# Patient Record
Sex: Female | Born: 1945 | Race: White | Hispanic: No | Marital: Married | Smoking: Never smoker
Health system: Southern US, Community
[De-identification: ages and names within clinical notes are randomized; demographics above are authoritative.]

## PROBLEM LIST (undated history)

## (undated) DIAGNOSIS — E11621 Type 2 diabetes mellitus with foot ulcer: Secondary | ICD-10-CM

## (undated) DIAGNOSIS — C44300 Unspecified malignant neoplasm of skin of unspecified part of face: Secondary | ICD-10-CM

## (undated) DIAGNOSIS — Z9289 Personal history of other medical treatment: Secondary | ICD-10-CM

## (undated) DIAGNOSIS — F329 Major depressive disorder, single episode, unspecified: Secondary | ICD-10-CM

## (undated) DIAGNOSIS — E669 Obesity, unspecified: Secondary | ICD-10-CM

## (undated) DIAGNOSIS — Z9889 Other specified postprocedural states: Secondary | ICD-10-CM

## (undated) DIAGNOSIS — I2510 Atherosclerotic heart disease of native coronary artery without angina pectoris: Secondary | ICD-10-CM

## (undated) DIAGNOSIS — I10 Essential (primary) hypertension: Secondary | ICD-10-CM

## (undated) DIAGNOSIS — I4891 Unspecified atrial fibrillation: Secondary | ICD-10-CM

## (undated) DIAGNOSIS — R4182 Altered mental status, unspecified: Secondary | ICD-10-CM

## (undated) DIAGNOSIS — L97509 Non-pressure chronic ulcer of other part of unspecified foot with unspecified severity: Secondary | ICD-10-CM

## (undated) DIAGNOSIS — E119 Type 2 diabetes mellitus without complications: Secondary | ICD-10-CM

## (undated) DIAGNOSIS — F419 Anxiety disorder, unspecified: Secondary | ICD-10-CM

## (undated) DIAGNOSIS — I509 Heart failure, unspecified: Secondary | ICD-10-CM

## (undated) DIAGNOSIS — G4730 Sleep apnea, unspecified: Secondary | ICD-10-CM

## (undated) DIAGNOSIS — I350 Nonrheumatic aortic (valve) stenosis: Secondary | ICD-10-CM

## (undated) DIAGNOSIS — G629 Polyneuropathy, unspecified: Secondary | ICD-10-CM

## (undated) DIAGNOSIS — E785 Hyperlipidemia, unspecified: Secondary | ICD-10-CM

## (undated) DIAGNOSIS — F32A Depression, unspecified: Secondary | ICD-10-CM

## (undated) DIAGNOSIS — E7800 Pure hypercholesterolemia, unspecified: Secondary | ICD-10-CM

## (undated) HISTORY — DX: Sleep apnea, unspecified: G47.30

## (undated) HISTORY — PX: TENOTOMY: SHX397

## (undated) HISTORY — DX: Anxiety disorder, unspecified: F41.9

## (undated) HISTORY — DX: Unspecified malignant neoplasm of skin of unspecified part of face: C44.300

## (undated) HISTORY — DX: Polyneuropathy, unspecified: G62.9

## (undated) HISTORY — DX: Atherosclerotic heart disease of native coronary artery without angina pectoris: I25.10

## (undated) HISTORY — PX: SKIN CANCER EXCISION: SHX779

## (undated) HISTORY — DX: Nonrheumatic aortic (valve) stenosis: I35.0

## (undated) HISTORY — DX: Non-pressure chronic ulcer of other part of unspecified foot with unspecified severity: L97.509

## (undated) HISTORY — DX: Type 2 diabetes mellitus with foot ulcer: E11.621

## (undated) HISTORY — DX: Personal history of other medical treatment: Z92.89

## (undated) HISTORY — DX: Other specified postprocedural states: Z98.890

## (undated) HISTORY — DX: Depression, unspecified: F32.A

## (undated) HISTORY — DX: Hyperlipidemia, unspecified: E78.5

## (undated) HISTORY — DX: Altered mental status, unspecified: R41.82

## (undated) HISTORY — DX: Major depressive disorder, single episode, unspecified: F32.9

---

## 1999-07-02 ENCOUNTER — Other Ambulatory Visit: Admission: RE | Admit: 1999-07-02 | Discharge: 1999-07-02 | Payer: Self-pay | Admitting: *Deleted

## 1999-10-29 ENCOUNTER — Encounter (INDEPENDENT_AMBULATORY_CARE_PROVIDER_SITE_OTHER): Payer: Self-pay | Admitting: Specialist

## 1999-10-29 ENCOUNTER — Encounter: Payer: Self-pay | Admitting: Gastroenterology

## 1999-10-29 ENCOUNTER — Ambulatory Visit (HOSPITAL_COMMUNITY): Admission: RE | Admit: 1999-10-29 | Discharge: 1999-10-29 | Payer: Self-pay | Admitting: Gastroenterology

## 2003-01-04 ENCOUNTER — Other Ambulatory Visit: Admission: RE | Admit: 2003-01-04 | Discharge: 2003-01-04 | Payer: Self-pay | Admitting: Obstetrics and Gynecology

## 2003-09-13 ENCOUNTER — Encounter (INDEPENDENT_AMBULATORY_CARE_PROVIDER_SITE_OTHER): Payer: Self-pay | Admitting: Cardiology

## 2003-09-13 ENCOUNTER — Ambulatory Visit: Admission: RE | Admit: 2003-09-13 | Discharge: 2003-09-13 | Payer: Self-pay | Admitting: Cardiology

## 2004-02-22 ENCOUNTER — Ambulatory Visit (HOSPITAL_COMMUNITY): Admission: RE | Admit: 2004-02-22 | Discharge: 2004-02-22 | Payer: Self-pay | Admitting: Cardiology

## 2005-03-13 ENCOUNTER — Inpatient Hospital Stay (HOSPITAL_COMMUNITY): Admission: AD | Admit: 2005-03-13 | Discharge: 2005-03-17 | Payer: Self-pay | Admitting: Cardiology

## 2008-02-28 ENCOUNTER — Ambulatory Visit (HOSPITAL_BASED_OUTPATIENT_CLINIC_OR_DEPARTMENT_OTHER): Admission: RE | Admit: 2008-02-28 | Discharge: 2008-02-28 | Payer: Self-pay | Admitting: Orthopedic Surgery

## 2010-05-27 ENCOUNTER — Encounter (INDEPENDENT_AMBULATORY_CARE_PROVIDER_SITE_OTHER): Payer: Self-pay | Admitting: *Deleted

## 2010-07-11 ENCOUNTER — Encounter (INDEPENDENT_AMBULATORY_CARE_PROVIDER_SITE_OTHER): Payer: Self-pay | Admitting: *Deleted

## 2010-07-15 ENCOUNTER — Encounter (INDEPENDENT_AMBULATORY_CARE_PROVIDER_SITE_OTHER): Payer: Self-pay | Admitting: *Deleted

## 2010-07-15 ENCOUNTER — Ambulatory Visit: Payer: Self-pay | Admitting: Gastroenterology

## 2010-07-29 ENCOUNTER — Ambulatory Visit: Payer: Self-pay | Admitting: Gastroenterology

## 2010-07-31 ENCOUNTER — Encounter: Payer: Self-pay | Admitting: Gastroenterology

## 2010-10-14 NOTE — Letter (Signed)
Summary: Moviprep Instructions  [facility]  [address]   [place], [place] [address]   Phone: [phone]  Fax: [phone]       ADILENNE AHLES    1946/01/18    MRN: [id number]        Procedure Day Sudie Grumbling: Tuesday, 07-29-10     Arrival Time: 8:00 a.m.     Procedure Time: 9:00 a.m.     Location of Procedure:                    x   [facility] (4th Floor)   [place]   Starting 5 days prior to your procedure 07-24-10 do not eat nuts, seeds, popcorn, corn, beans, peas,  salads, or any raw vegetables.  Do not take any fiber supplements (e.g. Metamucil, Citrucel, and Benefiber).  THE DAY BEFORE YOUR PROCEDURE         DATE:  07-28-10   DAY: Monday  1.  Drink clear liquids the entire day-NO SOLID FOOD  2.  Do not drink anything colored red or purple.  Avoid juices with pulp.  No orange juice.  3.  Drink at least 64 oz. (8 glasses) of fluid/clear liquids during the day to prevent dehydration and help the prep work efficiently.  CLEAR LIQUIDS INCLUDE: Water Jello Ice Popsicles Tea (sugar ok, no milk/cream) Powdered fruit flavored drinks Coffee (sugar ok, no milk/cream) Gatorade Juice: apple, white grape, white cranberry  Lemonade Clear bullion, consomm, broth Carbonated beverages (any kind) Strained chicken noodle soup Hard Candy                             4.  In the morning, mix first dose of MoviPrep solution:    Empty 1 Pouch A and 1 Pouch B into the disposable container    Add lukewarm drinking water to the top line of the container. Mix to dissolve    Refrigerate (mixed solution should be used within 24 hrs)  5.  Begin drinking the prep at 5:00 p.m. The MoviPrep container is divided by 4 marks.   Every 15 minutes drink the solution down to the next mark (approximately 64 oz) until the full liter is complete.   6.  Follow completed prep with 16 oz of clear liquid of your choice (Nothing red or  purple).  Continue to drink clear liquids until bedtime.  7.  Before going to bed, mix second dose of MoviPrep solution:    Empty 1 Pouch A and 1 Pouch B into the disposable container    Add lukewarm drinking water to the top line of the container. Mix to dissolve    Refrigerate  THE DAY OF YOUR PROCEDURE      DATE: 07-29-10  DAY: Tuesday  Beginning at 4:00 a.m. (5 hours before procedure):         1. Every 15 minutes, drink the solution down to the next mark (approx 8 oz) until the full liter is complete.  2. Follow completed prep with 16 oz. of clear liquid of your choice.    3. You may drink clear liquids until 7:00 a.m. (2 HOURS BEFORE PROCEDURE).   MEDICATION INSTRUCTIONS  Unless otherwise instructed, you should take regular prescription medications with a small sip of water   as early as possible the morning of your procedure.  Diabetic patients - see separate instructions.   Additional medication instructions: Hold Diovan the morning  of procedure.         OTHER INSTRUCTIONS  You will need a responsible adult at least 64 years of age to accompany you and drive you home.   This person must remain in the waiting room during your procedure.  Wear loose fitting clothing that is easily removed.  Leave jewelry and other valuables at home.  However, you may wish to bring a book to read or  an iPod/MP3 player to listen to music as you wait for your procedure to start.  Remove all body piercing jewelry and leave at home.  Total time from sign-in until discharge is approximately 2-3 hours.  You should go home directly after your procedure and rest.  You can resume normal activities the  day after your procedure.  The day of your procedure you should not:   Drive   Make legal decisions   Operate machinery   Drink alcohol   Return to work  You will receive specific instructions about eating, activities and medications before you leave.    The above  instructions have been reviewed and explained to me by   Emerson Monte RN  July 15, 2010 9:56 AM     I fully understand and can verbalize these instructions _____________________________ Date _________

## 2010-10-14 NOTE — Procedures (Signed)
Summary: Colon/Marguetta Windish Sindy Guadeloupe MD  Colon/Griselle Rufer Sindy Guadeloupe MD   Imported By: Bubba Hales 08/08/2010 09:48:28  _____________________________________________________________________  External Attachment:    Type:   Image     Comment:   External Document

## 2010-10-14 NOTE — Miscellaneous (Signed)
Summary: [facility] Previsit/prep  Clinical Lists Changes  Medications: Added new medication of MOVIPREP 100 GM  SOLR (PEG-KCL-NACL-NASULF-NA ASC-C) As per prep instructions. - Signed Rx of MOVIPREP 100 GM  SOLR (PEG-KCL-NACL-NASULF-NA ASC-C) As per prep instructions.;  #1 x 0;  Signed;  Entered by: Emerson Monte RN;  Authorized by: Ladene Artist MD [facility];  Method used: Electronically to [facility] [place], [place], [place]  [address], Ph: [phone], Fax: [phone] Observations: Added new observation of NKA: T (07/15/2010 9:08)    Prescriptions: MOVIPREP 100 GM  SOLR (PEG-KCL-NACL-NASULF-NA ASC-C) As per prep instructions.  #1 x 0   Entered by:   Emerson Monte RN   Authorized by:   Ladene Artist MD [facility]   Signed by:   Emerson Monte RN on 07/15/2010   Method used:   Electronically to        [facility] (retail)       [address] [place], [place]  [address]       Ph: [phone]       Fax: [phone]   RxID:   [id number]

## 2010-10-14 NOTE — Procedures (Signed)
Summary: Colonoscopy  Patient: Jenny Glover Note: All result statuses are Final unless otherwise noted.  Tests: (1) Colonoscopy (COL)   COL Colonoscopy           [address]     [place], [place]  [address]           COLONOSCOPY PROCEDURE REPORT           PATIENT:  Jenny, Glover  MR#:  [id number]     BIRTHDATE:  06/17/1946, 69 yrs. old  GENDER:  female     ENDOSCOPIST:  Norberto Sorenson T. 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  Digital rectal exam was performed and     revealed no abnormalities.   The LB PCF-[id number] endoscope     was introduced through the anus and advanced to the cecum, which     was identified by both the appendix and ileocecal valve, without     limitations.  The quality of the prep was good, using MoviPrep.     The instrument was then slowly withdrawn as the colon was fully     examined.     <<PROCEDUREIMAGES>>     FINDINGS:  Two polyps were found in the ascending colon. They were     5 - 6 mm in size. Polyps were snared without cautery. Retrieval     was successful. A sessile polyp was found in the mid transverse     colon. It was 5 mm in size. Polyp was snared without cautery.     Retrieval was successful.  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 It was 3 mm and non-bleeding.  This was     otherwise a normal examination of the colon.   Retroflexed views     in the rectum revealed no abnormalities. The time to cecum =  2.75     minutes. The scope was then withdrawn  (time =  9.5  min) from the     patient and the procedure completed.           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 Fuller Plan, MD, [facility]           CC: Reynold Bowen, MD  n.     eSIGNED:   Lourie Retz T. Maddyx Wieck at 07/29/2010 09:25 AM           Jenny Glover, [id number]  Note: An exclamation mark (!) indicates a result that was not dispersed into the flowsheet. Document Creation Date: 07/29/2010 9:26 AM _______________________________________________________________________  (1) Order result status: Final Collection or observation date-time: 07/29/2010 09:19 Requested date-time:  Receipt date-time:  Reported date-time:  Referring Physician:   Ordering Physician: Lucio Edward [phone]) Specimen Source:  Source: Jenny Glover Order Number: [id number] Lab site:   Appended Document: Colonoscopy     Procedures Next Due Date:    Colonoscopy: 07/2013

## 2010-10-14 NOTE — Letter (Signed)
Summary: Diabetic Instructions  [facility]  [place],  [address]   Phone: [phone]  Fax: [phone]    ABBIEGALE ALARID 03-Dec-1945 MRN: [id number]     _ x _   INSULIN (SHORT ACTING) MEDICATION INSTRUCTIONS (Regular, Humulog, Novolog)   The day before your procedure:   Do not take your evening dose   The day of your procedure:   Do not take your morning dose

## 2010-10-14 NOTE — Letter (Signed)
Summary: Pre Visit Letter Revised  [facility]  [place], [place] [address]   Phone: [phone]  Fax: [phone]        05/27/2010 MRN: [id number] Jenny Glover [place], [place]  [address]             Procedure Date:  07-29-10   Welcome to the Gastroenterology Division at [facility].    You are scheduled to see a nurse for your pre-procedure visit on 07-15-10 at 9:00a.m. on the 3rd floor at [facility], [place] [employer].  We ask that you try to arrive at our office 15 minutes prior to your appointment time to allow for check-in.  Please take a minute to review the attached form.  If you answer "Yes" to one or more of the questions on the first page, we ask that you call the person listed at your earliest opportunity.  If you answer "No" to all of the questions, please complete the rest of the form and bring it to your appointment.    Your nurse visit will consist of discussing your medical and surgical history, your immediate family medical history, and your medications.   If you are unable to list all of your medications on the form, please bring the medication bottles to your appointment and we will list them.  We will need to be aware of both prescribed and over the counter drugs.  We will need to know exact dosage information as well.    Please be prepared to read and sign documents such as consent forms, a financial agreement, and acknowledgement forms.  If necessary, and with your consent, a friend or relative is welcome to sit-in on the nurse visit with you.  Please bring your insurance card so that we may make a copy of it.  If your insurance requires a referral to see a specialist, please bring your referral form from your primary care physician.  No co-pay is required for this nurse visit.     If you cannot keep your appointment, please call [phone] to cancel or reschedule prior to your appointment date.  This allows [place]  the opportunity to schedule an appointment for another patient in need of care.    Thank you for choosing [facility] for your medical needs.  We appreciate the opportunity to care for you.  Please visit [place] at our website  to learn more about our practice.  Sincerely, The Gastroenterology Division

## 2010-10-14 NOTE — Letter (Signed)
Summary: Patient Notice- Polyp Results  [facility]  [place], [place] [address]   Phone: [phone]  Fax: [phone]        July 31, 2010 MRN: [id number]    Jenny Glover [address], [place]  [address]    Dear Ms. Guallpa,  I am pleased to inform you that the colon polyp(s) removed during your recent colonoscopy was (were) found to be benign (no cancer detected) upon pathologic examination.  I recommend you have a repeat colonoscopy examination in 3 years to look for recurrent polyps, as having colon polyps increases your risk for having recurrent polyps or even colon cancer in the future.  Should you develop new or worsening symptoms of abdominal pain, bowel habit changes or bleeding from the rectum or bowels, please schedule an evaluation with either your primary care physician or with me.  Continue treatment plan as outlined the day of your exam.  Please call us if you are having persistent problems or have questions about your condition that have not been fully answered at this time.  Sincerely,  Ladene Artist MD [facility]  This letter has been electronically signed by your physician.  Appended Document: Patient Notice- Polyp Results Letter mailed

## 2010-11-25 LAB — GLUCOSE, CAPILLARY: Glucose-Capillary: 410 mg/dL — ABNORMAL HIGH (ref 70–99)

## 2011-01-27 NOTE — Op Note (Signed)
NAMEENVI, BIBEY NO.:  [id number]   MEDICAL RECORD NO.:  [id number]          PATIENT TYPE:  AMB   LOCATION:  [place]                          FACILITY:  [facility]   PHYSICIAN:  Weber Cooks, M.D.     DATE OF BIRTH:  [birth year]   DATE OF PROCEDURE:  DATE OF DISCHARGE:                               OPERATIVE REPORT   PREOPERATIVE DIAGNOSIS:  Left second mallet toe with distal ulcer.   POSTOPERATIVE DIAGNOSIS:  Left second mallet toe with distal ulcer.   OPERATION:  Left second toe FDL tenotomy.   ANESTHESIA:  Ankle block with sedation.   SURGEON:  Weber Cooks, MD   ASSISTANT:  Erskine Emery, PA   ESTIMATED BLOOD LOSS:  Minimal.   COMPLICATIONS:  None.   DISPOSITION:  Stable to the PR.   INDICATIONS:  This is a 65 year old female with an ulcer at the tip of  her left second toe.  She has diabetes and peripheral neuropathy.  She  was consented the above procedure. All risks including infection, nerve  or vessel injury, recurrence of the ulcer. recurrence of deformity,  possibility of future osteomyelitis, and toe amputation were all  explained.  Questions were encouraged and answered.   OPERATION:  The patient was brought to the operating room and placed in  supine position.  After adequate ankle block with sedation was  administered as well as Ancef 1 g IV piggyback, left lower extremity was  then prepped and draped in a sterile manner.  Pneumatic tourniquet was  used.  A longitudinal incision on the plantar aspect of the left second  toe DIP joint was then made.  Dissection was carried down through skin.  Hemostasis was obtained.  The FDL tendon was then tenotomized as well as  the plantar plate of the DIP joint which was incised as well.  We then  calloused the toe by extending it and the toe was an excellent position.  The area was copiously irrigated with normal saline.  Skin was closed  with 4-0 nylon stitch.  Sterile dressing was applied.  Hard sole  shoes  was applied.  The patient was stable to PR.      Weber Cooks, M.D.  Electronically Signed    PB/MEDQ  D:  02/28/2008  T:  02/29/2008  Job:  [id number]

## 2011-01-30 NOTE — Op Note (Signed)
NAME:  Jenny Glover, Jenny Glover                          ACCOUNT NO.:  [id number]   MEDICAL RECORD NO.:  [id number]                   PATIENT TYPE:  OIB   LOCATION:  2861                                 FACILITY:  [facility]   PHYSICIAN:  Kerry Hough., M.D.         DATE OF BIRTH:  07-Nov-1945   DATE OF PROCEDURE:  02/22/2004  DATE OF DISCHARGE:  02/22/2004                                 OPERATIVE REPORT   PROCEDURE PERFORMED:  Cardioversion.   INDICATIONS FOR PROCEDURE:  Atrial fibrillation for cardioversion.   DESCRIPTION OF PROCEDURE:  The patient was n.p.o. after midnight.  Anesthesia was administered by Sharolyn Douglas, M.D. with 125 mg of  Pentothal.  Cardioversion was done with biphasic anterior posterior patches  with 120 watts with reversion to sinus rhythm after one shock.  The patient  tolerated the procedure well.   IMPRESSION:  Successful cardioversion of atrial fibrillation.                                               Kerry Hough., M.D.    WST/MEDQ  D:  02/22/2004  T:  02/23/2004  Job:  [id number]   cc:   Ishmael Holter. Forde Dandy, M.D.  [address]  [place]  [place] [address]  Fax: [phone]

## 2011-01-30 NOTE — Op Note (Signed)
NAMEDORALYN, Jenny Glover NO.:  [id number]   MEDICAL RECORD NO.:  [id number]          PATIENT TYPE:  INP   LOCATION:  [id number]                         FACILITY:  [facility]   PHYSICIAN:  Kerry Hough., M.D.DATE OF BIRTH:  March 01, 1946   DATE OF PROCEDURE:  03/16/2005  DATE OF DISCHARGE:                                 OPERATIVE REPORT   PROCEDURE:  Cardioversion.   INDICATIONS:  Recurrent atrial fibrillation.  She has been loaded on  Tikosyn.   DESCRIPTION OF PROCEDURE:  The patient was NPO after midnight and received  anesthesia by Dr. Roberts Gaudy with 175 mg of Pentothal.  Cardioversion was  done with synchronized biphasic cardioversion with 100 watts resulting in  conversion to sinus rhythm.  She tolerated it well.   IMPRESSION:  Successful cardioversion of atrial fibrillation.       WST/MEDQ  D:  03/16/2005  T:  03/16/2005  Job:  [id number]   cc:   Ishmael Holter. Forde Dandy, M.D.  [address]  [place]  [place] [address]  Fax: [phone]

## 2011-01-30 NOTE — Discharge Summary (Signed)
Jenny Glover, Jenny Glover NO.:  [id number]   MEDICAL RECORD NO.:  [id number]          PATIENT TYPE:  INP   LOCATION:  3742                         FACILITY:  [facility]   PHYSICIAN:  Kerry Hough., M.D.DATE OF BIRTH:  31-Aug-1946   DATE OF ADMISSION:  03/13/2005  DATE OF DISCHARGE:  03/17/2005                                 DISCHARGE SUMMARY   FINAL DIAGNOSES:  1.  Atrial fibrillation.      1.  Initiated on Tikosyn.      2.  Successful cardioversion.  2.  Hypertension.  3.  Diabetes mellitus.  4.  Obesity.  5.  Long-term treatment with Coumadin.  6.  Osteoarthritis.   PROCEDURES:  1.  Cardioversion.  2.  Adenosine Cardiolite scan.   HISTORY:  This 65 year old female has a long history of hypertension,  diabetes and obesity.  She was diagnosed with atrial fibrillation in  December of 2004.  At that point in time, she was placed on Coumadin and had  a cardioversion done in June of 2005 that resulted with the improvement in  fatigue and dyspnea at that time.  She went back into atrial fibrillation  some time between September and December of 2005.  We discussed possible  admission for antiarrhythmic therapy and she had been unwilling to consider  cardioversion up until now, but was admitted to institute antiarrhythmic  therapy and consider repeat cardioversion because of symptomatic atrial  fibrillation.  Please see the previously dictated history and physical for  remainder of the details.   HOSPITAL COURSE:  CBC was normal.  Chemistry panel showed a potassium of  4.2.  Magnesium level was normal.   EKG showed atrial fibrillation, incomplete right bundle branch block.   The patient was admitted to the hospital and was initiated on Tikosyn.  Her  QTc was monitored.  She had an adenosine Cardiolite scan done on March 16, 2005 that showed no ischemia.  We could not measure the ejection fraction  because of atrial fibrillation.  Later that day, she underwent  cardioversion  with reversion to sinus rhythm and tolerated it well.  She was observed  overnight and had no further prolongation of QTc on Tikosyn.  Education and  administrative paperwork were completed and she was discharged in stable  condition.  Instructions were given about medicines to avoid with Tikosyn.  She may return to work tomorrow.  Her diabetic control was good while in the  hospital.   DISCHARGE MEDICATIONS:  1.  Aspirin 81 mg daily.  2.  Diovan 320 mg daily.  3.  Glucophage 1000 mg b.i.d.  4.  Glucotrol XL 10 mg b.i.d.  5.  Lanoxin 0.25 mg daily.  6.  Lantus insulin 35 units in the morning.  7.  Lisinopril 30 mg daily.  8.  Toprol-XL 50 mg daily.  9.  Tikosyn 500 mcg twice daily.  10. Coumadin 5 mg daily except 7.5 mg on Monday and Friday.   Prescriptions were written for medication.   FOLLOWUP:  She is to call me for an appointment to be seen  in 2 weeks.       WST/MEDQ  D:  03/17/2005  T:  03/17/2005  Job:  [id number]   cc:   Ishmael Holter. Forde Dandy, M.D.  [address]  [place]  [place] [address]  Fax: [phone]

## 2011-06-11 LAB — POCT I-STAT, CHEM 8
BUN: 24 — ABNORMAL HIGH
Glucose, Bld: 336 — ABNORMAL HIGH
Potassium: 4.8

## 2011-06-11 LAB — APTT: aPTT: 40 — ABNORMAL HIGH

## 2011-10-08 ENCOUNTER — Other Ambulatory Visit: Payer: Self-pay | Admitting: Cardiology

## 2012-08-21 ENCOUNTER — Emergency Department (HOSPITAL_COMMUNITY): Payer: Medicare Other

## 2012-08-21 ENCOUNTER — Encounter (HOSPITAL_COMMUNITY): Payer: Self-pay | Admitting: *Deleted

## 2012-08-21 ENCOUNTER — Inpatient Hospital Stay (HOSPITAL_COMMUNITY)
Admission: EM | Admit: 2012-08-21 | Discharge: 2012-08-24 | DRG: 247 | Disposition: A | Discharge status: Home / Self Care | Payer: Medicare Other | Source: Ambulatory Visit | Attending: Endocrinology | Admitting: Endocrinology

## 2012-08-21 ENCOUNTER — Other Ambulatory Visit (HOSPITAL_COMMUNITY): Payer: Medicare Other

## 2012-08-21 DIAGNOSIS — I209 Angina pectoris, unspecified: Secondary | ICD-10-CM

## 2012-08-21 DIAGNOSIS — N281 Cyst of kidney, acquired: Secondary | ICD-10-CM | POA: Diagnosis not present

## 2012-08-21 DIAGNOSIS — N179 Acute kidney failure, unspecified: Secondary | ICD-10-CM | POA: Diagnosis not present

## 2012-08-21 DIAGNOSIS — E875 Hyperkalemia: Secondary | ICD-10-CM

## 2012-08-21 DIAGNOSIS — Z8249 Family history of ischemic heart disease and other diseases of the circulatory system: Secondary | ICD-10-CM

## 2012-08-21 DIAGNOSIS — E119 Type 2 diabetes mellitus without complications: Secondary | ICD-10-CM | POA: Diagnosis not present

## 2012-08-21 DIAGNOSIS — Z7982 Long term (current) use of aspirin: Secondary | ICD-10-CM

## 2012-08-21 DIAGNOSIS — R0602 Shortness of breath: Secondary | ICD-10-CM | POA: Diagnosis not present

## 2012-08-21 DIAGNOSIS — I482 Chronic atrial fibrillation, unspecified: Secondary | ICD-10-CM | POA: Diagnosis present

## 2012-08-21 DIAGNOSIS — E1159 Type 2 diabetes mellitus with other circulatory complications: Secondary | ICD-10-CM | POA: Diagnosis present

## 2012-08-21 DIAGNOSIS — I10 Essential (primary) hypertension: Secondary | ICD-10-CM

## 2012-08-21 DIAGNOSIS — I480 Paroxysmal atrial fibrillation: Secondary | ICD-10-CM

## 2012-08-21 DIAGNOSIS — I4891 Unspecified atrial fibrillation: Secondary | ICD-10-CM

## 2012-08-21 DIAGNOSIS — E7800 Pure hypercholesterolemia, unspecified: Secondary | ICD-10-CM | POA: Diagnosis present

## 2012-08-21 DIAGNOSIS — Z79899 Other long term (current) drug therapy: Secondary | ICD-10-CM

## 2012-08-21 DIAGNOSIS — Z6841 Body Mass Index (BMI) 40.0 and over, adult: Secondary | ICD-10-CM

## 2012-08-21 DIAGNOSIS — I798 Other disorders of arteries, arterioles and capillaries in diseases classified elsewhere: Secondary | ICD-10-CM | POA: Diagnosis present

## 2012-08-21 DIAGNOSIS — I119 Hypertensive heart disease without heart failure: Secondary | ICD-10-CM | POA: Diagnosis present

## 2012-08-21 DIAGNOSIS — E1151 Type 2 diabetes mellitus with diabetic peripheral angiopathy without gangrene: Secondary | ICD-10-CM

## 2012-08-21 DIAGNOSIS — Z794 Long term (current) use of insulin: Secondary | ICD-10-CM

## 2012-08-21 DIAGNOSIS — I214 Non-ST elevation (NSTEMI) myocardial infarction: Secondary | ICD-10-CM

## 2012-08-21 DIAGNOSIS — E1129 Type 2 diabetes mellitus with other diabetic kidney complication: Secondary | ICD-10-CM | POA: Diagnosis present

## 2012-08-21 DIAGNOSIS — N1330 Unspecified hydronephrosis: Secondary | ICD-10-CM | POA: Diagnosis present

## 2012-08-21 DIAGNOSIS — R072 Precordial pain: Secondary | ICD-10-CM | POA: Diagnosis not present

## 2012-08-21 DIAGNOSIS — Z9119 Patient's noncompliance with other medical treatment and regimen: Secondary | ICD-10-CM

## 2012-08-21 DIAGNOSIS — E785 Hyperlipidemia, unspecified: Secondary | ICD-10-CM | POA: Diagnosis present

## 2012-08-21 DIAGNOSIS — Z7901 Long term (current) use of anticoagulants: Secondary | ICD-10-CM

## 2012-08-21 DIAGNOSIS — R079 Chest pain, unspecified: Secondary | ICD-10-CM

## 2012-08-21 DIAGNOSIS — Z23 Encounter for immunization: Secondary | ICD-10-CM

## 2012-08-21 DIAGNOSIS — Z91199 Patient's noncompliance with other medical treatment and regimen due to unspecified reason: Secondary | ICD-10-CM

## 2012-08-21 DIAGNOSIS — Z955 Presence of coronary angioplasty implant and graft: Secondary | ICD-10-CM

## 2012-08-21 DIAGNOSIS — Z7902 Long term (current) use of antithrombotics/antiplatelets: Secondary | ICD-10-CM

## 2012-08-21 DIAGNOSIS — I200 Unstable angina: Secondary | ICD-10-CM

## 2012-08-21 DIAGNOSIS — R739 Hyperglycemia, unspecified: Secondary | ICD-10-CM

## 2012-08-21 DIAGNOSIS — N058 Unspecified nephritic syndrome with other morphologic changes: Secondary | ICD-10-CM | POA: Diagnosis present

## 2012-08-21 DIAGNOSIS — I2510 Atherosclerotic heart disease of native coronary artery without angina pectoris: Secondary | ICD-10-CM

## 2012-08-21 HISTORY — DX: Obesity, unspecified: E66.9

## 2012-08-21 HISTORY — DX: Essential (primary) hypertension: I10

## 2012-08-21 HISTORY — DX: Unspecified atrial fibrillation: I48.91

## 2012-08-21 HISTORY — DX: Pure hypercholesterolemia, unspecified: E78.00

## 2012-08-21 HISTORY — DX: Type 2 diabetes mellitus without complications: E11.9

## 2012-08-21 LAB — CBC
HCT: 41.8 % (ref 36.0–46.0)
MCHC: 32.3 g/dL (ref 30.0–36.0)
MCV: 92.7 fL (ref 78.0–100.0)
Platelets: 254 10*3/uL (ref 150–400)
RDW: 13.5 % (ref 11.5–15.5)
WBC: 11.7 10*3/uL — ABNORMAL HIGH (ref 4.0–10.5)

## 2012-08-21 LAB — BASIC METABOLIC PANEL
CO2: 26 mEq/L (ref 19–32)
Calcium: 9.8 mg/dL (ref 8.4–10.5)
Creatinine, Ser: 1.19 mg/dL — ABNORMAL HIGH (ref 0.50–1.10)
Glucose, Bld: 439 mg/dL — ABNORMAL HIGH (ref 70–99)

## 2012-08-21 LAB — D-DIMER, QUANTITATIVE: D-Dimer, Quant: 0.61 ug/mL-FEU — ABNORMAL HIGH (ref 0.00–0.48)

## 2012-08-21 LAB — CK TOTAL AND CKMB (NOT AT ARMC)
CK, MB: 4.6 ng/mL — ABNORMAL HIGH (ref 0.3–4.0)
Relative Index: INVALID (ref 0.0–2.5)
Total CK: 63 U/L (ref 7–177)

## 2012-08-21 LAB — PRO B NATRIURETIC PEPTIDE: Pro B Natriuretic peptide (BNP): 573.3 pg/mL — ABNORMAL HIGH (ref 0–125)

## 2012-08-21 LAB — TROPONIN I: Troponin I: <0.3 ng/mL (ref <0.30)

## 2012-08-21 MED ORDER — INSULIN ASPART 100 UNIT/ML ~~LOC~~ SOLN
0.0000 [IU] | Freq: Three times a day (TID) | SUBCUTANEOUS | Status: DC
  Administered 2012-08-22: 8 [IU] via SUBCUTANEOUS

## 2012-08-21 MED ORDER — SODIUM CHLORIDE 0.9 % IV SOLN: 250.0000 mL | INTRAVENOUS | Status: DC | PRN

## 2012-08-21 MED ORDER — INSULIN GLARGINE 100 UNIT/ML ~~LOC~~ SOLN
10.0000 [IU] | Freq: Every day | SUBCUTANEOUS | Status: DC
  Administered 2012-08-21: 10 [IU] via SUBCUTANEOUS

## 2012-08-21 MED ORDER — IOHEXOL 350 MG/ML SOLN
100.0000 mL | Freq: Once | INTRAVENOUS | Status: AC | PRN
  Administered 2012-08-21: 100 mL via INTRAVENOUS

## 2012-08-21 MED ORDER — ASPIRIN 325 MG PO TABS
325.0000 mg | ORAL_TABLET | Freq: Every day | ORAL | Status: DC
  Filled 2012-08-21: qty 1

## 2012-08-21 MED ORDER — ASPIRIN EC 325 MG PO TBEC
325.0000 mg | DELAYED_RELEASE_TABLET | Freq: Every day | ORAL | Status: DC
  Administered 2012-08-21 – 2012-08-22 (×2): 325 mg via ORAL
  Filled 2012-08-21: qty 1

## 2012-08-21 MED ORDER — INSULIN REGULAR HUMAN 100 UNIT/ML IJ SOLN: 8.0000 [IU] | Freq: Once | INTRAMUSCULAR | Status: DC

## 2012-08-21 MED ORDER — SODIUM CHLORIDE 0.9 % IJ SOLN
3.0000 mL | Freq: Two times a day (BID) | INTRAMUSCULAR | Status: DC
  Administered 2012-08-22: 3 mL via INTRAVENOUS

## 2012-08-21 MED ORDER — INFLUENZA VIRUS VACC SPLIT PF IM SUSP
0.5000 mL | INTRAMUSCULAR | Status: AC
  Filled 2012-08-21: qty 0.5

## 2012-08-21 MED ORDER — SODIUM CHLORIDE 0.9 % IJ SOLN: 3.0000 mL | INTRAMUSCULAR | Status: DC | PRN

## 2012-08-21 MED ORDER — INSULIN ASPART 100 UNIT/ML ~~LOC~~ SOLN
8.0000 [IU] | Freq: Once | SUBCUTANEOUS | Status: AC
  Administered 2012-08-21: 8 [IU] via SUBCUTANEOUS
  Filled 2012-08-21: qty 1

## 2012-08-21 MED ORDER — PNEUMOCOCCAL VAC POLYVALENT 25 MCG/0.5ML IJ INJ
0.5000 mL | INJECTION | INTRAMUSCULAR | Status: AC
  Filled 2012-08-21: qty 0.5

## 2012-08-21 MED ORDER — ENOXAPARIN SODIUM 120 MG/0.8ML ~~LOC~~ SOLN
120.0000 mg | Freq: Two times a day (BID) | SUBCUTANEOUS | Status: DC
  Administered 2012-08-21 – 2012-08-22 (×3): 120 mg via SUBCUTANEOUS
  Filled 2012-08-21 (×6): qty 0.8

## 2012-08-21 MED ORDER — METOPROLOL TARTRATE 25 MG PO TABS
25.0000 mg | ORAL_TABLET | Freq: Two times a day (BID) | ORAL | Status: DC
  Administered 2012-08-21 – 2012-08-23 (×4): 25 mg via ORAL
  Filled 2012-08-21 (×6): qty 1

## 2012-08-21 MED ORDER — SODIUM CHLORIDE 0.9 % IJ SOLN
3.0000 mL | Freq: Two times a day (BID) | INTRAMUSCULAR | Status: DC
  Administered 2012-08-21 – 2012-08-23 (×3): 3 mL via INTRAVENOUS

## 2012-08-21 MED ORDER — SODIUM CHLORIDE 0.9 % IV SOLN
INTRAVENOUS | Status: AC
  Administered 2012-08-21: 22:00:00 via INTRAVENOUS

## 2012-08-21 NOTE — ED Notes (Addendum)
Pt reports having mid dull chest pain that started last night and again this am. Having mild sob, reports jaw pain a couple of days ago also. ekg done at triage. Airway intact, no resp distress noted. Hypertensive at triage, bp 204/112, reports loosing her insurance and not taking bp meds x 1 year

## 2012-08-21 NOTE — ED Notes (Signed)
Called pharmacy about status of insulin. Stated that they would send it now.

## 2012-08-21 NOTE — ED Notes (Signed)
Pt transported back from CT.  

## 2012-08-21 NOTE — ED Notes (Signed)
Pt transported to CT ?

## 2012-08-21 NOTE — H&P (Signed)
[facility]          History and Physical    PCP: Reynold Bowen, MD , not seen in 1 1/2 years  No primary provider on file.   Chief Complaint:  Chest pressure  HPI: Jenny Glover is a 66 year old white female with past history of diabetes on insulin, paroxysmal atrial fibrillation status post multiple DC cardioversions, hypertension, dyslipidemia was in her usual state of health until yesterday evening. She was at church for her daughter's choir recital yesterday, when she noted some chest pressure which lasted a couple of minutes, then again this morning after breakfast she was watching TV and started experiencing chest pressure which was midsternal, nonradiating, associated with mild shortness of breath, no nausea vomiting or diaphoresis, this lasted for about 45 minutes, she tried to take a shower and calm herself down, then came to  with her husband. Upon evaluation the emergency room, initial blood pressure was elevated 214/112, EKG showed rapid atrial fibrillation, she also ended up having a CT angiogram of her chest because of a mildly positive d-dimer, which was negative for pulmonary embolism. She has been unable to take her medications regularly since she ran out of insurance after she lost her job.    Allergies:  No Known Allergies    Past Medical History  Diagnosis Date  . Atrial fibrillation     s/p DCCV 2005, 2006. Placed on Tikosyn 2006 (negative adenosine cardiolite 03/2005, could not measure EF).  . Hypertension   . Diabetes mellitus without complication   . Obesity   . High cholesterol     Past Surgical History  Procedure Date  . Tenotomy     Prior to Admission medications   Medication Sig Start Date End Date Taking? Authorizing Provider  aspirin 81 MG chewable tablet Chew 162 mg by mouth daily.   Yes Historical Provider, MD  insulin regular (NOVOLIN R,HUMULIN R) 100 units/mL injection Inject 500 Units into the skin 3 (three) times  daily before meals.   Yes Historical Provider, MD    Social History:  does not have a smoking history on file. She does not have any smokeless tobacco history on file. She reports that she does not drink alcohol or use illicit drugs.  History reviewed. No pertinent family history.  Review of Systems:  Constitutional: Denies fever, chills, diaphoresis, appetite change and fatigue.  HEENT: Denies photophobia, eye pain, redness, hearing loss, ear pain, congestion, sore throat, rhinorrhea, sneezing, mouth sores, trouble swallowing, neck pain, neck stiffness and tinnitus.   Respiratory: Denies SOB, DOE, cough, chest tightness,  and wheezing.   Cardiovascular: Denies chest pain, palpitations and leg swelling.  Gastrointestinal: Denies nausea, vomiting, abdominal pain, diarrhea, constipation, blood in stool and abdominal distention.  Genitourinary: Denies dysuria, urgency, frequency, hematuria, flank pain and difficulty urinating.  Musculoskeletal: Denies myalgias, back pain, joint swelling, arthralgias and gait problem.  Skin: Denies pallor, rash and wound.  Neurological: Denies dizziness, seizures, syncope, weakness, light-headedness, numbness and headaches.  Hematological: Denies adenopathy. Easy bruising, personal or family bleeding history  Psychiatric/Behavioral: Denies suicidal ideation, mood changes, confusion, nervousness, sleep disturbance and agitation   Physical Exam: Blood pressure 148/75, pulse 93, temperature 98.4 F (36.9 C), temperature source Oral, resp. rate 16, SpO2 96.00%.  Constitutional: She is oriented to person, place, and time. She appears well-developed and well-nourished. HENT: Head: Normocephalic and atraumatic, Eyes: Conjunctivae normal, EOM and lids are normal. Pupils are equal, round, and reactive to light.  Neck: Normal range of motion. Neck  supple. No tracheal deviation present. No mass present.  Cardiovascular: Regular rhythm and normal heart sounds.  tachycardia present. Exam reveals no gallop.  No murmur heard.  Pulmonary/Chest: Effort normal.  No respiratory distress. She has decreased BS,  no wheezes, no rhonchi. She has no rales.  Abdominal: Soft. Normal appearance and bowel sounds are normal. She exhibits no distension. There is no tenderness. There is no rebound and no CVA tenderness.  Musculoskeletal: Normal range of motion. She exhibits no edema and no tenderness.  Neurological: No localizing signs Skin: Skin is warm and dry. No abrasion and no rash noted.  Psychiatric: She has a normal mood and affect. Her speech is normal and behavior is normal  Labs on Admission:  Results for orders placed during the hospital encounter of 08/21/12 (from the past 48 hour(s))  CBC     Status: Abnormal   Collection Time   08/21/12  1:20 PM      Component Value Range Comment   WBC 11.7 (*) 4.0 - 10.5 K/uL    RBC 4.51  3.87 - 5.11 MIL/uL    Hemoglobin 13.5  12.0 - 15.0 g/dL    HCT 41.8  36.0 - 46.0 %    MCV 92.7  78.0 - 100.0 fL    MCH 29.9  26.0 - 34.0 pg    MCHC 32.3  30.0 - 36.0 g/dL    RDW 13.5  11.5 - 15.5 %    Platelets 254  150 - 400 K/uL   BASIC METABOLIC PANEL     Status: Abnormal   Collection Time   08/21/12  1:20 PM      Component Value Range Comment   Sodium 138  135 - 145 mEq/L    Potassium 5.3 (*) 3.5 - 5.1 mEq/L    Chloride 97  96 - 112 mEq/L    CO2 26  19 - 32 mEq/L    Glucose, Bld 439 (*) 70 - 99 mg/dL    BUN 28 (*) 6 - 23 mg/dL    Creatinine, Ser 1.19 (*) 0.50 - 1.10 mg/dL    Calcium 9.8  8.4 - 10.5 mg/dL    GFR calc non Af Amer 47 (*) >90 mL/min    GFR calc Af Amer 54 (*) >90 mL/min   PRO B NATRIURETIC PEPTIDE     Status: Abnormal   Collection Time   08/21/12  1:20 PM      Component Value Range Comment   Pro B Natriuretic peptide (BNP) 573.3 (*) 0 - 125 pg/mL   D-DIMER, QUANTITATIVE     Status: Abnormal   Collection Time   08/21/12  1:20 PM      Component Value Range Comment   D-Dimer, Quant 0.61 (*) 0.00 - 0.48  ug/mL-FEU   POCT I-STAT TROPONIN I     Status: Normal   Collection Time   08/21/12  1:36 PM      Component Value Range Comment   Troponin i, poc 0.01  0.00 - 0.08 ng/mL    Comment 3              Radiological Exams on Admission: Ct Abdomen Pelvis Wo Contrast  08/21/2012  *RADIOLOGY REPORT*  Clinical Data: 66 year old female with pain. Fluid attenuation structures in the upper left kidney - question cyst versus hydronephrosis.  CT ABDOMEN AND PELVIS WITHOUT CONTRAST  Technique:  Multidetector CT imaging of the abdomen and pelvis was performed following the standard protocol without intravenous contrast.  Comparison: 08/21/2012 chest  CT  Findings: A 3 cm parapelvic cyst within the mid - upper left kidney causes mild dilatation of the left upper pole renal collecting system. No other renal abnormalities are identified.  The liver, spleen, pancreas, gallbladder and adrenal glands are unremarkable.  Please note that parenchymal abnormalities may be missed as intravenous contrast was not administered. No free fluid, enlarged lymph nodes, biliary dilation or abdominal aortic aneurysm identified.  The bowel bladder are within normal limits. The uterus and adnexal regions are unremarkable.  No acute or suspicious bony abnormalities are identified. Multilevel degenerative disc disease and facet arthropathy noted. Superior endplate Schmorl's nodes of L1 and L2 are noted.  IMPRESSION: 3 cm parapelvic cyst causing mild relative obstruction of the left upper pole collecting system - probably of little clinical significance.  Lumbar spine degenerative changes with Schmorl's nodes.   Original Report Authenticated By: Margarette Canada, M.D.    Dg Chest 2 View  08/21/2012  *RADIOLOGY REPORT*  Clinical Data: Substernal chest pain and shortness of breath.  CHEST - 2 VIEW  Comparison: 03/13/2005.  Findings: The cardiac silhouette, mediastinal and hilar contours are within normal limits and stable.  There are bronchitic lung  changes with peribronchial thickening and increased interstitial markings.  No focal infiltrates, edema or effusions.  IMPRESSION: Bronchitic type lung changes suggesting bronchitis or interstitial pneumonitis.  No infiltrates or effusions.   Original Report Authenticated By: Marijo Sanes, M.D.    Ct Angio Chest Pe W/cm &/or Wo Cm  08/21/2012  *RADIOLOGY REPORT*  Clinical Data: 66 year old female with chest pain and shortness of breath.  CT ANGIOGRAPHY CHEST  Technique:  Multidetector CT imaging of the chest using the standard protocol during bolus administration of intravenous contrast. Multiplanar reconstructed images including MIPs were obtained and reviewed to evaluate the vascular anatomy.  Contrast: 133mL OMNIPAQUE IOHEXOL 350 MG/ML SOLN  Comparison: 08/21/2012 chest radiograph  Findings: This is a technically satisfactory study.  No pulmonary emboli are identified.  There is no evidence of thoracic aortic aneurysm.  Mild cardiomegaly and coronary artery calcifications are present.  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  There are no pleural or pericardial effusions. No enlarged lymph nodes are identified.  There is no evidence of airspace disease, consolidation, nodule, mass or endobronchial/endotracheal lesion.  Cysts versus hydronephrosis within the left upper pole noted. Consider further evaluation with CT or ultrasound.  No acute or suspicious bony abnormalities are identified.  IMPRESSION: No evidence of pulmonary emboli or thoracic aortic aneurysm.  Left upper renal cysts versus left hydronephrosis.  Recommend further evaluation with CT or ultrasound.  Coronary artery disease.   Original Report Authenticated By: Margarette Canada, M.D.     Assessment/Plan  1. Chest pain : Concerning for unstable angina  Admit to telemetry bed will cycle cardiac enzymes  Start aspirin 325 mg, add metoprolol,  Lovenox  Check a 2-D echocardiogram Cardiology consulted, Ischemia evaluation per cards  2.  [id number] hemoglobin A1c, start Lantus 10 units at bedtime and NovoLog sliding scale   3. hypertension we'll start metoprolol and monitor, ACE inhibitor once creatinine improved  4. atrial flutter/fibrillation with rapid ventricular response: Rate improved now, start metoprolol, stopped Coumadin about 2 years ago due to insurance/cost concerns with lab draws, will defer to cardiology , Chads2 score is 2 atleast  DVT prophylaxis currently on therapeutic anticoagulation CODE STATUS full   family communication discussed with patient, her husband and son at bedside Disposition inpatient, home in 1-2 days   Time Spent on Admission:  35min  Jenny Glover [facility] Pager: [phone] 08/21/2012, 5:29 PM

## 2012-08-21 NOTE — Consult Note (Signed)
Cardiology Consultation Note  Patient ID: Jenny Glover MRN: [id number], DOB: 08/24/1946 Date of Encounter: 08/21/2012, 5:33 PM Primary Physician: Reynold Bowen MD Primary Cardiologist: Tollie Eth MD  Chief Complaint: chest pain  HPI: Jenny Glover is a 66 y/o F with history of afib s/p multiple cardiversions, negative cardiolite 2006, HTN, DM, HL, medication noncompliance due to financial issues who presented to [facility] today with complaints of chest tightness. She states she had minor symptoms last night. Today she complained of mid sternal chest tightness associated with dyspnea. This lasted for 15-20 minutes and then improved. Not really aware of palpitations but does note that HR faster than usual. As noted has a history of atrial fibrillation. Has stopped taking medications due to cost except for insulin and ASA.  EKG showed atrial flutter RVR 115bpm with inferior changes. ??Glu 439, K 5.3 (possibly pseudohyperK related to glucose), BUN/Cr 29/1.19, pBNP 573, WBC 11k. CXR: bronchitic type changes ?bronchitis versus interstitial pneumonitis. CT angio done for mildly elevated d-dimer showed no PE, but possible L upper renal cysts versus hydronephrosis, recommend f/u CT/US.  Past Medical History  Diagnosis Date  . Atrial fibrillation     s/p DCCV 2005, 2006. Placed on Tikosyn 2006 (negative adenosine cardiolite 03/2005, could not measure EF).  . Hypertension   . Diabetes mellitus without complication   . Obesity   . High cholesterol      Most Recent Cardiac Studies: 2006 Nuclear Stress Test Impression  No perfusion defects are seen allowing for breast attenuation artifact.  Gating was not performed due to arrhythmia.    Surgical History:  Past Surgical History  Procedure Date  . Tenotomy      Home Meds: Prior to Admission medications   Medication Sig Start Date End Date Taking? Authorizing Provider  aspirin 81 MG chewable tablet Chew 162 mg by mouth daily.   Yes  Historical Provider, MD  insulin regular (NOVOLIN R,HUMULIN R) 100 units/mL injection Inject 500 Units into the skin 3 (three) times daily before meals.   Yes Historical Provider, MD    Allergies: No Known Allergies  History   Social History  . Marital Status: Married    Spouse Name: N/A    Number of Children: N/A  . Years of Education: N/A   Occupational History  . retired Marine scientist    Social History Main Topics  . Smoking status: Never Smoker   . Smokeless tobacco: Not on file  . Alcohol Use: No  . Drug Use: No  . Sexually Active: Not on file   Other Topics Concern  . Not on file   Social History Narrative  . No narrative on file     Family history: father died in 35s with MI.  Review of Systems: General: negative for chills, fever, night sweats or weight changes.  Cardiovascular: negative for chest pain, edema, orthopnea, palpitations, paroxysmal nocturnal dyspnea, shortness of breath or dyspnea on exertion Dermatological: negative for rash Respiratory: negative for cough or wheezing Urologic: negative for hematuria Abdominal: negative for nausea, vomiting, diarrhea, bright red blood per rectum, melena, or hematemesis Neurologic: negative for visual changes, syncope, or dizziness All other systems reviewed and are otherwise negative except as noted above.  Labs:   Lab Results  Component Value Date   WBC 11.7* 08/21/2012   HGB 13.5 08/21/2012   HCT 41.8 08/21/2012   MCV 92.7 08/21/2012   PLT 254 08/21/2012     Lab 08/21/12 1320  NA 138  K 5.3*  CL 97  CO2 26  BUN 28*  CREATININE 1.19*  CALCIUM 9.8  PROT --  BILITOT --  ALKPHOS --  ALT --  AST --  GLUCOSE 439*   Troponin 0.61 Lab Results  Component Value Date   DDIMER 0.61* 08/21/2012    Radiology/Studies:  Dg Chest 2 View 08/21/2012  *RADIOLOGY REPORT*  Clinical Data: Substernal chest pain and shortness of breath.  CHEST - 2 VIEW  Comparison: 03/13/2005.  Findings: The cardiac silhouette, mediastinal  and hilar contours are within normal limits and stable.  There are bronchitic lung changes with peribronchial thickening and increased interstitial markings.  No focal infiltrates, edema or effusions.  IMPRESSION: Bronchitic type lung changes suggesting bronchitis or interstitial pneumonitis.  No infiltrates or effusions.   Original Report Authenticated By: Marijo Sanes, M.D.    Ct Angio Chest Pe W/cm &/or Wo Cm 08/21/2012  *RADIOLOGY REPORT*  Clinical Data: 66 year old female with chest pain and shortness of breath.  CT ANGIOGRAPHY CHEST  Technique:  Multidetector CT imaging of the chest using the standard protocol during bolus administration of intravenous contrast. Multiplanar reconstructed images including MIPs were obtained and reviewed to evaluate the vascular anatomy.  Contrast: 1109mL OMNIPAQUE IOHEXOL 350 MG/ML SOLN  Comparison: 08/21/2012 chest radiograph  Findings: This is a technically satisfactory study.  No pulmonary emboli are identified.  There is no evidence of thoracic aortic aneurysm.  Mild cardiomegaly and coronary artery calcifications are present.  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  There are no pleural or pericardial effusions. No enlarged lymph nodes are identified.  There is no evidence of airspace disease, consolidation, nodule, mass or endobronchial/endotracheal lesion.  Cysts versus hydronephrosis within the left upper pole noted. Consider further evaluation with CT or ultrasound.  No acute or suspicious bony abnormalities are identified.  IMPRESSION: No evidence of pulmonary emboli or thoracic aortic aneurysm.  Left upper renal cysts versus left hydronephrosis.  Recommend further evaluation with CT or ultrasound.  Coronary artery disease.   Original Report Authenticated By: Margarette Canada, M.D.     EKG: atrial flutter 115bpm inferior ST changes, right axis deviation, no prior to compare to  Physical Exam: Blood pressure 167/75, pulse 96, temperature 98.4 F (36.9 C),  temperature source Oral, resp. rate 18, SpO2 96.00%. General: Well developed, obese, in no acute distress. Head: Normocephalic, atraumatic, sclera non-icteric, no xanthomas, nares are without discharge.  Neck: Negative for carotid bruits. JVD not elevated. Lungs: Clear bilaterally to auscultation without wheezes, rales, or rhonchi. Breathing is unlabored. Heart: IRRR,tachy, with S1 S2. No murmurs, rubs, or gallops appreciated. Abdomen: Soft, non-tender, non-distended with normoactive bowel sounds. No hepatomegaly. No rebound/guarding. No obvious abdominal masses. Msk:  Strength and tone appear normal for age. Extremities: No clubbing or cyanosis. No edema.  Distal pedal pulses are 2+ and equal bilaterally. Neuro: Alert and oriented X 3. No focal deficit. No facial asymmetry. Moves all extremities spontaneously. Psych:  Responds to questions appropriately with a normal affect.    ASSESSMENT AND PLAN:  1. Chest pain in patient with multiple cardiac risk factors. Need to rule out MI. Symptoms may be related to Afib/flutter with RVR. Obtain serial cardiac enzymes/ Ecg. Check Echo. Start metoprolol 25 mg bid for rate/ischemia control. Anticoagulation with Lovenox versus heparin. Dr. Wynonia Lawman to follow up in am. Will need ischemic evaluation. ? myoview versus cath 2. Atrial fibrillation/flutter with RVR. Arrhythmia is chronic but uncontrolled due to medical noncompliance. Will start metoprolol. Probably not a candidate for antiarrhythmic therapy. Focus  on rate control. Need to consider long term anticoagulation with high [place] score. 3. IDDM poorly controlled. Per primary service. 4. Left renal cyst versus hydronephrosis. 5. Renal insufficiency- baseline is unknown. 6. Elevated d-dimer. CT negative for PE. 7. HTN - start metoprolol. Consider ACEi/ARB once creatinine stabilized.   Signed, Lavinia Mcneely [place] MD,FACC 08/21/2012, 5:33 PM

## 2012-08-21 NOTE — ED Provider Notes (Addendum)
History     CSN: [id number]  Arrival date & time 08/21/12  1139   First MD Initiated Contact with Patient 08/21/12 1220      Chief Complaint  Patient presents with  . Chest Pain    (Consider location/radiation/quality/duration/timing/severity/associated sxs/prior treatment) Patient is a 66 y.o. female presenting with chest pain. The history is provided by the patient.  Chest Pain    patient here complaining of 2 episodes of substernal chest pain lasting 10 minutes while exerting herself. Some associated dyspnea without diaphoresis. No recent fever or cough. No severe leg swelling or edema. Symptoms resolved spontaneously without treatment and no prior history of same. She does have a history of high blood pressure diabetes for which she has not been on medications do to financial reasons. She also has a history of chronic atrial fibrillation and she only takes aspirin for that  Past Medical History  Diagnosis Date  . Atrial fibrillation   . Hypertension   . Diabetes mellitus without complication   . Obesity   . High cholesterol     History reviewed. No pertinent past surgical history.  History reviewed. No pertinent family history.  History  Substance Use Topics  . Smoking status: Not on file  . Smokeless tobacco: Not on file  . Alcohol Use: No    OB History    Grav Para Term Preterm Abortions TAB SAB Ect Mult Living                  Review of Systems  Cardiovascular: Positive for chest pain.  All other systems reviewed and are negative.    Allergies  Review of patient's allergies indicates no known allergies.  Home Medications  No current outpatient prescriptions on file.  BP 173/94  Pulse 62  Temp 98.4 F (36.9 C) (Oral)  Resp 20  SpO2 99%  Physical Exam  Nursing note and vitals reviewed. Constitutional: She is oriented to person, place, and time. She appears well-developed and well-nourished.  Non-toxic appearance. No distress.  HENT:  Head:  Normocephalic and atraumatic.  Eyes: Conjunctivae normal, EOM and lids are normal. Pupils are equal, round, and reactive to light.  Neck: Normal range of motion. Neck supple. No tracheal deviation present. No mass present.  Cardiovascular: Regular rhythm and normal heart sounds.  Tachycardia present.  Exam reveals no gallop.   No murmur heard. Pulmonary/Chest: Effort normal. No stridor. No respiratory distress. She has decreased breath sounds. She has no wheezes. She has no rhonchi. She has no rales.  Abdominal: Soft. Normal appearance and bowel sounds are normal. She exhibits no distension. There is no tenderness. There is no rebound and no CVA tenderness.  Musculoskeletal: Normal range of motion. She exhibits no edema and no tenderness.  Neurological: She is alert and oriented to person, place, and time. She has normal strength. No cranial nerve deficit or sensory deficit. GCS eye subscore is 4. GCS verbal subscore is 5. GCS motor subscore is 6.  Skin: Skin is warm and dry. No abrasion and no rash noted.  Psychiatric: She has a normal mood and affect. Her speech is normal and behavior is normal.    ED Course  Procedures (including critical care time)   Labs Reviewed  CBC  BASIC METABOLIC PANEL  PRO B NATRIURETIC PEPTIDE  D-DIMER, QUANTITATIVE   Dg Chest 2 View  08/21/2012  *RADIOLOGY REPORT*  Clinical Data: Substernal chest pain and shortness of breath.  CHEST - 2 VIEW  Comparison: 03/13/2005.  Findings:  The cardiac silhouette, mediastinal and hilar contours are within normal limits and stable.  There are bronchitic lung changes with peribronchial thickening and increased interstitial markings.  No focal infiltrates, edema or effusions.  IMPRESSION: Bronchitic type lung changes suggesting bronchitis or interstitial pneumonitis.  No infiltrates or effusions.   Original Report Authenticated By: Marijo Sanes, M.D.      No diagnosis found.    MDM   3:57 PM No evidence of pe on chest  ct, pt with chest pain concerning for angina, will consult cards      Leota Jacobsen, MD 08/21/12 1314  Leota Jacobsen, MD 08/21/12 1601

## 2012-08-21 NOTE — ED Notes (Signed)
MD at bedside. 

## 2012-08-21 NOTE — Progress Notes (Signed)
ANTICOAGULATION CONSULT NOTE - Initial Consult  Pharmacy Consult for Lovenox  Indication: chest pain/ACS and atrial fibrillation  No Known Allergies  Patient Measurements: Height: 5\' 9"  (175.3 cm) Weight: 270 lb (122.471 kg) IBW/kg (Calculated) : 66.2    Vital Signs: Temp: 98.4 F (36.9 C) (12/08 1146) Temp src: Oral (12/08 1146) BP: 178/87 mmHg (12/08 1815) Pulse Rate: 93  (12/08 1800)  Labs:  Basename 08/21/12 1320  HGB 13.5  HCT 41.8  PLT 254  APTT --  LABPROT --  INR --  HEPARINUNFRC --  CREATININE 1.19*  CKTOTAL --  CKMB --  TROPONINI --    Estimated Creatinine Clearance: 66 ml/min (by C-G formula based on Cr of 1.19).   Medical History: Past Medical History  Diagnosis Date  . Atrial fibrillation     s/p DCCV 2005, 2006. Placed on Tikosyn 2006 (negative adenosine cardiolite 03/2005, could not measure EF).  . Hypertension   . Diabetes mellitus without complication   . Obesity   . High cholesterol     Assessment: 66yof with hx DM, A-Fib and medication non-compliance d/t financial reasons.  She presents to ED with CP, palpataions and found to be in A-Fib HR 90's and hypertensive BP 204/112.  First Tp not elevated.  Begin anticoagulation for CP, AFib.  Renal fx stable Scr 1.2 Goal of Therapy:  Anti-Xa level 0.6-1.2 units/ml 4hrs after LMWH dose given Monitor platelets by anticoagulation protocol: Yes   Plan:  Lovenox 1mg /kg = 120mg  q12 F/u renal fx and cbc  Nadine Counts 08/21/2012,6:40 PM

## 2012-08-22 ENCOUNTER — Observation Stay (HOSPITAL_COMMUNITY): Payer: Medicare Other

## 2012-08-22 DIAGNOSIS — I200 Unstable angina: Secondary | ICD-10-CM | POA: Diagnosis not present

## 2012-08-22 DIAGNOSIS — R079 Chest pain, unspecified: Secondary | ICD-10-CM | POA: Diagnosis not present

## 2012-08-22 DIAGNOSIS — R0789 Other chest pain: Secondary | ICD-10-CM | POA: Diagnosis not present

## 2012-08-22 LAB — GLUCOSE, CAPILLARY
Glucose-Capillary: 292 mg/dL — ABNORMAL HIGH (ref 70–99)
Glucose-Capillary: 264 mg/dL — ABNORMAL HIGH (ref 70–99)

## 2012-08-22 LAB — LIPID PANEL
Cholesterol: 248 mg/dL — ABNORMAL HIGH (ref 0–200)
Total CHOL/HDL Ratio: 5.2 RATIO
Triglycerides: 229 mg/dL — ABNORMAL HIGH (ref <150)
VLDL: 46 mg/dL — ABNORMAL HIGH (ref 0–40)

## 2012-08-22 LAB — BASIC METABOLIC PANEL
BUN: 26 mg/dL — ABNORMAL HIGH (ref 6–23)
GFR calc non Af Amer: 40 mL/min — ABNORMAL LOW (ref >90)
Glucose, Bld: 342 mg/dL — ABNORMAL HIGH (ref 70–99)
Potassium: 4.1 mEq/L (ref 3.5–5.1)

## 2012-08-22 LAB — CBC
HCT: 37 % (ref 36.0–46.0)
Hemoglobin: 11.9 g/dL — ABNORMAL LOW (ref 12.0–15.0)
MCH: 29.2 pg (ref 26.0–34.0)
MCHC: 32.2 g/dL (ref 30.0–36.0)

## 2012-08-22 LAB — CK TOTAL AND CKMB (NOT AT ARMC)
CK, MB: 5 ng/mL — ABNORMAL HIGH (ref 0.3–4.0)
CK, MB: 4 ng/mL (ref 0.3–4.0)
Relative Index: INVALID (ref 0.0–2.5)
Relative Index: INVALID (ref 0.0–2.5)
Relative Index: INVALID (ref 0.0–2.5)

## 2012-08-22 LAB — COMPREHENSIVE METABOLIC PANEL
ALT: 12 U/L (ref 0–35)
Albumin: 3.1 g/dL — ABNORMAL LOW (ref 3.5–5.2)
BUN: 23 mg/dL (ref 6–23)
Calcium: 9 mg/dL (ref 8.4–10.5)
GFR calc Af Amer: 53 mL/min — ABNORMAL LOW (ref >90)
Glucose, Bld: 332 mg/dL — ABNORMAL HIGH (ref 70–99)
Potassium: 4.4 mEq/L (ref 3.5–5.1)
Sodium: 135 mEq/L (ref 135–145)
Total Protein: 6.7 g/dL (ref 6.0–8.3)

## 2012-08-22 LAB — TROPONIN I
Troponin I: <0.3 ng/mL (ref <0.30)
Troponin I: 0.58 ng/mL (ref <0.30)

## 2012-08-22 MED ORDER — DIAZEPAM 5 MG PO TABS
10.0000 mg | ORAL_TABLET | ORAL | Status: AC
  Administered 2012-08-23: 10 mg via ORAL
  Filled 2012-08-22: qty 2

## 2012-08-22 MED ORDER — INSULIN GLARGINE 100 UNIT/ML ~~LOC~~ SOLN
20.0000 [IU] | Freq: Every day | SUBCUTANEOUS | Status: DC
  Administered 2012-08-22: 10 [IU] via SUBCUTANEOUS
  Administered 2012-08-23: 22:00:00 20 [IU] via SUBCUTANEOUS

## 2012-08-22 MED ORDER — INSULIN ASPART 100 UNIT/ML ~~LOC~~ SOLN
0.0000 [IU] | Freq: Three times a day (TID) | SUBCUTANEOUS | Status: DC
  Administered 2012-08-22: 8 [IU] via SUBCUTANEOUS
  Administered 2012-08-22: 5 [IU] via SUBCUTANEOUS
  Administered 2012-08-23: 8 [IU] via SUBCUTANEOUS

## 2012-08-22 MED ORDER — SODIUM CHLORIDE 0.9 % IV SOLN
1.0000 mL/kg/h | INTRAVENOUS | Status: DC
  Administered 2012-08-23: 1 mL/kg/h via INTRAVENOUS

## 2012-08-22 MED ORDER — ATORVASTATIN CALCIUM 40 MG PO TABS
40.0000 mg | ORAL_TABLET | Freq: Every day | ORAL | Status: DC
  Administered 2012-08-22 – 2012-08-23 (×2): 40 mg via ORAL
  Filled 2012-08-22 (×3): qty 1

## 2012-08-22 MED ORDER — TECHNETIUM TC 99M SESTAMIBI GENERIC - CARDIOLITE
30.0000 | Freq: Once | INTRAVENOUS | Status: AC | PRN
  Administered 2012-08-22: 30 via INTRAVENOUS

## 2012-08-22 MED ORDER — INSULIN ASPART 100 UNIT/ML ~~LOC~~ SOLN
4.0000 [IU] | Freq: Three times a day (TID) | SUBCUTANEOUS | Status: DC
  Administered 2012-08-22 – 2012-08-24 (×6): 4 [IU] via SUBCUTANEOUS

## 2012-08-22 MED ORDER — INSULIN ASPART 100 UNIT/ML ~~LOC~~ SOLN: 0.0000 [IU] | Freq: Every day | SUBCUTANEOUS | Status: DC

## 2012-08-22 MED ORDER — INSULIN GLARGINE 100 UNIT/ML ~~LOC~~ SOLN: 20.0000 [IU] | Freq: Every day | SUBCUTANEOUS | Status: DC

## 2012-08-22 NOTE — Progress Notes (Signed)
Clinical Education officer, museum received referral for [employer].  CSW reviewed chart and met with pt at bedside.  Pt declined any information stating, "I already have that". CSW to sign off, please re consult if needed.     Dala Dock, MSW, [place]

## 2012-08-22 NOTE — Progress Notes (Signed)
3rd troponin is abnormal.  With multiple risks and abnormal troponin and new chest pain feel need to proceed to cardiac cath.  Had resting part of nuclear today and will cancel the stress portion.  ECHO shows mild calcified and thickened AV with good LV function, RV and RA dilated.  Cardiac catheterization was discussed with the patient fully including risks of myocardial infarction, death, stroke, bleeding, arrhythmia, dye allergy, renal insufficiency or bleeding.  The patient understands and is willing to proceed. Discussed radial versus femoral approach as she asked and offered to get another MD to do radial if she wanted to but she said that she would like me to do cath from femoral.   W. Tollie Eth, Brooke Bonito. MD [facility]

## 2012-08-22 NOTE — Care Management Note (Signed)
    Page 1 of 1   08/22/2012     3:19:23 PM   CARE MANAGEMENT NOTE 08/22/2012  Patient:  Jenny Glover, Jenny Glover   Account Number:  [id number]  Date Initiated:  08/22/2012  Documentation initiated by:  Marvetta Gibbons  Subjective/Objective Assessment:   Pt admitted with chest pain     Action/Plan:   PTA pt lived at home with spouse   Anticipated DC Date:  08/22/2012   Anticipated DC Plan:  [place]  CM consult      Choice offered to / List presented to:             Status of service:  In process, will continue to follow Medicare Important Message given?   (If response is "NO", the following Medicare IM given date fields will be blank) Date Medicare IM given:   Date Additional Medicare IM given:    Discharge Disposition:    Per UR Regulation:  Reviewed for med. necessity/level of care/duration of stay  If discussed at [place] of Stay Meetings, dates discussed:    Comments:  08/22/12 1500- Marvetta Gibbons RN [phone] Spoke with pt and family (husband/daughter) at bedside- per conversation pt confirms that she recently signed up for Medicare but did not sign up for part D- explained that pt is still able to add part D as she still is in enrollment window- pt to f/u in adding part D for medications and understands that she will need to pay out of pocket for any prescriptions given at discharge. If pt is given prescriptions that are not on $4 list or there are ones that pt can not afford- Checked with pharmacy and pt would be eligible for the [facility] program for medication assistance if needed at time of discharge - this program would be a $3 copay for each prescription given. NCM to continue to follow for d/c needs- please contact if [facility] program needed.

## 2012-08-22 NOTE — Progress Notes (Signed)
Utilization review completed.  

## 2012-08-22 NOTE — Progress Notes (Signed)
Patient was admitted by [facility] as unassigned patient. She however  follows with Dr Reynold Bowen at [place] and has an appointment with him in 2 weeks. Marland Kitchen i have spoken with  Dr Forde Dandy and he will discuss with cardiology on who will be following this patient as the primary team.  [facility] hospitalist will sign off.

## 2012-08-22 NOTE — Progress Notes (Signed)
  Echocardiogram 2D Echocardiogram has been performed.  Nayib Remer FRANCES 08/22/2012, 11:24 AM

## 2012-08-22 NOTE — Progress Notes (Signed)
CRITICAL VALUE ALERT  Critical value received:  Trop 0.58  Date of notification:  08/22/2012  Time of notification:  [id number]  Critical value read back:yes  Nurse who received alert:  Dorna Bloom, RN  MD notified (1st page):  Dr. Wynonia Lawman  Time of first page:  [phone]

## 2012-08-22 NOTE — Progress Notes (Signed)
Subjective: Had a good night. Apparently chose to stop all Rx. Missed a recent appt with me. Feels much better. Less edema. No CP. HR is better, still in Afib. Feels hungry   Objective: Vital signs in last 24 hours: Temp:  [98.1 F (36.7 C)-99 F (37.2 C)] 98.1 F (36.7 C) (12/09 0600) Pulse Rate:  [62-107] 92  (12/09 0600) Resp:  [13-22] 20  (12/09 0600) BP: (148-204)/(67-112) 161/94 mmHg (12/09 0600) SpO2:  [95 %-99 %] 95 % (12/09 0600) Weight:  [122.471 kg (270 lb)-125.4 kg (276 lb 7.3 oz)] 125.4 kg (276 lb 7.3 oz) (12/08 1900)  Intake/Output from previous day: 12/08 0701 - 12/09 0700 In: 858.3 [I.V.:858.3] Out: -  Intake/Output this shift: Total I/O In: 240 [P.O.:240] Out: -   General: alert, sitting up in no distress. Face symmetric. Neck supple. Lungs clear. Ht sl irreg with sys murmur. abd soft NT, no masses. Distal pulses OK, trace edema. Awake, alert, clear speech  Lab Results   Basename 08/22/12 0200 08/21/12 1320  WBC 9.4 11.7*  RBC 4.08 4.51  HGB 11.9* 13.5  HCT 37.0 41.8  MCV 90.7 92.7  MCH 29.2 29.9  RDW 13.6 13.5  PLT 247 254    Basename 08/22/12 0200 08/21/12 1320  NA 132* 138  K 4.1 5.3*  CL 96 97  CO2 25 26  GLUCOSE 342* 439*  BUN 26* 28*  CREATININE 1.35* 1.19*  CALCIUM 9.0 9.8   Results for JACQUENETTE, GASPARI (MRN [id number]) as of 08/22/2012 08:37  Ref. Range 08/21/2012 19:51 08/22/2012 01:45  CK, MB Latest Range: 0.3-4.0 ng/mL 4.6 (H) 4.7 (H)  CK Total Latest Range: 7-177 U/L 63 66  Troponin I Latest Range: <0.30 ng/mL <0.30 <0.30   Studies/Results: Ct Abdomen Pelvis Wo Contrast  08/21/2012  *RADIOLOGY REPORT*  Clinical Data: 66 year old female with pain. Fluid attenuation structures in the upper left kidney - question cyst versus hydronephrosis.  CT ABDOMEN AND PELVIS WITHOUT CONTRAST  Technique:  Multidetector CT imaging of the abdomen and pelvis was performed following the standard protocol without intravenous contrast.  Comparison:  08/21/2012 chest CT  Findings: A 3 cm parapelvic cyst within the mid - upper left kidney causes mild dilatation of the left upper pole renal collecting system. No other renal abnormalities are identified.  The liver, spleen, pancreas, gallbladder and adrenal glands are unremarkable.  Please note that parenchymal abnormalities may be missed as intravenous contrast was not administered. No free fluid, enlarged lymph nodes, biliary dilation or abdominal aortic aneurysm identified.  The bowel bladder are within normal limits. The uterus and adnexal regions are unremarkable.  No acute or suspicious bony abnormalities are identified. Multilevel degenerative disc disease and facet arthropathy noted. Superior endplate Schmorl's nodes of L1 and L2 are noted.  IMPRESSION: 3 cm parapelvic cyst causing mild relative obstruction of the left upper pole collecting system - probably of little clinical significance.  Lumbar spine degenerative changes with Schmorl's nodes.   Original Report Authenticated By: Margarette Canada, M.D.    Dg Chest 2 View  08/21/2012  *RADIOLOGY REPORT*  Clinical Data: Substernal chest pain and shortness of breath.  CHEST - 2 VIEW  Comparison: 03/13/2005.  Findings: The cardiac silhouette, mediastinal and hilar contours are within normal limits and stable.  There are bronchitic lung changes with peribronchial thickening and increased interstitial markings.  No focal infiltrates, edema or effusions.  IMPRESSION: Bronchitic type lung changes suggesting bronchitis or interstitial pneumonitis.  No infiltrates or effusions.   Original Report  Authenticated By: Marijo Sanes, M.D.    Ct Angio Chest Pe W/cm &/or Wo Cm  08/21/2012  *RADIOLOGY REPORT*  Clinical Data: 66 year old female with chest pain and shortness of breath.  CT ANGIOGRAPHY CHEST  Technique:  Multidetector CT imaging of the chest using the standard protocol during bolus administration of intravenous contrast. Multiplanar reconstructed images  including MIPs were obtained and reviewed to evaluate the vascular anatomy.  Contrast: 147mL OMNIPAQUE IOHEXOL 350 MG/ML SOLN  Comparison: 08/21/2012 chest radiograph  Findings: This is a technically satisfactory study.  No pulmonary emboli are identified.  There is no evidence of thoracic aortic aneurysm.  Mild cardiomegaly and coronary artery calcifications are present.  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  There are no pleural or pericardial effusions. No enlarged lymph nodes are identified.  There is no evidence of airspace disease, consolidation, nodule, mass or endobronchial/endotracheal lesion.  Cysts versus hydronephrosis within the left upper pole noted. Consider further evaluation with CT or ultrasound.  No acute or suspicious bony abnormalities are identified.  IMPRESSION: No evidence of pulmonary emboli or thoracic aortic aneurysm.  Left upper renal cysts versus left hydronephrosis.  Recommend further evaluation with CT or ultrasound.  Coronary artery disease.   Original Report Authenticated By: Margarette Canada, M.D.     Scheduled Meds:   . sodium chloride   Intravenous STAT  . aspirin EC  325 mg Oral Daily  . enoxaparin (LOVENOX) injection  120 mg Subcutaneous Q12H  . influenza  inactive virus vaccine  0.5 mL Intramuscular Tomorrow-1000  . insulin aspart  0-15 Units Subcutaneous TID WC  . [COMPLETED] insulin aspart  8 Units Subcutaneous Once  . insulin glargine  10 Units Subcutaneous QHS  . metoprolol tartrate  25 mg Oral BID  . pneumococcal 23 valent vaccine  0.5 mL Intramuscular Tomorrow-1000  . sodium chloride  3 mL Intravenous Q12H  . sodium chloride  3 mL Intravenous Q12H  . [DISCONTINUED] aspirin  325 mg Oral Daily  . [DISCONTINUED] insulin regular  8 Units Subcutaneous Once   Continuous Infusions:  PRN Meds:sodium chloride, [COMPLETED] iohexol, sodium chloride  Assessment/Plan: Patient Active Problem List  Diagnosis  . DM (diabetes mellitus): BS's are getting  better on RX  . Chest pain: cards to decide on cardiolite vs cath. 1st 2 sets of enzymes negative  . HTN (hypertension): suboptimal but improving  . Paroxysmal atrial fibrillation: improved rate.   . 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

## 2012-08-22 NOTE — Progress Notes (Signed)
Subjective:  Well known to me.  Have not seen in a while.  Stopped all of her anticoagulants as well as meds except insulin 6 months ago. No clear lifesylechanges. NO chest pain today.  EKG no ischemia, enzymes negative.  Objective:  Vital Signs in the last 24 hours: BP 161/94  Pulse 92  Temp 98.1 F (36.7 C) (Oral)  Resp 20  Ht 5\' 9"  (1.753 m)  Wt 125.4 kg (276 lb 7.3 oz)  BMI 40.83 kg/m2  SpO2 95%  Physical Exam: Very large obese WF in NAD Lungs:  Clear to A&P Cardiac:  irregular rhythm, normal S1 and S2, no S3, 2/6 systolic murmur Abdomen:  Soft, nontender, no masses Extremities:  No edema present  Intake/Output from previous day: 12/08 0701 - 12/09 0700 In: 858.3 [I.V.:858.3] Out: -   Weight Filed Weights   08/21/12 1819 08/21/12 1900  Weight: 122.471 kg (270 lb) 125.4 kg (276 lb 7.3 oz)    Lab Results: Basic Metabolic Panel:  Basename 08/22/12 0200 08/21/12 1320  NA 132* 138  K 4.1 5.3*  CL 96 97  CO2 25 26  GLUCOSE 342* 439*  BUN 26* 28*  CREATININE 1.35* 1.19*   CBC:  Basename 08/22/12 0200 08/21/12 1320  WBC 9.4 11.7*  NEUTROABS -- --  HGB 11.9* 13.5  HCT 37.0 41.8  MCV 90.7 92.7  PLT 247 254   Cardiac Enzymes:  Basename 08/22/12 0145 08/21/12 1951  CKTOTAL 66 63  CKMB 4.7* 4.6*  CKMBINDEX -- --  TROPONINI <0.30 <0.30    Telemetry: Atrial fibrillation with somewhat rapid response  Assessment/Plan:  1. Chest pain possible unstable angina, MI r/o  2. Noncompliance 3. Hypertension 4. IDDM poor control 5. Morbid obesity 6. Acute renal failure may be due to contrast  Plan:  Check ECHO.  With contrast yesterday, no cath today.  I will plan on 2 day cardiolite because of size with rest portion today.Follow renal function carefully.      Kerry Hough  MD [facility]  08/22/2012, 10:20 AM

## 2012-08-23 ENCOUNTER — Encounter (HOSPITAL_COMMUNITY)
Admission: EM | Disposition: A | Discharge status: Home / Self Care | Payer: Self-pay | Source: Ambulatory Visit | Attending: Endocrinology

## 2012-08-23 ENCOUNTER — Encounter (HOSPITAL_COMMUNITY): Payer: Medicare Other

## 2012-08-23 DIAGNOSIS — R079 Chest pain, unspecified: Secondary | ICD-10-CM | POA: Diagnosis not present

## 2012-08-23 DIAGNOSIS — I214 Non-ST elevation (NSTEMI) myocardial infarction: Secondary | ICD-10-CM | POA: Diagnosis not present

## 2012-08-23 DIAGNOSIS — I2510 Atherosclerotic heart disease of native coronary artery without angina pectoris: Secondary | ICD-10-CM | POA: Diagnosis present

## 2012-08-23 DIAGNOSIS — Z794 Long term (current) use of insulin: Secondary | ICD-10-CM | POA: Diagnosis not present

## 2012-08-23 DIAGNOSIS — I119 Hypertensive heart disease without heart failure: Secondary | ICD-10-CM | POA: Diagnosis present

## 2012-08-23 DIAGNOSIS — I2119 ST elevation (STEMI) myocardial infarction involving other coronary artery of inferior wall: Secondary | ICD-10-CM | POA: Diagnosis not present

## 2012-08-23 DIAGNOSIS — Z91199 Patient's noncompliance with other medical treatment and regimen due to unspecified reason: Secondary | ICD-10-CM | POA: Diagnosis not present

## 2012-08-23 DIAGNOSIS — Z7901 Long term (current) use of anticoagulants: Secondary | ICD-10-CM | POA: Diagnosis not present

## 2012-08-23 DIAGNOSIS — Z23 Encounter for immunization: Secondary | ICD-10-CM | POA: Diagnosis not present

## 2012-08-23 DIAGNOSIS — R0789 Other chest pain: Secondary | ICD-10-CM | POA: Diagnosis not present

## 2012-08-23 DIAGNOSIS — Z8249 Family history of ischemic heart disease and other diseases of the circulatory system: Secondary | ICD-10-CM | POA: Diagnosis not present

## 2012-08-23 DIAGNOSIS — E1129 Type 2 diabetes mellitus with other diabetic kidney complication: Secondary | ICD-10-CM | POA: Diagnosis present

## 2012-08-23 DIAGNOSIS — Z6841 Body Mass Index (BMI) 40.0 and over, adult: Secondary | ICD-10-CM | POA: Diagnosis not present

## 2012-08-23 DIAGNOSIS — E7800 Pure hypercholesterolemia, unspecified: Secondary | ICD-10-CM | POA: Diagnosis present

## 2012-08-23 DIAGNOSIS — E785 Hyperlipidemia, unspecified: Secondary | ICD-10-CM | POA: Diagnosis present

## 2012-08-23 DIAGNOSIS — I798 Other disorders of arteries, arterioles and capillaries in diseases classified elsewhere: Secondary | ICD-10-CM | POA: Diagnosis present

## 2012-08-23 DIAGNOSIS — E1159 Type 2 diabetes mellitus with other circulatory complications: Secondary | ICD-10-CM | POA: Diagnosis present

## 2012-08-23 DIAGNOSIS — N058 Unspecified nephritic syndrome with other morphologic changes: Secondary | ICD-10-CM | POA: Diagnosis present

## 2012-08-23 DIAGNOSIS — N1330 Unspecified hydronephrosis: Secondary | ICD-10-CM | POA: Diagnosis not present

## 2012-08-23 DIAGNOSIS — Z7982 Long term (current) use of aspirin: Secondary | ICD-10-CM | POA: Diagnosis not present

## 2012-08-23 DIAGNOSIS — Z79899 Other long term (current) drug therapy: Secondary | ICD-10-CM | POA: Diagnosis not present

## 2012-08-23 DIAGNOSIS — I4891 Unspecified atrial fibrillation: Secondary | ICD-10-CM | POA: Diagnosis present

## 2012-08-23 DIAGNOSIS — N179 Acute kidney failure, unspecified: Secondary | ICD-10-CM | POA: Diagnosis not present

## 2012-08-23 DIAGNOSIS — I209 Angina pectoris, unspecified: Secondary | ICD-10-CM | POA: Diagnosis not present

## 2012-08-23 DIAGNOSIS — Z7902 Long term (current) use of antithrombotics/antiplatelets: Secondary | ICD-10-CM | POA: Diagnosis not present

## 2012-08-23 DIAGNOSIS — I200 Unstable angina: Secondary | ICD-10-CM | POA: Diagnosis not present

## 2012-08-23 HISTORY — PX: LEFT HEART CATHETERIZATION WITH CORONARY ANGIOGRAM: SHX5451

## 2012-08-23 HISTORY — PX: PERCUTANEOUS CORONARY STENT INTERVENTION (PCI-S): SHX5485

## 2012-08-23 LAB — CK TOTAL AND CKMB (NOT AT ARMC)
CK, MB: 2.7 ng/mL (ref 0.3–4.0)
Relative Index: INVALID (ref 0.0–2.5)
Relative Index: INVALID (ref 0.0–2.5)

## 2012-08-23 LAB — COMPREHENSIVE METABOLIC PANEL
CO2: 27 mEq/L (ref 19–32)
Calcium: 9.1 mg/dL (ref 8.4–10.5)
Creatinine, Ser: 1.12 mg/dL — ABNORMAL HIGH (ref 0.50–1.10)
GFR calc Af Amer: 58 mL/min — ABNORMAL LOW (ref >90)
GFR calc non Af Amer: 50 mL/min — ABNORMAL LOW (ref >90)
Glucose, Bld: 253 mg/dL — ABNORMAL HIGH (ref 70–99)

## 2012-08-23 LAB — CBC
Hemoglobin: 13 g/dL (ref 12.0–15.0)
MCH: 29.3 pg (ref 26.0–34.0)
MCV: 90.3 fL (ref 78.0–100.0)
RBC: 4.43 MIL/uL (ref 3.87–5.11)

## 2012-08-23 LAB — POCT ACTIVATED CLOTTING TIME: Activated Clotting Time: 354 seconds

## 2012-08-23 LAB — GLUCOSE, CAPILLARY
Glucose-Capillary: 200 mg/dL — ABNORMAL HIGH (ref 70–99)
Glucose-Capillary: 174 mg/dL — ABNORMAL HIGH (ref 70–99)
Glucose-Capillary: 152 mg/dL — ABNORMAL HIGH (ref 70–99)

## 2012-08-23 SURGERY — LEFT HEART CATHETERIZATION WITH CORONARY ANGIOGRAM
Anesthesia: LOCAL

## 2012-08-23 MED ORDER — SODIUM CHLORIDE 0.9 % IV SOLN
1.0000 mL/kg/h | INTRAVENOUS | Status: AC
  Administered 2012-08-23: 15:00:00 1 mL/kg/h via INTRAVENOUS

## 2012-08-23 MED ORDER — LIDOCAINE HCL (PF) 1 % IJ SOLN
INTRAMUSCULAR | Status: AC
  Filled 2012-08-23: qty 30

## 2012-08-23 MED ORDER — LISINOPRIL 20 MG PO TABS
20.0000 mg | ORAL_TABLET | Freq: Every day | ORAL | Status: DC
  Administered 2012-08-24: 20 mg via ORAL
  Filled 2012-08-23: qty 1

## 2012-08-23 MED ORDER — CLOPIDOGREL BISULFATE 300 MG PO TABS
ORAL_TABLET | ORAL | Status: AC
  Administered 2012-08-24: 09:00:00 75 mg via ORAL
  Filled 2012-08-23: qty 2

## 2012-08-23 MED ORDER — METOPROLOL TARTRATE 1 MG/ML IV SOLN
INTRAVENOUS | Status: AC
  Filled 2012-08-23: qty 5

## 2012-08-23 MED ORDER — HEPARIN (PORCINE) IN NACL 2-0.9 UNIT/ML-% IJ SOLN
INTRAMUSCULAR | Status: AC
  Filled 2012-08-23: qty 1500

## 2012-08-23 MED ORDER — BIVALIRUDIN 250 MG IV SOLR
INTRAVENOUS | Status: AC
  Filled 2012-08-23: qty 250

## 2012-08-23 MED ORDER — NITROGLYCERIN 0.2 MG/ML ON CALL CATH LAB
INTRAVENOUS | Status: AC
  Filled 2012-08-23: qty 1

## 2012-08-23 MED ORDER — ASPIRIN EC 81 MG PO TBEC
162.0000 mg | DELAYED_RELEASE_TABLET | Freq: Every day | ORAL | Status: DC
  Administered 2012-08-24: 09:00:00 162 mg via ORAL
  Filled 2012-08-23 (×2): qty 2

## 2012-08-23 MED ORDER — ACETAMINOPHEN 325 MG PO TABS: 650.0000 mg | ORAL_TABLET | ORAL | Status: DC | PRN

## 2012-08-23 MED ORDER — CLOPIDOGREL BISULFATE 75 MG PO TABS
75.0000 mg | ORAL_TABLET | Freq: Every day | ORAL | Status: DC
  Administered 2012-08-24: 75 mg via ORAL
  Filled 2012-08-23: qty 1

## 2012-08-23 MED ORDER — ONDANSETRON HCL 4 MG/2ML IJ SOLN: 4.0000 mg | Freq: Four times a day (QID) | INTRAMUSCULAR | Status: DC | PRN

## 2012-08-23 MED ORDER — INSULIN ASPART 100 UNIT/ML ~~LOC~~ SOLN
0.0000 [IU] | Freq: Three times a day (TID) | SUBCUTANEOUS | Status: DC
  Administered 2012-08-23 – 2012-08-24 (×3): 3 [IU] via SUBCUTANEOUS

## 2012-08-23 MED ORDER — METOPROLOL TARTRATE 50 MG PO TABS
50.0000 mg | ORAL_TABLET | Freq: Two times a day (BID) | ORAL | Status: DC
  Administered 2012-08-23 – 2012-08-24 (×2): 50 mg via ORAL
  Filled 2012-08-23 (×3): qty 1

## 2012-08-23 NOTE — CV Procedure (Signed)
PROCEDURE:  PCI PDA  INDICATIONS:  NSTEMI  The risks, benefits, and details of the procedure were explained to the patient.  The patient verbalized understanding and wanted to proceed.  Informed written consent was obtained.  PROCEDURE TECHNIQUE:  Dr. Wynonia Lawman performed the diagnostic catheterization revealing a 99% PDA lesion.  Right coronary angiography and the intervention was done using a Judkins R4 guide catheter.  Angiomax was used for anticoagulation.  An ACT was used to check that the Angiomax is therapeutic.  A JR 4 guiding catheter is used to engage the RCA.  A pro-water wire was placed across the lesion in the PDA.  A 2.0 x 8 emerge balloon was used to predilate the lesion to 8 atmospheres.  A 2.25 x 16 Promus premier stent was deployed at 14 atmospheres for 25 seconds.  A 2.5 x 8 [place] trek balloon was used to post dilate the midportion of the stent.  There is no residual stenosis.  There is an excellent angiographic result.  Several doses of A Mynx closure device was deployed successfully for hemostasis.   CONTRAST:  Total of 80 cc for the intervention portion.  COMPLICATIONS:  None.       IMPRESSIONS:  1. Successful drug-eluting stent to the PDA with a 2.25 x 16 Promus premier a drug-eluting stent.  This was post dilated to 2.6 mm in diameter in the midportion.  Given the patient's atrial fibrillation, the choice of stent was discussed with Dr. Wynonia Lawman prior to the intervention.  Given that she was diabetic and this was a small vessel, he felt that drug-eluting stent placement would be best, even in the setting of atrial fibrillation and need for prolonged anticoagulation.  RECOMMENDATION:  She will need dual antiplatelet therapy, typically for a year.  Given that she may need anticoagulation for atrial fibrillation, Dr. Wynonia Lawman will adjust the antiplatelet therapy accordingly.  Continue aggressive secondary prevention.  She will followup with Dr. Wynonia Lawman as an outpatient.

## 2012-08-23 NOTE — H&P (View-Only) (Signed)
3rd troponin is abnormal.  With multiple risks and abnormal troponin and new chest pain feel need to proceed to cardiac cath.  Had resting part of nuclear today and will cancel the stress portion.  ECHO shows mild calcified and thickened AV with good LV function, RV and RA dilated.  Cardiac catheterization was discussed with the patient fully including risks of myocardial infarction, death, stroke, bleeding, arrhythmia, dye allergy, renal insufficiency or bleeding.  The patient understands and is willing to proceed. Discussed radial versus femoral approach as she asked and offered to get another MD to do radial if she wanted to but she said that she would like me to do cath from femoral.   W. Tollie Eth, Brooke Bonito. MD [facility]

## 2012-08-23 NOTE — Progress Notes (Signed)
Subjective: Patient is awake cheerful this morning sitting on the edge of the bed with her family awaiting cardiac catheterization this morning. She's had no further chest pain. Third troponin was positive thus catheterization is pending.  Objective: Vital signs in last 24 hours: Temp:  [97.7 F (36.5 C)-99.2 F (37.3 C)] 99.2 F (37.3 C) (12/10 0500) Pulse Rate:  [74-88] 86  (12/10 0500) Resp:  [20] 20  (12/09 1400) BP: (147-163)/(82-84) 163/82 mmHg (12/10 0500) SpO2:  [96 %-98 %] 97 % (12/10 0500) Weight change:   CBG (last 3)   Basename 08/22/12 2032 08/22/12 1635 08/22/12 1320  GLUCAP 172* 250* 264*    Intake/Output from previous day: 12/09 0701 - 12/10 0700 In: 758.5 [P.O.:720; I.V.:38.5] Out: -   Physical Exam:  General: alert, sitting up in no distress. Face symmetric. Neck supple. Lungs clear. Ht sl irreg with sys murmur. abd soft NT, no masses. Distal pulses OK, trace edema. Awake, alert, clear speech   Lab Results:  Basename 08/23/12 0105 08/22/12 0900  NA 136 135  K 4.4 4.4  CL 100 98  CO2 27 24  GLUCOSE 253* 332*  BUN 21 23  CREATININE 1.12* 1.21*  CALCIUM 9.1 9.0  MG -- --  PHOS -- --    Basename 08/23/12 0105 08/22/12 0900  AST 20 21  ALT 11 12  ALKPHOS 81 84  BILITOT 0.4 0.4  PROT 7.0 6.7  ALBUMIN 3.2* 3.1*    Basename 08/23/12 0105 08/22/12 0200  WBC 10.1 9.4  NEUTROABS -- --  HGB 13.0 11.9*  HCT 40.0 37.0  MCV 90.3 90.7  PLT 236 247   Lab Results  Component Value Date   INR 0.98 08/23/2012   INR 2.5* 02/28/2008    Basename 08/23/12 0105 08/22/12 2011 08/22/12 1359 08/22/12 0900 08/22/12 0145 08/21/12 1951  CKTOTAL 57 64 68 -- -- --  CKMB 3.4 4.0 5.0* -- -- --  CKMBINDEX -- -- -- -- -- --  TROPONINI -- -- -- 0.58* <0.30 <0.30   No results found for this basename: TSH,T4TOTAL,FREET3,T3FREE,THYROIDAB in the last 72 hours No results found for this basename: VITAMINB12:2,FOLATE:2,FERRITIN:2,TIBC:2,IRON:2,RETICCTPCT:2 in the last  72 hours  Studies/Results: Ct Abdomen Pelvis Wo Contrast  08/21/2012  *RADIOLOGY REPORT*  Clinical Data: 66 year old female with pain. Fluid attenuation structures in the upper left kidney - question cyst versus hydronephrosis.  CT ABDOMEN AND PELVIS WITHOUT CONTRAST  Technique:  Multidetector CT imaging of the abdomen and pelvis was performed following the standard protocol without intravenous contrast.  Comparison: 08/21/2012 chest CT  Findings: Glover 3 cm parapelvic cyst within the mid - upper left kidney causes mild dilatation of the left upper pole renal collecting system. No other renal abnormalities are identified.  The liver, spleen, pancreas, gallbladder and adrenal glands are unremarkable.  Please note that parenchymal abnormalities may be missed as intravenous contrast was not administered. No free fluid, enlarged lymph nodes, biliary dilation or abdominal aortic aneurysm identified.  The bowel bladder are within normal limits. The uterus and adnexal regions are unremarkable.  No acute or suspicious bony abnormalities are identified. Multilevel degenerative disc disease and facet arthropathy noted. Superior endplate Schmorl's nodes of L1 and L2 are noted.  IMPRESSION: 3 cm parapelvic cyst causing mild relative obstruction of the left upper pole collecting system - probably of little clinical significance.  Lumbar spine degenerative changes with Schmorl's nodes.   Original Report Authenticated By: Margarette Canada, M.D.    Dg Chest 2 View  08/21/2012  *RADIOLOGY  REPORT*  Clinical Data: Substernal chest pain and shortness of breath.  CHEST - 2 VIEW  Comparison: 03/13/2005.  Findings: The cardiac silhouette, mediastinal and hilar contours are within normal limits and stable.  There are bronchitic lung changes with peribronchial thickening and increased interstitial markings.  No focal infiltrates, edema or effusions.  IMPRESSION: Bronchitic type lung changes suggesting bronchitis or interstitial pneumonitis.   No infiltrates or effusions.   Original Report Authenticated By: Marijo Sanes, M.D.    Ct Angio Chest Pe W/cm &/or Wo Cm  08/21/2012  *RADIOLOGY REPORT*  Clinical Data: 66 year old female with chest pain and shortness of breath.  CT ANGIOGRAPHY CHEST  Technique:  Multidetector CT imaging of the chest using the standard protocol during bolus administration of intravenous contrast. Multiplanar reconstructed images including MIPs were obtained and reviewed to evaluate the vascular anatomy.  Contrast: 124mL OMNIPAQUE IOHEXOL 350 MG/ML SOLN  Comparison: 08/21/2012 chest radiograph  Findings: This is [place] technically satisfactory study.  No pulmonary emboli are identified.  There is no evidence of thoracic aortic aneurysm.  Mild cardiomegaly and coronary artery calcifications are present.  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  There are no pleural or pericardial effusions. No enlarged lymph nodes are identified.  There is no evidence of airspace disease, consolidation, nodule, mass or endobronchial/endotracheal lesion.  Cysts versus hydronephrosis within the left upper pole noted. Consider further evaluation with CT or ultrasound.  No acute or suspicious bony abnormalities are identified.  IMPRESSION: No evidence of pulmonary emboli or thoracic aortic aneurysm.  Left upper renal cysts versus left hydronephrosis.  Recommend further evaluation with CT or ultrasound.  Coronary artery disease.   Original Report Authenticated By: Margarette Canada, M.D.    Nm Myocar Single W/spect W/wall Motion And Ef  08/22/2012  *RADIOLOGY REPORT*  Clinical Data:  Chest pain.  Originally planned for 2-day exam, but stress portion was subsequently cancelled by Dr. Wynonia Lawman.  MYOCARDIAL IMAGING WITH SPECT (REST)  Technique:  Standard myocardial SPECT imaging was performed after resting intravenous injection of 30 mCi Tc-22m sestamibi.  Comparison:  None.  Findings: Resting myocardial SPECT images show thinning of the anteroapical wall  likely due to breast attenuation artifact.  No other myocardial perfusion defects are seen at rest.  IMPRESSION: No definite resting myocardial perfusion defects.   Original Report Authenticated By: Earle Gell, M.D.      Assessment/Plan: #1 chest pain with positive enzymes cardiac catheterization today  #2 paroxysmal atrial fibrillation rate stable  #3 diabetes mellitus type 2 blood sugars 100s and 200s somewhat labile well off on taking any further until her catheterization diet is been restored  #4 essential hypertension stable  #5 hyperlipidemia  Plan disposition and plan are per cardiology as patient has no other medical needs necessitating hospitalization   LOS: 2 days   Jenny Glover 08/23/2012, 7:14 AM

## 2012-08-23 NOTE — CV Procedure (Signed)
Cardiac Catheterization Report   Jenny Glover    66 y.o.  female  DOB: [birth year]  MRN: [id number]  08/23/2012   PROCEDURE:  Left heart catheterization with selective coronary angiography, left ventriculogram.  INDICATIONS:  Non-STEMI with positive troponins. The risks, benefits, and details of the procedure were explained to the patient.  The patient verbalized understanding and wanted to proceed.  Informed written consent was obtained.  PROCEDURE TECHNIQUE:  After Xylocaine anesthesia a 71F sheath was placed in the right femoral artery with a single anterior needle wall stick.   Left coronary angiography was done using a Judkins L4 guide catheter.  Right coronary angiography was done using a Judkins R4 guide catheter.  A 30 cc ventriculogram was performed in the 30 degree RAO projection.  Tolerated the procedure well.  Images were reviewed on the table in the interventional cardiologist was consulted for possible stenting.   CONTRAST:  Total of 60 cc.  COMPLICATIONS:  None.    HEMODYNAMICS:  Aortic postcontrast 163/89, LV postcontrast 163/8-19. There was no gradient between the left ventricle and aorta.    ANGIOGRAPHIC DATA:    CORONARY ARTERIES:   Arise and distribute normally. Right dominant. Mild coronary calcification is noted.  Left main coronary artery: Normal.  Left anterior descending: Scattered irregularities are noted. The distal vessel is somewhat small in size but no significant obstructive stenoses are noted.  Circumflex coronary artery: 2 marginal arteries are noted and a smaller continuation branch are noted. Scattered irregularities seen but no significant stenoses noted.  Right coronary artery: Right coronary artery with scattered irregularities proximally. The posterior descending in its proximal portion has a severe 95-99% focal stenosis. The vessel appears to be of moderate size and does extend to the apex. The posterior  lateral branch has scattered irregularities.  LEFT VENTRICULOGRAM:  Performed in the 30 RAO projection.  The aortic valve is mildly calcified. Mitral annular calcification is seen. No mitral regurgitation is seen.  The left ventricle is normal in size with normal wall motion. Estimated ejection fraction is 60%.  IMPRESSIONS:  1. Coronary artery disease with severe focal stenosis of the proximal portion of the posterior descending artery and scattered irregularities the left coronary artery system. 2. Normal left ventricular function. 3. Calcified aortic valve with no gradient and mitral annular calcification.  RECOMMENDATION:  Consideration of intervention of the posterior descending artery light of a non-ST elevation myocardial infarction with positive troponins and unstable angina.   Kerry Hough MD [facility]

## 2012-08-23 NOTE — Progress Notes (Signed)
Stable after cath.  Cath site clean and dry after closure.  She needs intensive risk factor modification. Discussed importance of compliance and not stopping any meds.  Will keep on Aspirin and plavix and add novel anticoagulation with either Pradaxa, or Xarelto for a month and then stop aspirin.  She will need to be on beta blocker and add ace in am.  Hopefully d/c in am.  W. Doristine Church MD [facility]

## 2012-08-23 NOTE — Interval H&P Note (Signed)
History and Physical Interval Note:  08/23/2012 7:43 AM  Jenny Glover  has presented today for catheterization with the diagnosis of unstable angina. The various methods of treatment have been discussed with the patient and family. After consideration of risks, benefits and other options for treatment, the patient has consented to  Procedure(s) (LRB) with comments: LEFT HEART CATHETERIZATION WITH CORONARY ANGIOGRAM (N/A) as a surgical intervention .  The patient's history has been reviewed, patient examined, no change in status, stable for surgery.  I have reviewed the patient's chart and labs.  Questions were answered to the patient's satisfaction.     Kerry Hough MD [facility]

## 2012-08-23 NOTE — Progress Notes (Signed)
Utilization Review Completed.   Dorrian Doggett Ullmer, RN, BSN Nurse Case Manager  [phone]  

## 2012-08-24 DIAGNOSIS — I200 Unstable angina: Secondary | ICD-10-CM | POA: Diagnosis not present

## 2012-08-24 DIAGNOSIS — N1330 Unspecified hydronephrosis: Secondary | ICD-10-CM | POA: Diagnosis not present

## 2012-08-24 DIAGNOSIS — Z23 Encounter for immunization: Secondary | ICD-10-CM | POA: Diagnosis not present

## 2012-08-24 DIAGNOSIS — R0789 Other chest pain: Secondary | ICD-10-CM | POA: Diagnosis not present

## 2012-08-24 DIAGNOSIS — N179 Acute kidney failure, unspecified: Secondary | ICD-10-CM | POA: Diagnosis not present

## 2012-08-24 DIAGNOSIS — I214 Non-ST elevation (NSTEMI) myocardial infarction: Secondary | ICD-10-CM | POA: Diagnosis not present

## 2012-08-24 DIAGNOSIS — I209 Angina pectoris, unspecified: Secondary | ICD-10-CM | POA: Diagnosis not present

## 2012-08-24 LAB — BASIC METABOLIC PANEL
CO2: 26 mEq/L (ref 19–32)
Chloride: 101 mEq/L (ref 96–112)
GFR calc Af Amer: 58 mL/min — ABNORMAL LOW (ref >90)
Potassium: 4 mEq/L (ref 3.5–5.1)

## 2012-08-24 LAB — GLUCOSE, CAPILLARY: Glucose-Capillary: 188 mg/dL — ABNORMAL HIGH (ref 70–99)

## 2012-08-24 MED ORDER — RIVAROXABAN 20 MG PO TABS: 20.0000 mg | ORAL_TABLET | Freq: Every day | ORAL | Status: AC

## 2012-08-24 MED ORDER — INSULIN ASPART 100 UNIT/ML ~~LOC~~ SOLN: 0.0000 [IU] | Freq: Three times a day (TID) | SUBCUTANEOUS | Status: DC

## 2012-08-24 MED ORDER — INFLUENZA VIRUS VACC SPLIT PF IM SUSP: 0.5000 mL | INTRAMUSCULAR | Status: DC

## 2012-08-24 MED ORDER — ATORVASTATIN CALCIUM 40 MG PO TABS: 40.0000 mg | ORAL_TABLET | Freq: Every day | ORAL | Status: DC

## 2012-08-24 MED ORDER — INSULIN GLARGINE 100 UNIT/ML ~~LOC~~ SOLN: 20.0000 [IU] | Freq: Every day | SUBCUTANEOUS | Status: DC

## 2012-08-24 MED ORDER — INFLUENZA VIRUS VACC SPLIT PF IM SUSP
0.5000 mL | Freq: Once | INTRAMUSCULAR | Status: AC
  Administered 2012-08-24: 10:00:00 0.5 mL via INTRAMUSCULAR
  Filled 2012-08-24: qty 0.5

## 2012-08-24 MED ORDER — LISINOPRIL 20 MG PO TABS: 20.0000 mg | ORAL_TABLET | Freq: Every day | ORAL | Status: DC

## 2012-08-24 MED ORDER — METOPROLOL TARTRATE 50 MG PO TABS: 50.0000 mg | ORAL_TABLET | Freq: Two times a day (BID) | ORAL | Status: DC

## 2012-08-24 MED ORDER — CLOPIDOGREL BISULFATE 75 MG PO TABS: 75.0000 mg | ORAL_TABLET | Freq: Every day | ORAL | Status: DC

## 2012-08-24 MED ORDER — RIVAROXABAN 20 MG PO TABS
20.0000 mg | ORAL_TABLET | Freq: Every day | ORAL | Status: DC
  Filled 2012-08-24: qty 1

## 2012-08-24 MED FILL — Dextrose Inj 5%: INTRAVENOUS | Qty: 50 | Status: AC

## 2012-08-24 NOTE — Progress Notes (Signed)
Subjective:  No chest pain.  Cath site fine.  Objective:  Vital Signs in the last 24 hours: BP 155/78  Pulse 79  Temp 98.1 F (36.7 C) (Oral)  Resp 12  Ht 5\' 9"  (1.753 m)  Wt 125.4 kg (276 lb 7.3 oz)  BMI 40.83 kg/m2  SpO2 98%  Physical Exam: Obese WF in NAD Extremities:  Cath site clean, dry, no ecchymosis  Intake/Output from previous day: 12/10 0701 - 12/11 0700 In: 1254.4 [P.O.:920; I.V.:334.4] Out: 2500 [Urine:2500] Weight Filed Weights   08/21/12 1819 08/21/12 1900  Weight: 122.471 kg (270 lb) 125.4 kg (276 lb 7.3 oz)    Lab Results: Basic Metabolic Panel:  Basename 08/24/12 0540 08/23/12 0105  NA 137 136  K 4.0 4.4  CL 101 100  CO2 26 27  GLUCOSE 201* 253*  BUN 20 21  CREATININE 1.12* 1.12*    CBC:  Basename 08/23/12 0105 08/22/12 0200  WBC 10.1 9.4  NEUTROABS -- --  HGB 13.0 11.9*  HCT 40.0 37.0  MCV 90.3 90.7  PLT 236 247    BNP    Component Value Date/Time   PROBNP 573.3* 08/21/2012 1320   Assessment/Plan:  Stable post PCI.  OK for discharge.  Should f/u with me in 1 week.  Discussed importance of compliance with meds and lifestyle recommendations.     Kerry Hough  MD [facility]  08/24/2012, 8:50 AM

## 2012-08-24 NOTE — Progress Notes (Addendum)
CARDIAC REHAB PHASE I   PRE:  Rate/Rhythm: 81afib  BP:  Supine: 158/70  Sitting:   Standing:    SaO2:   MODE:  Ambulation: 650 ft   POST:  Rate/Rhythem: 93 afib  BP:  Supine:   Sitting: 169/67  Standing:    SaO2:  0810-0920 Pt walked 650 ft on RA with steady gait. Tolerated well. Denied CP. To recliner with call bell. Education completed. Diabetic and heart healthy diets given. Permission given to refer to CRP 2 [place].  Jenny Glover

## 2012-08-24 NOTE — Discharge Summary (Signed)
DISCHARGE SUMMARY  Jenny Glover  MR#: [id number]  DOB:October 19, 1945  Date of Admission: 08/21/2012 Date of Discharge: 08/24/2012  Attending 104 Antony Haste  Patient's PCP: [place] Team:  Jacolyn Reedy, MD   Discharge Diagnoses: Principal Problem:  *NSTEMI (non-ST elevated myocardial infarction), now s/p stent Active Problems:  DM (diabetes mellitus) with peripheral vascular complication, improved  Hypertensive heart disease  Atrial fibrillation, stable rate  Hyperlipidemia, back on Rx  CAD (coronary artery disease)  Morbid obesity  Unstable angina Renal cyst with mild hydronephrosis   Discharge Medications:   Medication List     As of 08/24/2012  8:32 AM    STOP taking these medications         HUMULIN R 500 UNIT/ML Soln injection   Generic drug: insulin regular human CONCENTRATED      TAKE these medications         aspirin 81 MG chewable tablet   Chew 162 mg by mouth daily.      atorvastatin 40 MG tablet   Commonly known as: LIPITOR   Take 1 tablet (40 mg total) by mouth daily at 6 PM.      clopidogrel 75 MG tablet   Commonly known as: PLAVIX   Take 1 tablet (75 mg total) by mouth daily with breakfast.      insulin aspart 100 UNIT/ML injection   Commonly known as: novoLOG   Inject 0-15 Units into the skin 3 (three) times daily with meals.      insulin glargine 100 UNIT/ML injection   Commonly known as: LANTUS   Inject 20 Units into the skin at bedtime.      lisinopril 20 MG tablet   Commonly known as: PRINIVIL,ZESTRIL   Take 1 tablet (20 mg total) by mouth daily.      metoprolol 50 MG tablet   Commonly known as: LOPRESSOR   Take 1 tablet (50 mg total) by mouth 2 (two) times daily.      Rivaroxaban 20 MG Tabs   Commonly known as: XARELTO   Take 1 tablet (20 mg total) by mouth daily.        Hospital Procedures: Ct Abdomen Pelvis Wo Contrast  08/21/2012  *RADIOLOGY REPORT*  Clinical Data:  66 year old female with pain. Fluid attenuation structures in the upper left kidney - question cyst versus hydronephrosis.  CT ABDOMEN AND PELVIS WITHOUT CONTRAST  Technique:  Multidetector CT imaging of the abdomen and pelvis was performed following the standard protocol without intravenous contrast.  Comparison: 08/21/2012 chest CT  Findings: A 3 cm parapelvic cyst within the mid - upper left kidney causes mild dilatation of the left upper pole renal collecting system. No other renal abnormalities are identified.  The liver, spleen, pancreas, gallbladder and adrenal glands are unremarkable.  Please note that parenchymal abnormalities may be missed as intravenous contrast was not administered. No free fluid, enlarged lymph nodes, biliary dilation or abdominal aortic aneurysm identified.  The bowel bladder are within normal limits. The uterus and adnexal regions are unremarkable.  No acute or suspicious bony abnormalities are identified. Multilevel degenerative disc disease and facet arthropathy noted. Superior endplate Schmorl's nodes of L1 and L2 are noted.  IMPRESSION: 3 cm parapelvic cyst causing mild relative obstruction of the left upper pole collecting system - probably of little clinical significance.  Lumbar spine degenerative changes with Schmorl's nodes.   Original Report Authenticated By: Margarette Canada, M.D.    Dg Chest 2 View  08/21/2012  *RADIOLOGY  REPORT*  Clinical Data: Substernal chest pain and shortness of breath.  CHEST - 2 VIEW  Comparison: 03/13/2005.  Findings: The cardiac silhouette, mediastinal and hilar contours are within normal limits and stable.  There are bronchitic lung changes with peribronchial thickening and increased interstitial markings.  No focal infiltrates, edema or effusions.  IMPRESSION: Bronchitic type lung changes suggesting bronchitis or interstitial pneumonitis.  No infiltrates or effusions.   Original Report Authenticated By: Marijo Sanes, M.D.    Ct Angio Chest Pe  W/cm &/or Wo Cm  08/21/2012  *RADIOLOGY REPORT*  Clinical Data: 66 year old female with chest pain and shortness of breath.  CT ANGIOGRAPHY CHEST  Technique:  Multidetector CT imaging of the chest using the standard protocol during bolus administration of intravenous contrast. Multiplanar reconstructed images including MIPs were obtained and reviewed to evaluate the vascular anatomy.  Contrast: 152mL OMNIPAQUE IOHEXOL 350 MG/ML SOLN  Comparison: 08/21/2012 chest radiograph  Findings: This is a technically satisfactory study.  No pulmonary emboli are identified.  There is no evidence of thoracic aortic aneurysm.  Mild cardiomegaly and coronary artery calcifications are present.  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  There are no pleural or pericardial effusions. No enlarged lymph nodes are identified.  There is no evidence of airspace disease, consolidation, nodule, mass or endobronchial/endotracheal lesion.  Cysts versus hydronephrosis within the left upper pole noted. Consider further evaluation with CT or ultrasound.  No acute or suspicious bony abnormalities are identified.  IMPRESSION: No evidence of pulmonary emboli or thoracic aortic aneurysm.  Left upper renal cysts versus left hydronephrosis.  Recommend further evaluation with CT or ultrasound.  Coronary artery disease.   Original Report Authenticated By: Margarette Canada, M.D.    Nm Myocar Single W/spect W/wall Motion And Ef  08/22/2012  *RADIOLOGY REPORT*  Clinical Data:  Chest pain.  Originally planned for 2-day exam, but stress portion was subsequently cancelled by Dr. Wynonia Lawman.  MYOCARDIAL IMAGING WITH SPECT (REST)  Technique:  Standard myocardial SPECT imaging was performed after resting intravenous injection of 30 mCi Tc-25m sestamibi.  Comparison:  None.  Findings: Resting myocardial SPECT images show thinning of the anteroapical wall likely due to breast attenuation artifact.  No other myocardial perfusion defects are seen at rest.   IMPRESSION: No definite resting myocardial perfusion defects.   Original Report Authenticated By: Earle Gell, M.D.     History of Present Illness: Chest pain  Hospital Course: 66 yo WF with hx DM, Afib. Presented with CP and afib with RVR. She had stopped taking all of her meds for several months. BS was 400+. Eval included a CTA that was negative for PE. Subsequent eval showed positive cardiac enzymes. She was taken to the cath lab with a stent placed. She has done well. BS"s are trending down. She is now back on statins. Her renal function is stable this AM. She has had no more cardiac pain. She has not had bleeding issues from the groin. Plan is for ASA/Plavix/Xarelto for a month then continued plavix thereafter. We will try using lantus and novolog initially for sugars but may need to resume U500. BS's here are about 200.   Day of Discharge Exam BP 155/78  Pulse 79  Temp 98.1 F (36.7 C) (Oral)  Resp 12  Ht 5\' 9"  (1.753 m)  Wt 125.4 kg (276 lb 7.3 oz)  BMI 40.83 kg/m2  SpO2 98%  Physical Exam: General appearance: alert, sitting up in no distress. Face symmetric, oral membranes moist Eyes: no scleral  icterus  Resp: clear to auscultation bilaterally, no wheeze Cardio: irregularly irregular rhythm GI: soft, non-tender; bowel sounds normal; no masses,  no organomegaly Extremities: no clubbing, cyanosis or edema, distal pulses intact No overt bleeding at cath site Neuro: alert, clear speech, mentating well. No tremor  Discharge Labs:  [facility] 08/24/12 0540 08/23/12 0105  NA 137 136  K 4.0 4.4  CL 101 100  CO2 26 27  GLUCOSE 201* 253*  BUN 20 21  CREATININE 1.12* 1.12*  CALCIUM 9.2 9.1  MG -- --  PHOS -- --    Basename 08/23/12 0105 08/22/12 0900  AST 20 21  ALT 11 12  ALKPHOS 81 84  BILITOT 0.4 0.4  PROT 7.0 6.7  ALBUMIN 3.2* 3.1*    Basename 08/23/12 0105 08/22/12 0200  WBC 10.1 9.4  NEUTROABS -- --  HGB 13.0 11.9*  HCT 40.0 37.0  MCV 90.3 90.7  PLT 236  247   Lab Results  Component Value Date   INR 0.98 08/23/2012   INR 2.5* 02/28/2008    Basename 08/23/12 1025 08/23/12 0105 08/22/12 2011 08/22/12 0900 08/22/12 0145 08/21/12 1951  CKTOTAL 42 57 64 -- -- --  CKMB 2.7 3.4 4.0 -- -- --  CKMBINDEX -- -- -- -- -- --  TROPONINI -- -- -- 0.58* <0.30 <0.30      Discharge instructions:   Disposition: home Follow-up Appts: Follow-up with Dr. Forde Dandy at [facility] in 2 weeks  Call for appointment.  Condition on Discharge: improved  Tests Needing Follow-up: none  Signed: Danique Hartsough ALAN 08/24/2012, 8:32 AM

## 2012-10-03 DIAGNOSIS — E1149 Type 2 diabetes mellitus with other diabetic neurological complication: Secondary | ICD-10-CM | POA: Diagnosis not present

## 2012-10-03 DIAGNOSIS — I4891 Unspecified atrial fibrillation: Secondary | ICD-10-CM | POA: Diagnosis not present

## 2012-10-03 DIAGNOSIS — E785 Hyperlipidemia, unspecified: Secondary | ICD-10-CM | POA: Diagnosis not present

## 2012-10-03 DIAGNOSIS — I2510 Atherosclerotic heart disease of native coronary artery without angina pectoris: Secondary | ICD-10-CM | POA: Diagnosis not present

## 2012-11-17 DIAGNOSIS — G589 Mononeuropathy, unspecified: Secondary | ICD-10-CM | POA: Diagnosis not present

## 2012-11-17 DIAGNOSIS — I2510 Atherosclerotic heart disease of native coronary artery without angina pectoris: Secondary | ICD-10-CM | POA: Diagnosis not present

## 2012-12-20 DIAGNOSIS — E119 Type 2 diabetes mellitus without complications: Secondary | ICD-10-CM | POA: Diagnosis not present

## 2012-12-20 DIAGNOSIS — I10 Essential (primary) hypertension: Secondary | ICD-10-CM | POA: Diagnosis not present

## 2012-12-20 DIAGNOSIS — Z79899 Other long term (current) drug therapy: Secondary | ICD-10-CM | POA: Diagnosis not present

## 2012-12-22 DIAGNOSIS — B351 Tinea unguium: Secondary | ICD-10-CM | POA: Diagnosis not present

## 2013-01-02 DIAGNOSIS — I4891 Unspecified atrial fibrillation: Secondary | ICD-10-CM | POA: Diagnosis not present

## 2013-01-02 DIAGNOSIS — E1149 Type 2 diabetes mellitus with other diabetic neurological complication: Secondary | ICD-10-CM | POA: Diagnosis not present

## 2013-01-02 DIAGNOSIS — I2510 Atherosclerotic heart disease of native coronary artery without angina pectoris: Secondary | ICD-10-CM | POA: Diagnosis not present

## 2013-01-02 DIAGNOSIS — D126 Benign neoplasm of colon, unspecified: Secondary | ICD-10-CM | POA: Diagnosis not present

## 2013-01-02 DIAGNOSIS — I10 Essential (primary) hypertension: Secondary | ICD-10-CM | POA: Diagnosis not present

## 2013-01-19 DIAGNOSIS — E785 Hyperlipidemia, unspecified: Secondary | ICD-10-CM | POA: Diagnosis not present

## 2013-01-19 DIAGNOSIS — E109 Type 1 diabetes mellitus without complications: Secondary | ICD-10-CM | POA: Diagnosis not present

## 2013-01-19 DIAGNOSIS — Z9119 Patient's noncompliance with other medical treatment and regimen: Secondary | ICD-10-CM | POA: Diagnosis not present

## 2013-01-19 DIAGNOSIS — I4891 Unspecified atrial fibrillation: Secondary | ICD-10-CM | POA: Diagnosis not present

## 2013-01-19 DIAGNOSIS — I2510 Atherosclerotic heart disease of native coronary artery without angina pectoris: Secondary | ICD-10-CM | POA: Diagnosis not present

## 2013-01-19 DIAGNOSIS — Z7901 Long term (current) use of anticoagulants: Secondary | ICD-10-CM | POA: Diagnosis not present

## 2013-01-19 DIAGNOSIS — I10 Essential (primary) hypertension: Secondary | ICD-10-CM | POA: Diagnosis not present

## 2013-02-02 DIAGNOSIS — I10 Essential (primary) hypertension: Secondary | ICD-10-CM | POA: Diagnosis not present

## 2013-03-23 DIAGNOSIS — I10 Essential (primary) hypertension: Secondary | ICD-10-CM | POA: Diagnosis not present

## 2013-03-23 DIAGNOSIS — E1149 Type 2 diabetes mellitus with other diabetic neurological complication: Secondary | ICD-10-CM | POA: Diagnosis not present

## 2013-03-23 DIAGNOSIS — G589 Mononeuropathy, unspecified: Secondary | ICD-10-CM | POA: Diagnosis not present

## 2013-05-04 ENCOUNTER — Encounter: Payer: Self-pay | Admitting: Gastroenterology

## 2013-05-09 ENCOUNTER — Other Ambulatory Visit: Payer: Self-pay | Admitting: Endocrinology

## 2013-05-09 DIAGNOSIS — N281 Cyst of kidney, acquired: Secondary | ICD-10-CM

## 2013-05-09 DIAGNOSIS — Z6841 Body Mass Index (BMI) 40.0 and over, adult: Secondary | ICD-10-CM | POA: Diagnosis not present

## 2013-05-09 DIAGNOSIS — D126 Benign neoplasm of colon, unspecified: Secondary | ICD-10-CM | POA: Diagnosis not present

## 2013-05-09 DIAGNOSIS — E1149 Type 2 diabetes mellitus with other diabetic neurological complication: Secondary | ICD-10-CM | POA: Diagnosis not present

## 2013-05-09 DIAGNOSIS — I4891 Unspecified atrial fibrillation: Secondary | ICD-10-CM | POA: Diagnosis not present

## 2013-05-09 DIAGNOSIS — R109 Unspecified abdominal pain: Secondary | ICD-10-CM

## 2013-05-09 DIAGNOSIS — R140 Abdominal distension (gaseous): Secondary | ICD-10-CM

## 2013-05-09 DIAGNOSIS — E785 Hyperlipidemia, unspecified: Secondary | ICD-10-CM | POA: Diagnosis not present

## 2013-05-09 DIAGNOSIS — I10 Essential (primary) hypertension: Secondary | ICD-10-CM | POA: Diagnosis not present

## 2013-05-12 ENCOUNTER — Ambulatory Visit
Admission: RE | Admit: 2013-05-12 | Discharge: 2013-05-12 | Disposition: A | Discharge status: Home / Self Care | Payer: Medicare Other | Source: Ambulatory Visit | Attending: Endocrinology | Admitting: Endocrinology

## 2013-05-12 DIAGNOSIS — N281 Cyst of kidney, acquired: Secondary | ICD-10-CM

## 2013-05-12 DIAGNOSIS — R109 Unspecified abdominal pain: Secondary | ICD-10-CM

## 2013-05-12 DIAGNOSIS — R140 Abdominal distension (gaseous): Secondary | ICD-10-CM

## 2014-01-23 DIAGNOSIS — E109 Type 1 diabetes mellitus without complications: Secondary | ICD-10-CM | POA: Diagnosis not present

## 2014-01-23 DIAGNOSIS — Z91199 Patient's noncompliance with other medical treatment and regimen due to unspecified reason: Secondary | ICD-10-CM | POA: Diagnosis not present

## 2014-01-23 DIAGNOSIS — Z7901 Long term (current) use of anticoagulants: Secondary | ICD-10-CM | POA: Diagnosis not present

## 2014-01-23 DIAGNOSIS — E785 Hyperlipidemia, unspecified: Secondary | ICD-10-CM | POA: Diagnosis not present

## 2014-01-23 DIAGNOSIS — Z9119 Patient's noncompliance with other medical treatment and regimen: Secondary | ICD-10-CM | POA: Diagnosis not present

## 2014-01-23 DIAGNOSIS — I10 Essential (primary) hypertension: Secondary | ICD-10-CM | POA: Diagnosis not present

## 2014-01-23 DIAGNOSIS — I2510 Atherosclerotic heart disease of native coronary artery without angina pectoris: Secondary | ICD-10-CM | POA: Diagnosis not present

## 2014-01-23 DIAGNOSIS — I4891 Unspecified atrial fibrillation: Secondary | ICD-10-CM | POA: Diagnosis not present

## 2014-02-10 ENCOUNTER — Encounter: Payer: Self-pay | Admitting: Gastroenterology

## 2014-06-21 ENCOUNTER — Encounter: Payer: Self-pay | Admitting: Gastroenterology

## 2014-07-31 DIAGNOSIS — M19071 Primary osteoarthritis, right ankle and foot: Secondary | ICD-10-CM | POA: Diagnosis not present

## 2014-07-31 DIAGNOSIS — Z6841 Body Mass Index (BMI) 40.0 and over, adult: Secondary | ICD-10-CM | POA: Diagnosis not present

## 2014-07-31 DIAGNOSIS — M7731 Calcaneal spur, right foot: Secondary | ICD-10-CM | POA: Diagnosis not present

## 2014-07-31 DIAGNOSIS — M86179 Other acute osteomyelitis, unspecified ankle and foot: Secondary | ICD-10-CM | POA: Diagnosis not present

## 2014-07-31 DIAGNOSIS — E1140 Type 2 diabetes mellitus with diabetic neuropathy, unspecified: Secondary | ICD-10-CM | POA: Diagnosis not present

## 2014-07-31 DIAGNOSIS — E1165 Type 2 diabetes mellitus with hyperglycemia: Secondary | ICD-10-CM | POA: Diagnosis not present

## 2014-07-31 DIAGNOSIS — E11621 Type 2 diabetes mellitus with foot ulcer: Secondary | ICD-10-CM | POA: Diagnosis not present

## 2014-08-13 DIAGNOSIS — E11621 Type 2 diabetes mellitus with foot ulcer: Secondary | ICD-10-CM | POA: Diagnosis not present

## 2014-08-13 DIAGNOSIS — Z6841 Body Mass Index (BMI) 40.0 and over, adult: Secondary | ICD-10-CM | POA: Diagnosis not present

## 2014-08-13 DIAGNOSIS — E1140 Type 2 diabetes mellitus with diabetic neuropathy, unspecified: Secondary | ICD-10-CM | POA: Diagnosis not present

## 2014-08-23 ENCOUNTER — Encounter (HOSPITAL_COMMUNITY): Payer: Self-pay | Admitting: Interventional Cardiology

## 2015-02-15 ENCOUNTER — Emergency Department (HOSPITAL_COMMUNITY)
Admission: EM | Admit: 2015-02-15 | Discharge: 2015-02-15 | Disposition: A | Discharge status: Home / Self Care | Payer: Medicare Other | Attending: Emergency Medicine | Admitting: Emergency Medicine

## 2015-02-15 ENCOUNTER — Emergency Department (HOSPITAL_COMMUNITY): Payer: Medicare Other

## 2015-02-15 ENCOUNTER — Encounter (HOSPITAL_COMMUNITY): Payer: Self-pay | Admitting: Emergency Medicine

## 2015-02-15 DIAGNOSIS — R739 Hyperglycemia, unspecified: Secondary | ICD-10-CM

## 2015-02-15 DIAGNOSIS — E1165 Type 2 diabetes mellitus with hyperglycemia: Secondary | ICD-10-CM | POA: Diagnosis not present

## 2015-02-15 DIAGNOSIS — Z7902 Long term (current) use of antithrombotics/antiplatelets: Secondary | ICD-10-CM | POA: Insufficient documentation

## 2015-02-15 DIAGNOSIS — Z7982 Long term (current) use of aspirin: Secondary | ICD-10-CM | POA: Insufficient documentation

## 2015-02-15 DIAGNOSIS — E780 Pure hypercholesterolemia: Secondary | ICD-10-CM | POA: Insufficient documentation

## 2015-02-15 DIAGNOSIS — R4182 Altered mental status, unspecified: Secondary | ICD-10-CM | POA: Diagnosis present

## 2015-02-15 DIAGNOSIS — I4891 Unspecified atrial fibrillation: Secondary | ICD-10-CM | POA: Diagnosis not present

## 2015-02-15 DIAGNOSIS — I10 Essential (primary) hypertension: Secondary | ICD-10-CM | POA: Insufficient documentation

## 2015-02-15 DIAGNOSIS — Z794 Long term (current) use of insulin: Secondary | ICD-10-CM | POA: Diagnosis not present

## 2015-02-15 DIAGNOSIS — E669 Obesity, unspecified: Secondary | ICD-10-CM | POA: Insufficient documentation

## 2015-02-15 DIAGNOSIS — Z79899 Other long term (current) drug therapy: Secondary | ICD-10-CM | POA: Diagnosis not present

## 2015-02-15 LAB — CBC WITH DIFFERENTIAL/PLATELET
BASOS ABS: 0 10*3/uL (ref 0.0–0.1)
Basophils Relative: 0 % (ref 0–1)
EOS ABS: 0 10*3/uL (ref 0.0–0.7)
EOS PCT: 0 % (ref 0–5)
HCT: 40.9 % (ref 36.0–46.0)
Hemoglobin: 13.5 g/dL (ref 12.0–15.0)
Lymphocytes Relative: 14 % (ref 12–46)
Lymphs Abs: 1.4 10*3/uL (ref 0.7–4.0)
MCH: 29.6 pg (ref 26.0–34.0)
MCHC: 33 g/dL (ref 30.0–36.0)
MCV: 89.7 fL (ref 78.0–100.0)
MONO ABS: 0.8 10*3/uL (ref 0.1–1.0)
Monocytes Relative: 8 % (ref 3–12)
NEUTROS ABS: 7.9 10*3/uL — AB (ref 1.7–7.7)
Neutrophils Relative %: 78 % — ABNORMAL HIGH (ref 43–77)
PLATELETS: 216 10*3/uL (ref 150–400)
RBC: 4.56 MIL/uL (ref 3.87–5.11)
RDW: 13.5 % (ref 11.5–15.5)
WBC: 10.1 10*3/uL (ref 4.0–10.5)

## 2015-02-15 LAB — I-STAT ARTERIAL BLOOD GAS, ED
Acid-base deficit: 2 mmol/L (ref 0.0–2.0)
Bicarbonate: 23.2 mEq/L (ref 20.0–24.0)
O2 SAT: 94 %
PCO2 ART: 40.1 mmHg (ref 35.0–45.0)
PH ART: 7.37 (ref 7.350–7.450)
TCO2: 24 mmol/L (ref 0–100)
pO2, Arterial: 74 mmHg — ABNORMAL LOW (ref 80.0–100.0)

## 2015-02-15 LAB — URINE MICROSCOPIC-ADD ON

## 2015-02-15 LAB — BASIC METABOLIC PANEL
Anion gap: 14 (ref 5–15)
BUN: 22 mg/dL — AB (ref 6–20)
CALCIUM: 9 mg/dL (ref 8.9–10.3)
CHLORIDE: 89 mmol/L — AB (ref 101–111)
CO2: 23 mmol/L (ref 22–32)
CREATININE: 1.59 mg/dL — AB (ref 0.44–1.00)
GFR calc non Af Amer: 33 mL/min — ABNORMAL LOW (ref >60)
GFR, EST AFRICAN AMERICAN: 38 mL/min — AB (ref >60)
Glucose, Bld: 794 mg/dL (ref 65–99)
POTASSIUM: 4.4 mmol/L (ref 3.5–5.1)
SODIUM: 126 mmol/L — AB (ref 135–145)

## 2015-02-15 LAB — CBG MONITORING, ED: GLUCOSE-CAPILLARY: 377 mg/dL — AB (ref 65–99)

## 2015-02-15 LAB — URINALYSIS, ROUTINE W REFLEX MICROSCOPIC
Bilirubin Urine: NEGATIVE
Glucose, UA: >1000 mg/dL — AB
Hgb urine dipstick: NEGATIVE
Ketones, ur: 15 mg/dL — AB
LEUKOCYTES UA: NEGATIVE
NITRITE: NEGATIVE
PH: 5.5 (ref 5.0–8.0)
Protein, ur: NEGATIVE mg/dL
SPECIFIC GRAVITY, URINE: 1.03 (ref 1.005–1.030)
Urobilinogen, UA: 0.2 mg/dL (ref 0.0–1.0)

## 2015-02-15 LAB — TROPONIN I: Troponin I: <0.03 ng/mL (ref <0.031)

## 2015-02-15 MED ORDER — BLOOD GLUCOSE MONITOR KIT: PACK | Status: DC

## 2015-02-15 MED ORDER — SODIUM CHLORIDE 0.9 % IV BOLUS (SEPSIS)
1000.0000 mL | Freq: Once | INTRAVENOUS | Status: AC
  Administered 2015-02-15: 1000 mL via INTRAVENOUS

## 2015-02-15 MED ORDER — INSULIN ASPART 100 UNIT/ML ~~LOC~~ SOLN: 15.0000 [IU] | Freq: Once | SUBCUTANEOUS | Status: DC

## 2015-02-15 MED ORDER — INSULIN ASPART 100 UNIT/ML ~~LOC~~ SOLN: 5.0000 [IU] | Freq: Three times a day (TID) | SUBCUTANEOUS | Status: DC

## 2015-02-15 MED ORDER — INSULIN GLARGINE 100 UNIT/ML ~~LOC~~ SOLN: 15.0000 [IU] | Freq: Every day | SUBCUTANEOUS | Status: DC

## 2015-02-15 MED ORDER — INSULIN ASPART 100 UNIT/ML ~~LOC~~ SOLN
10.0000 [IU] | Freq: Once | SUBCUTANEOUS | Status: AC
  Administered 2015-02-15: 10 [IU] via INTRAVENOUS
  Filled 2015-02-15: qty 1

## 2015-02-15 NOTE — ED Notes (Signed)
CBG registered HI on glucometer >600

## 2015-02-15 NOTE — ED Provider Notes (Signed)
CSN: [id number]     Arrival date & time 02/15/15  1133 History   None    Chief Complaint  Patient presents with  . Altered Mental Status   HPI  Jenny Glover is a 69yo woman with PMHx of HTN, AFib on Xarelto, CAD s/p stent to the PDA in 2013, Type 2 DM, and hyperlipidemia who presents to the ED with confusion. Patient's husband has accompanied her and states that he has noticed worsening confusion over the past few weeks. Patient was also babysitting grandchildren today and noted to be staggering. Patient is alert and oriented to self, place, and time. She is able to tell me her birthday. She states she has not been taking her medications for the past few weeks due to forgetting. She reports she has not taken her insulin today. She denies fever, chills, chest pain, dyspnea, abdominal pain, nausea, vomiting, and dysuria.   Past Medical History  Diagnosis Date  . Atrial fibrillation     s/p DCCV 2005, 2006. Placed on Tikosyn 2006 (negative adenosine cardiolite 03/2005, could not measure EF).  . Hypertension   . Diabetes mellitus without complication   . Obesity   . High cholesterol    Past Surgical History  Procedure Laterality Date  . Tenotomy    . Left heart catheterization with coronary angiogram N/A 08/23/2012    Procedure: LEFT HEART CATHETERIZATION WITH CORONARY ANGIOGRAM;  Surgeon: Jettie Booze, MD;  Location: [facility] CATH LAB;  Service: Cardiovascular;  Laterality: N/A;  . Percutaneous coronary stent intervention (pci-s)  08/23/2012    Procedure: PERCUTANEOUS CORONARY STENT INTERVENTION (PCI-S);  Surgeon: Jettie Booze, MD;  Location: [facility] CATH LAB;  Service: Cardiovascular;;   No family history on file. History  Substance Use Topics  . Smoking status: Never Smoker   . Smokeless tobacco: Not on file  . Alcohol Use: No   OB History    No data available     Review of Systems General: Denies night sweats, changes in weight, changes in appetite HEENT: Denies headaches, ear  pain, changes in vision, rhinorrhea, sore throat CV: Denies CP, palpitations, SOB, orthopnea Pulm: Denies SOB, cough, wheezing GI: Denies abdominal pain, nausea, vomiting, diarrhea, constipation, melena, hematochezia GU: Denies dysuria, hematuria, frequency Msk: Denies muscle cramps, joint pains Neuro: Denies weakness, numbness, tingling Skin: Denies rashes, bruising   Allergies  Review of patient's allergies indicates no known allergies.  Home Medications   Prior to Admission medications   Medication Sig Start Date End Date Taking? Authorizing Provider  aspirin 81 MG chewable tablet Chew 162 mg by mouth daily.    Historical Provider, MD  atorvastatin (LIPITOR) 40 MG tablet Take 1 tablet (40 mg total) by mouth daily at 6 PM. 08/24/12   Reynold Bowen, MD  clopidogrel (PLAVIX) 75 MG tablet Take 1 tablet (75 mg total) by mouth daily with breakfast. 08/24/12   Reynold Bowen, MD  insulin aspart (NOVOLOG) 100 UNIT/ML injection Inject 0-15 Units into the skin 3 (three) times daily with meals. 08/24/12   Reynold Bowen, MD  insulin glargine (LANTUS) 100 UNIT/ML injection Inject 20 Units into the skin at bedtime. 08/24/12   Reynold Bowen, MD  lisinopril (PRINIVIL,ZESTRIL) 20 MG tablet Take 1 tablet (20 mg total) by mouth daily. 08/24/12   Reynold Bowen, MD  metoprolol (LOPRESSOR) 50 MG tablet Take 1 tablet (50 mg total) by mouth 2 (two) times daily. 08/24/12   Reynold Bowen, MD  Rivaroxaban (XARELTO) 20 MG TABS Take 1 tablet (20 mg total)  by mouth daily. 08/24/12   Reynold Bowen, MD   BP 158/78 mmHg  Pulse 88  Temp(Src) 98 F (36.7 C) (Oral)  Resp 21  Ht 5\' 9"  (1.753 m)  Wt 240 lb (108.863 kg)  BMI 35.43 kg/m2  SpO2 97% Physical Exam General: elderly woman sitting up in bed, NAD HEENT: /AT, EOMI, sclera anicteric, mucus membranes moist CV: RRR, no m/g/r Pulm: CTA bilaterally, breaths non-labored Abd: BS+, soft, non-tender, non-distended  Ext: warm, no edema Neuro: alert and oriented  x 3. PERRL, EOMI. Smile symmetric. Strength intact and symmetric in all extremities. Finger to nose normal.   ED Course  Procedures (including critical care time) Labs Review Labs Reviewed  BASIC METABOLIC PANEL - Abnormal; Notable for the following:    Sodium 126 (*)    Chloride 89 (*)    Glucose, Bld 794 (*)    BUN 22 (*)    Creatinine, Ser 1.59 (*)    GFR calc non Af Amer 33 (*)    GFR calc Af Amer 38 (*)    All other components within normal limits  CBC WITH DIFFERENTIAL/PLATELET - Abnormal; Notable for the following:    Neutrophils Relative % 78 (*)    Neutro Abs 7.9 (*)    All other components within normal limits  URINALYSIS, ROUTINE W REFLEX MICROSCOPIC (NOT AT [facility]) - Abnormal; Notable for the following:    Glucose, UA >1000 (*)    Ketones, ur 15 (*)    All other components within normal limits  URINE MICROSCOPIC-ADD ON - Abnormal; Notable for the following:    Squamous Epithelial / LPF FEW (*)    All other components within normal limits  CBG MONITORING, ED - Abnormal; Notable for the following:    Glucose-Capillary >600 (*)    All other components within normal limits  I-STAT ARTERIAL BLOOD GAS, ED - Abnormal; Notable for the following:    pO2, Arterial 74.0 (*)    All other components within normal limits  CBG MONITORING, ED - Abnormal; Notable for the following:    Glucose-Capillary >600 (*)    All other components within normal limits  TROPONIN I  BLOOD GAS, ARTERIAL    Imaging Review Dg Chest 2 View  02/15/2015   CLINICAL DATA:  Initial encounter for altered mental status with hypoglycemia.  EXAM: CHEST  2 VIEW  COMPARISON:  08/21/2012.  FINDINGS: The lungs are clear without focal infiltrate, edema, pneumothorax or pleural effusion. Interstitial markings are diffusely coarsened with chronic features. The cardio pericardial silhouette is enlarged. Imaged bony structures of the thorax are intact.  IMPRESSION: Chronic interstitial coarsening.  No acute  cardiopulmonary process.   Electronically Signed   By: Misty Stanley M.D.   On: 02/15/2015 14:31     EKG Interpretation   Date/Time:  Friday February 15 2015 11:46:19 EDT Ventricular Rate:  89 PR Interval:    QRS Duration: 102 QT Interval:  368 QTC Calculation: 448 R Axis:   -66 Text Interpretation:  Atrial fibrillation Incomplete RBBB and LAFB  Abnormal R-wave progression, late transition Probable left ventricular  hypertrophy Confirmed by DOCHERTY  MD, [place] ([id number]) on 02/15/2015 12:01:51  PM      MDM   Final diagnoses:  None   Patient presenting with confusion and staggering according to family members. Patient is alert and oriented x 3 on exam. She was found to have an elevated blood glucose of > 600 secondary to medication noncompliance as patient reported not taking her insulin for  a few weeks. Concern that she may be in DKA. Will check ABG, bmet and start IVF.   ABG completely normal. Bicarb normal at 23, no anion gap. No infection present on UA or CXR. WBC count normal. After two 1 L boluses, repeat CBG still >600. Will give 10 units Novolog.   Repeat CBG 377. Will discharge on Lantus 15 units QHS and Novolog 5 units TID with meals. Recommended to family to follow up with PCP early next week. Educated family on how to adjust novolog if blood sugars continue to be high.   Albin Felling, MD IMT[facility] PGY-1 Pager: [phone]    Juliet Rude, MD 02/15/15 [place], MD 02/16/15 [phone]

## 2015-02-15 NOTE — ED Notes (Signed)
Per husband, pt has been getting more confused over the past few months. Husband noticed severe confusion last night. No N/V, no abd pain, no chest pain. Pt reports some sweating. Husband states pt is excessively thirsty. Pt is diabetic, CBG noted to be >600

## 2015-02-15 NOTE — ED Notes (Signed)
  CBG 377

## 2015-02-15 NOTE — Discharge Instructions (Signed)
- Start Lantus 15 units at bedtime - Start Novolog 5 units three times daily with meals - If blood sugars continue to be elevated (greater than 300), then increase Lantus to 17 units at bedtime  - Follow up with your primary care doctor early next week        Hyperglycemia Hyperglycemia occurs when the glucose (sugar) in your blood is too high. Hyperglycemia can happen for many reasons, but it most often happens to people who do not know they have diabetes or are not managing their diabetes properly.  CAUSES  Whether you have diabetes or not, there are other causes of hyperglycemia. Hyperglycemia can occur when you have diabetes, but it can also occur in other situations that you might not be as aware of, such as: Diabetes  If you have diabetes and are having problems controlling your blood glucose, hyperglycemia could occur because of some of the following reasons:  Not following your meal plan.  Not taking your diabetes medications or not taking it properly.  Exercising less or doing less activity than you normally do.  Being sick. Pre-diabetes  This cannot be ignored. Before people develop Type 2 diabetes, they almost always have "pre-diabetes." This is when your blood glucose levels are higher than normal, but not yet high enough to be diagnosed as diabetes. Research has shown that some long-term damage to the body, especially the heart and circulatory system, may already be occurring during pre-diabetes. If you take action to manage your blood glucose when you have pre-diabetes, you may delay or prevent Type 2 diabetes from developing. Stress  If you have diabetes, you may be "diet" controlled or on oral medications or insulin to control your diabetes. However, you may find that your blood glucose is higher than usual in the hospital whether you have diabetes or not. This is often referred to as "stress hyperglycemia." Stress can elevate your blood glucose. This happens because of  hormones put out by the body during times of stress. If stress has been the cause of your high blood glucose, it can be followed regularly by your caregiver. That way he/she can make sure your hyperglycemia does not continue to get worse or progress to diabetes. Steroids  Steroids are medications that act on the infection fighting system (immune system) to block inflammation or infection. One side effect can be a rise in blood glucose. Most people can produce enough extra insulin to allow for this rise, but for those who cannot, steroids make blood glucose levels go even higher. It is not unusual for steroid treatments to "uncover" diabetes that is developing. It is not always possible to determine if the hyperglycemia will go away after the steroids are stopped. A special blood test called an A1c is sometimes done to determine if your blood glucose was elevated before the steroids were started. SYMPTOMS  Thirsty.  Frequent urination.  Dry mouth.  Blurred vision.  Tired or fatigue.  Weakness.  Sleepy.  Tingling in feet or leg. DIAGNOSIS  Diagnosis is made by monitoring blood glucose in one or all of the following ways:  A1c test. This is a chemical found in your blood.  Fingerstick blood glucose monitoring.  Laboratory results. TREATMENT  First, knowing the cause of the hyperglycemia is important before the hyperglycemia can be treated. Treatment may include, but is not be limited to:  Education.  Change or adjustment in medications.  Change or adjustment in meal plan.  Treatment for an illness, infection, etc.  More frequent blood glucose monitoring.  Change in exercise plan.  Decreasing or stopping steroids.  Lifestyle changes. HOME CARE INSTRUCTIONS   Test your blood glucose as directed.  Exercise regularly. Your caregiver will give you instructions about exercise. Pre-diabetes or diabetes which comes on with stress is helped by exercising.  Eat wholesome,  balanced meals. Eat often and at regular, fixed times. Your caregiver or nutritionist will give you a meal plan to guide your sugar intake.  Being at an ideal weight is important. If needed, losing as little as 10 to 15 pounds may help improve blood glucose levels. SEEK MEDICAL CARE IF:   You have questions about medicine, activity, or diet.  You continue to have symptoms (problems such as increased thirst, urination, or weight gain). SEEK IMMEDIATE MEDICAL CARE IF:   You are vomiting or have diarrhea.  Your breath smells fruity.  You are breathing faster or slower.  You are very sleepy or incoherent.  You have numbness, tingling, or pain in your feet or hands.  You have chest pain.  Your symptoms get worse even though you have been following your caregiver's orders.  If you have any other questions or concerns. Document Released: 02/24/2001 Document Revised: 11/23/2011 Document Reviewed: 12/28/2011 [facility] Patient Information 2015 [place], [place]. This information is not intended to replace advice given to you by your health care provider. Make sure you discuss any questions you have with your health care provider.

## 2015-02-18 DIAGNOSIS — E1140 Type 2 diabetes mellitus with diabetic neuropathy, unspecified: Secondary | ICD-10-CM | POA: Diagnosis not present

## 2015-02-18 DIAGNOSIS — E785 Hyperlipidemia, unspecified: Secondary | ICD-10-CM | POA: Diagnosis not present

## 2015-02-18 DIAGNOSIS — I2510 Atherosclerotic heart disease of native coronary artery without angina pectoris: Secondary | ICD-10-CM | POA: Diagnosis not present

## 2015-02-18 DIAGNOSIS — I10 Essential (primary) hypertension: Secondary | ICD-10-CM | POA: Diagnosis not present

## 2015-02-18 DIAGNOSIS — E11621 Type 2 diabetes mellitus with foot ulcer: Secondary | ICD-10-CM | POA: Diagnosis not present

## 2015-02-18 DIAGNOSIS — Z6838 Body mass index (BMI) 38.0-38.9, adult: Secondary | ICD-10-CM | POA: Diagnosis not present

## 2015-02-18 DIAGNOSIS — I480 Paroxysmal atrial fibrillation: Secondary | ICD-10-CM | POA: Diagnosis not present

## 2015-02-18 DIAGNOSIS — E1165 Type 2 diabetes mellitus with hyperglycemia: Secondary | ICD-10-CM | POA: Diagnosis not present

## 2015-02-20 DIAGNOSIS — N281 Cyst of kidney, acquired: Secondary | ICD-10-CM | POA: Diagnosis not present

## 2015-02-20 DIAGNOSIS — R930 Abnormal findings on diagnostic imaging of skull and head, not elsewhere classified: Secondary | ICD-10-CM | POA: Diagnosis not present

## 2015-02-20 DIAGNOSIS — E11621 Type 2 diabetes mellitus with foot ulcer: Secondary | ICD-10-CM | POA: Diagnosis not present

## 2015-02-20 DIAGNOSIS — R413 Other amnesia: Secondary | ICD-10-CM | POA: Diagnosis not present

## 2015-02-20 DIAGNOSIS — E1140 Type 2 diabetes mellitus with diabetic neuropathy, unspecified: Secondary | ICD-10-CM | POA: Diagnosis not present

## 2015-02-20 DIAGNOSIS — G629 Polyneuropathy, unspecified: Secondary | ICD-10-CM | POA: Diagnosis not present

## 2015-02-20 DIAGNOSIS — Z6837 Body mass index (BMI) 37.0-37.9, adult: Secondary | ICD-10-CM | POA: Diagnosis not present

## 2015-02-20 DIAGNOSIS — R4182 Altered mental status, unspecified: Secondary | ICD-10-CM | POA: Diagnosis not present

## 2015-02-20 DIAGNOSIS — E559 Vitamin D deficiency, unspecified: Secondary | ICD-10-CM | POA: Diagnosis not present

## 2015-02-20 DIAGNOSIS — I10 Essential (primary) hypertension: Secondary | ICD-10-CM | POA: Diagnosis not present

## 2015-02-21 ENCOUNTER — Ambulatory Visit (INDEPENDENT_AMBULATORY_CARE_PROVIDER_SITE_OTHER): Payer: Medicare Other | Admitting: Podiatry

## 2015-02-21 ENCOUNTER — Encounter: Payer: Self-pay | Admitting: Podiatry

## 2015-02-21 VITALS — BP 128/74 | HR 66 | Resp 12

## 2015-02-21 DIAGNOSIS — E0842 Diabetes mellitus due to underlying condition with diabetic polyneuropathy: Secondary | ICD-10-CM | POA: Diagnosis not present

## 2015-02-21 DIAGNOSIS — M2042 Other hammer toe(s) (acquired), left foot: Secondary | ICD-10-CM | POA: Diagnosis not present

## 2015-02-21 NOTE — Progress Notes (Signed)
   Subjective:    Patient ID: Jenny Glover, female    DOB: August 04, 1946, 69 y.o.   MRN: [id number]  Diabetes  Had a blood pressure spike at 800 but is currently back at 72. Needs circulation checked as well as L 2nd toe.    Review of Systems     Objective:   Physical Exam        Assessment & Plan:

## 2015-02-22 NOTE — Progress Notes (Signed)
Subjective:     Patient ID: Jenny Glover, female   DOB: 22-Jun-1946, 69 y.o.   MRN: [id number]  HPI patient has had long-term diabetes with a terrible spiked 800 and has not been under control and is concerned as her feet hurt and also that there is movement of the left second toe   Review of Systems  All other systems reviewed and are negative.      Objective:   Physical Exam  Constitutional: She is oriented to person, place, and time.  Cardiovascular: Intact distal pulses.   Musculoskeletal: Normal range of motion.  Neurological: She is oriented to person, place, and time.  Skin: Skin is warm and dry.  Nursing note and vitals reviewed.  neurovascular status intact muscle strength adequate range of motion within normal limits with patient having significant rotation second toe left with irritation of the dorsal surface and also noted to have digital deformities bilateral. I did note diminishment of sharp Dole vibratory bilateral and she is not in good mental status currently     Assessment:     Reviewed condition and discussed importance of diabetic control and importance of daily inspections of her feet and reviewed with caregiver    Plan:     H&P performed and I do think diabetic shoes would be of benefit to this patient and she is scanned for diabetic shoes. She'll be seen back when those are returned and at this time I did debride her nails which are thick and they do become painful 1-5 both feet and I advised on daily inspections

## 2015-03-04 ENCOUNTER — Other Ambulatory Visit: Payer: Self-pay | Admitting: Internal Medicine

## 2015-03-07 DIAGNOSIS — E1165 Type 2 diabetes mellitus with hyperglycemia: Secondary | ICD-10-CM | POA: Diagnosis not present

## 2015-03-07 DIAGNOSIS — F329 Major depressive disorder, single episode, unspecified: Secondary | ICD-10-CM | POA: Diagnosis not present

## 2015-03-07 DIAGNOSIS — Z6838 Body mass index (BMI) 38.0-38.9, adult: Secondary | ICD-10-CM | POA: Diagnosis not present

## 2015-03-07 DIAGNOSIS — I10 Essential (primary) hypertension: Secondary | ICD-10-CM | POA: Diagnosis not present

## 2015-03-13 ENCOUNTER — Other Ambulatory Visit: Payer: Self-pay | Admitting: Endocrinology

## 2015-03-13 DIAGNOSIS — I639 Cerebral infarction, unspecified: Secondary | ICD-10-CM

## 2015-03-14 ENCOUNTER — Encounter: Payer: Self-pay | Admitting: Neurology

## 2015-03-14 ENCOUNTER — Ambulatory Visit (INDEPENDENT_AMBULATORY_CARE_PROVIDER_SITE_OTHER): Payer: Medicare Other | Admitting: Neurology

## 2015-03-14 VITALS — BP 144/72 | HR 78 | Resp 16 | Ht 69.0 in | Wt 260.0 lb

## 2015-03-14 DIAGNOSIS — F329 Major depressive disorder, single episode, unspecified: Secondary | ICD-10-CM | POA: Diagnosis not present

## 2015-03-14 DIAGNOSIS — R413 Other amnesia: Secondary | ICD-10-CM

## 2015-03-14 DIAGNOSIS — I482 Chronic atrial fibrillation, unspecified: Secondary | ICD-10-CM

## 2015-03-14 DIAGNOSIS — E669 Obesity, unspecified: Secondary | ICD-10-CM | POA: Diagnosis not present

## 2015-03-14 DIAGNOSIS — R351 Nocturia: Secondary | ICD-10-CM | POA: Diagnosis not present

## 2015-03-14 DIAGNOSIS — E1165 Type 2 diabetes mellitus with hyperglycemia: Secondary | ICD-10-CM

## 2015-03-14 DIAGNOSIS — I639 Cerebral infarction, unspecified: Secondary | ICD-10-CM

## 2015-03-14 DIAGNOSIS — R600 Localized edema: Secondary | ICD-10-CM

## 2015-03-14 DIAGNOSIS — F32A Depression, unspecified: Secondary | ICD-10-CM

## 2015-03-14 DIAGNOSIS — G4733 Obstructive sleep apnea (adult) (pediatric): Secondary | ICD-10-CM | POA: Diagnosis not present

## 2015-03-14 DIAGNOSIS — R4182 Altered mental status, unspecified: Secondary | ICD-10-CM

## 2015-03-14 DIAGNOSIS — IMO0002 Reserved for concepts with insufficient information to code with codable children: Secondary | ICD-10-CM

## 2015-03-14 NOTE — Progress Notes (Addendum)
Subjective:    Patient ID: Jenny Glover is a 69 y.o. female.  HPI     Star Age, MD, PhD [facility] [address], [address] [place], [place] [address]  Dear Dr. Forde Dandy,   I saw your patient, Jenny Glover, upon your kind request in my neurologic clinic today for initial consultation of her mental status changes or memory loss. The patient is accompanied by her husband and her daughter today. As you know, Jenny Glover is a 69 year old right-handed woman with an underlying complex medical history of chronic A. fib, hypertension, hyperlipidemia, uncontrolled diabetes with evidence of diabetic neuropathy and most recent hemoglobin A1c of 9.9 on 02/18/2015, heart disease, status post stent placement in December 2013, and obesity, who has had memory loss in the past few months. I reviewed your office note from 02/28/2015. At that time she had an MMSE of 23 out of 30, clock drawing was 3 out of 4, animal fluency was 12/m. She has had recent blood work through your office including TSH, free T4, vitamin D, vitamin B12, RPR, but results are not available for my review. You also ordered a brain MRI with and without contrast, which she has not yet been scheduled for. She presented to the emergency room on 02/15/2015 because of confusion. Her family reported a several week history of confusion she had stopped taking all her medications. Of note, she has had a prior hospitalization from 08/21/2012 through 08/24/2012 at which time she had also reportedly stopped taking all her medications. She had a stent placement at the time. I reviewed her hospital records from her December 2013 admission as well as the emergency room records from 02/15/2015. Her blood sugar values were above 600, deemed secondary to noncompliance. ABG was normal. She was treated with 2 L of IV fluids, as well as insulin, and discharged to home. She reportedly stopped taking her medications perhaps weeks or  months ago. She is not clear about why and when she stopped taking her medications. She has had symptoms of depression for the past few months. She was recently started on Lexapro. She seems to tolerated well. She recently had a head CT which per family showed an old stroke. The CT results are not available for my review today. Her husband reports that he was told to start her on over-the-counter vitamin D as her vitamin D level was low. Of note, she snores, she also makes abnormal sounds in her sleep and has been noted to have pauses in her breathing. She has significant lower extremity swelling. She also has been referred to a counselor.  03/15/2015: I reviewed blood test results from 02/18/2015: Vitamin B12 750, RPR nonreactive, TSH 0.73, free T4 1 0.2, vitamin D low at 25, total cholesterol 297, triglycerides 233, LDL 202, blood sugar level 346, BUN 22, creatinine 1.2, sodium 134, otherwise BMP unremarkable, CBC with differential unremarkable, hemoglobin A1c 9.9. She is scheduled for a brain MRI on 03/27/2015 as far as I can tell.  Her Past Medical History Is Significant For: Past Medical History  Diagnosis Date  . Atrial fibrillation     s/p DCCV 2005, 2006. Placed on Tikosyn 2006 (negative adenosine cardiolite 03/2005, could not measure EF).  . Hypertension   . Diabetes mellitus without complication   . Obesity   . High cholesterol   . Hyperlipemia   . History of cardioversion 2005/2006  . Skin cancer of face     treated with radiation  .  Mental status change   . Diabetic foot ulcer   . Neuropathy   . Anxiety   . Depression     Her Past Surgical History Is Significant For: Past Surgical History  Procedure Laterality Date  . Tenotomy    . Left heart catheterization with coronary angiogram N/A 08/23/2012    Procedure: LEFT HEART CATHETERIZATION WITH CORONARY ANGIOGRAM;  Surgeon: Jettie Booze, MD;  Location: [facility] CATH LAB;  Service: Cardiovascular;  Laterality: N/A;  . Percutaneous  coronary stent intervention (pci-s)  08/23/2012    Procedure: PERCUTANEOUS CORONARY STENT INTERVENTION (PCI-S);  Surgeon: Jettie Booze, MD;  Location: [facility] CATH LAB;  Service: Cardiovascular;;    Her Family History Is Significant For: Family History  Problem Relation Age of Onset  . Diabetes Father   . Coronary artery disease Father   . Stroke Mother     Her Social History Is Significant For: History   Social History  . Marital Status: Married    Spouse Name: N/A  . Number of Children: 3  . Years of Education: College    Occupational History  . retired Marine scientist    Social History Main Topics  . Smoking status: Never Smoker   . Smokeless tobacco: Not on file  . Alcohol Use: No  . Drug Use: No  . Sexual Activity: Not on file   Other Topics Concern  . None   Social History Narrative   Occasionally drinks coffee, tea, soda     Her Allergies Are:  No Known Allergies:   Her Current Medications Are:  Outpatient Encounter Prescriptions as of 03/14/2015  Medication Sig  . amLODipine (NORVASC) 5 MG tablet Take 5 mg by mouth daily.  Marland Kitchen atorvastatin (LIPITOR) 40 MG tablet Take 1 tablet (40 mg total) by mouth daily at 6 PM.  . blood glucose meter kit and supplies KIT Dispense based on patient and insurance preference. Use up to four times daily as directed. (FOR ICD-9 250.00, 250.01).  . Escitalopram Oxalate (LEXAPRO PO) Take by mouth.  . insulin aspart (NOVOLOG) 100 UNIT/ML injection Inject 5 Units into the skin 3 (three) times daily with meals.  . insulin glargine (LANTUS) 100 UNIT/ML injection Inject 0.15 mLs (15 Units total) into the skin at bedtime.  . Rivaroxaban (XARELTO) 20 MG TABS Take 1 tablet (20 mg total) by mouth daily.  . [DISCONTINUED] clopidogrel (PLAVIX) 75 MG tablet Take 1 tablet (75 mg total) by mouth daily with breakfast.  . [DISCONTINUED] metoprolol succinate (TOPROL-XL) 100 MG 24 hr tablet Take 100 mg by mouth daily. Take with or immediately following a  meal.   No facility-administered encounter medications on file as of 03/14/2015.  :  Review of Systems:  Out of a complete 14 point review of systems, all are reviewed and negative with the exception of these symptoms as listed below:    Review of Systems  Eyes:       Blurred vision   Cardiovascular: Positive for leg swelling.  Neurological:       Memory loss, Old stroke seen on CT scan  Psychiatric/Behavioral:       Depression     Objective:  Neurologic Exam  Physical Exam Physical Examination:   Filed Vitals:   03/14/15 1113  BP: 144/72  Pulse: 78  Resp: 16   General Examination: The patient is a very pleasant 69 y.o. female in no acute distress. She is obese and adequately dressed. She is rather quiet today. Most of her history is provided  by her husband and her daughter. The patient answers questions in very short sentences or one-word sentences.   HEENT: Normocephalic, atraumatic, pupils are equal, round and reactive to light and accommodation. Funduscopic exam is normal with sharp disc margins noted. Extraocular tracking is good without limitation to gaze excursion or nystagmus noted. Normal smooth pursuit is noted. Hearing is grossly intact. Face is symmetric with normal facial animation and normal facial sensation. Speech is clear with no dysarthria noted. There is no hypophonia. There is no lip, neck/head, jaw or voice tremor. Neck is supple with full range of passive and active motion. There are no carotid bruits on auscultation. Oropharynx exam reveals: mild mouth dryness, adequate dental hygiene and moderate airway crowding, due to tonsils in place, redundant soft palate and larger tongue as well as narrow airway entry. Mallampati is class II. Tongue protrudes centrally and palate elevates symmetrically.   Chest: Clear to auscultation without wheezing, rhonchi or crackles noted.  Heart: S1+S2+0, irregularly irregular with a systolic murmur noted.   Abdomen: Soft,  non-tender and non-distended with normal bowel sounds appreciated on auscultation.  Extremities: There is 2+ pitting edema in the distal lower extremities bilaterally. Pedal pulses are intact.  Skin: Warm and dry without trophic changes noted. There are no varicose veins.  Musculoskeletal: exam reveals no obvious joint deformities, tenderness or joint swelling or erythema.   Neurologically:  Mental status: The patient is awake, alert and oriented in all 4 spheres. Her immediate and remote memory, attention, language skills and fund of knowledge are  mildly abnormal. Speech is scanned but not dysarthric and not hypophonic. She appears depressed.  On 03/14/2015: Her MMSE is 22 out of 30, clock drawing is 4 out of 4, animal fluency is 13/m.   Cranial nerves II - XII are as described above under HEENT exam. In addition: shoulder shrug is normal with equal shoulder height noted. Motor exam: Normal bulk, strength and tone is noted. There is no drift, tremor or rebound. Romberg is negative. Reflexes are 2+ throughout. Babinski: Toes are flexor bilaterally. Fine motor skills and coordination: intact with normal finger taps, normal hand movements, normal rapid alternating patting, normal foot taps and normal foot agility.  Cerebellar testing: No dysmetria or intention tremor on finger to nose testing. Heel to shin is unremarkable bilaterally. There is no truncal or gait ataxia.  Sensory exam: intact to light touch, pinprick, vibration, temperature sense in the upper extremities with decreased sensation to temperature, vibration and particularly pinprick sensation in the distal lower extremities bilaterally, probably up to below knees bilaterally.  Gait, station and balance: She stands with difficulty. No veering to one side is noted. No leaning to one side is noted. Posture is age-appropriate and stance is narrow based. Gait shows cautious gait. Tandem walk is not possible.  Assessment and Plan:    In  summary, Jenny Glover is a very pleasant 69 y.o.-year old female with an underlying complex medical history of chronic A. fib, hypertension, hyperlipidemia, uncontrolled diabetes with evidence of diabetic neuropathy and most recent hemoglobin A1c of 9.9 on 02/18/2015, heart disease, status post stent placement in December 2013, and obesity, who has had memory loss in the past few months or perhaps even years and mental status changes for the past few weeks and intermittently perhaps even longer. She is at risk for vascular dementia. She has had complete medical noncompliance in the recent past and also in the more distant past. A recent head CT presumably showed an old  stroke. She certainly has significant vascular risk factors and stroke risk factors. In addition, her history and physical exam are highly concerning for underlying obstructive sleep apnea. She is supposed to be scheduled for a brain MRI. We will await results. She had recent blood work which was appropriate and I will request test results from your office. While I did talk to them about medical management of dementia, we talked about secondary stroke prevention at length as well as the importance of medical compliance. She is advised to try to pursue weight loss as well and we will pursue a sleep study and treat obstructive sleep apnea as necessary. Her neurological exam is also in keeping with diabetic neuropathy. I had a long chat with the patient and her family about my findings and the diagnosis of memory loss and dementia, as well as mental status changes in the context of metabolic derangements. We talked about maintaining a healthy lifestyle in general and staying active mentally and physically. I encouraged the patient to eat healthy, exercise daily and keep well hydrated, to keep a scheduled bedtime and wake time routine, to not skip any meals and eat healthy snacks in between meals and to have protein with every meal. I stressed the  importance of regular exercise, within of course the patient's own mobility limitations. I encouraged the patient to keep up with current events by reading the news paper or watching the news and to do word puzzles, or if feasible, to go on [email or web address].   As far as further diagnostic testing is concerned, I suggested the following: Sleep study potentially with CPAP treatment as necessary. A brain MRI has already been ordered by you. We will await results on her recent blood work as well as her brain scan. I will see her back soon. I answered all their questions today and the patient and her family were in agreement with the above outlined plan.  Thank you very much for allowing me to participate in the care of this nice patient. If I can be of any further assistance to you please do not hesitate to call me at [phone].  Sincerely,   Star Age, MD, PhD

## 2015-03-14 NOTE — Patient Instructions (Signed)
We will wait for the MRI.   Continue exercising regularly and take your medications as directed. As discussed, secondary prevention is key after a stroke. This means: taking care of blood sugar values or diabetes management, good blood pressure (hypertension) control and optimizing cholesterol management, exercising daily or regularly within your own mobility limitations of course, and overall cardiovascular risk factor reduction, which includes screening for and treatment of obstructive sleep apnea (OSA) and weight management.   Based on your symptoms and your exam I believe you are at risk for obstructive sleep apnea or OSA, and I think we should proceed with a sleep study to determine whether you do or do not have OSA and how severe it is. If you have more than mild OSA, I want you to consider treatment with CPAP. Please remember, the risks and ramifications of moderate to severe obstructive sleep apnea or OSA are: Cardiovascular disease, including congestive heart failure, stroke, difficult to control hypertension, arrhythmias, and even type 2 diabetes has been linked to untreated OSA. Sleep apnea causes disruption of sleep and sleep deprivation in most cases, which, in turn, can cause recurrent headaches, problems with memory, mood, concentration, focus, and vigilance. Most people with untreated sleep apnea report excessive daytime sleepiness, which can affect their ability to drive. Please do not drive if you feel sleepy.   I will likely see you back after your sleep study to go over the test results and where to go from there. We will call you after your sleep study to advise about the results (most likely, you will hear from Beverlee Nims, my nurse) and to set up an appointment at the time, as necessary.    Our sleep lab administrative assistant, Arrie Aran will meet with you or call you to schedule your sleep study. If you don't hear back from her by next week please feel free to call her at [phone]. This is  her direct line and please leave a message with your phone number to call back if you get the voicemail box. She will call back as soon as possible.

## 2015-03-21 DIAGNOSIS — L821 Other seborrheic keratosis: Secondary | ICD-10-CM | POA: Diagnosis not present

## 2015-03-21 DIAGNOSIS — D485 Neoplasm of uncertain behavior of skin: Secondary | ICD-10-CM | POA: Diagnosis not present

## 2015-03-21 DIAGNOSIS — C44319 Basal cell carcinoma of skin of other parts of face: Secondary | ICD-10-CM | POA: Diagnosis not present

## 2015-03-21 DIAGNOSIS — D1801 Hemangioma of skin and subcutaneous tissue: Secondary | ICD-10-CM | POA: Diagnosis not present

## 2015-03-27 ENCOUNTER — Ambulatory Visit
Admission: RE | Admit: 2015-03-27 | Discharge: 2015-03-27 | Disposition: A | Discharge status: Home / Self Care | Payer: Medicare Other | Source: Ambulatory Visit | Attending: Endocrinology | Admitting: Endocrinology

## 2015-03-27 DIAGNOSIS — I639 Cerebral infarction, unspecified: Secondary | ICD-10-CM | POA: Diagnosis not present

## 2015-03-27 MED ORDER — GADOBENATE DIMEGLUMINE 529 MG/ML IV SOLN: 20.0000 mL | Freq: Once | INTRAVENOUS | Status: AC | PRN

## 2015-04-01 ENCOUNTER — Telehealth: Payer: Self-pay | Admitting: Neurology

## 2015-04-01 NOTE — Telephone Encounter (Signed)
Left message to call back  

## 2015-04-01 NOTE — Telephone Encounter (Signed)
Please call patient or her husband regarding her brain MRI:   I received a copy of her brain MRI results from 03/27/2015 and reviewed the results: While she may have discussed results with her primary care physician, Dr. Forde Dandy, who ordered the study, I would like to reiterate that her brain MRI showed evidence of old strokes as well as mild to moderate generalized atrophy (brain volume loss). In addition, there is evidence of a more recent stroke, but no acute changes.  It is imperative that she follow-up closely with her primary care physician regarding her stroke risk factors. As discussed during our office visit, secondary prevention is key after a stroke. This means: taking care of blood sugar values or diabetes management, good blood pressure (hypertension) control and optimizing cholesterol management, exercising daily or regularly within your own mobility limitations of course, and overall cardiovascular risk factor reduction, which includes screening for and treatment of obstructive sleep apnea (OSA) and weight management.   She is scheduled for sleep study on 04/03/2015. We will be in touch after the sleep study.

## 2015-04-02 ENCOUNTER — Telehealth: Payer: Self-pay | Admitting: Neurology

## 2015-04-02 NOTE — Telephone Encounter (Signed)
Pt husband Elenore Rota returned your call about Gamila please call back dg

## 2015-04-02 NOTE — Telephone Encounter (Signed)
I spoke to patient's husband and he is aware of results. He reports they are f/u with Dr. Forde Dandy and will come to sleep study tomorrow.

## 2015-04-03 ENCOUNTER — Ambulatory Visit (INDEPENDENT_AMBULATORY_CARE_PROVIDER_SITE_OTHER): Payer: Medicare Other | Admitting: Neurology

## 2015-04-03 DIAGNOSIS — R9431 Abnormal electrocardiogram [ECG] [EKG]: Secondary | ICD-10-CM

## 2015-04-03 DIAGNOSIS — G4733 Obstructive sleep apnea (adult) (pediatric): Secondary | ICD-10-CM

## 2015-04-03 DIAGNOSIS — G479 Sleep disorder, unspecified: Secondary | ICD-10-CM

## 2015-04-03 DIAGNOSIS — G4761 Periodic limb movement disorder: Secondary | ICD-10-CM

## 2015-04-03 NOTE — Sleep Study (Signed)
Please see the scanned sleep study interpretation located in the Procedure tab within the Chart Review section. 

## 2015-04-08 DIAGNOSIS — C44319 Basal cell carcinoma of skin of other parts of face: Secondary | ICD-10-CM | POA: Diagnosis not present

## 2015-04-10 DIAGNOSIS — F331 Major depressive disorder, recurrent, moderate: Secondary | ICD-10-CM | POA: Diagnosis not present

## 2015-04-10 DIAGNOSIS — F0150 Vascular dementia without behavioral disturbance: Secondary | ICD-10-CM | POA: Diagnosis not present

## 2015-04-15 ENCOUNTER — Telehealth: Payer: Self-pay | Admitting: Neurology

## 2015-04-15 DIAGNOSIS — G4733 Obstructive sleep apnea (adult) (pediatric): Secondary | ICD-10-CM

## 2015-04-15 NOTE — Telephone Encounter (Signed)
I spoke to husband. He is aware of results and states that patient is willing to proceed with CPAP tx. I will send referral to DME company. I will also fax sleep study to PCP. Unable to make f/u appt. I will send patient a letter to remind her to make f/u appt and explain the importance of compliance.

## 2015-04-15 NOTE — Telephone Encounter (Signed)
Patient seen on 03/14/15, split study on 04/03/15. Ins: Medicare.  Beverlee Nims:   Please call and notify patient that the recent sleep study confirmed the diagnosis of moderate OSA. She did very well with CPAP during the study with significant improvement of the respiratory events. Therefore, I would like start the patient on CPAP therapy at home by prescribing a machine for home use. I placed the order in the chart. The patient will need a follow up appointment with me in 8 to 10 weeks post set up that has to be scheduled; please go ahead and schedule while you have the patient on the phone and make sure patient understands the importance of keeping this window for the FU appointment, as it is often an insurance requirement and failing to adhere to this may result in losing coverage for sleep apnea treatment. 15 min follow-up should suffice, unless there is a 30 min FU slot available.  Please re-enforce the importance of compliance with treatment and the need for [place] to monitor compliance data - again an insurance requirement and good feedback for the patient as far as how they are doing.  Also remind patient, that any upcoming CPAP machine or mask issues, should be first addressed with the DME company. Please ask if patient has a preference regarding DME company.  Please arrange for CPAP set up at home through a DME company of patient's choice - once you have spoken to the patient - and faxed/routed report to PCP and referring MD (if other than PCP), you can close this encounter, thanks,   Star Age, MD, PhD [facility] ([place])

## 2015-04-22 DIAGNOSIS — C44319 Basal cell carcinoma of skin of other parts of face: Secondary | ICD-10-CM | POA: Diagnosis not present

## 2015-04-25 DIAGNOSIS — F331 Major depressive disorder, recurrent, moderate: Secondary | ICD-10-CM | POA: Diagnosis not present

## 2015-04-25 DIAGNOSIS — F0150 Vascular dementia without behavioral disturbance: Secondary | ICD-10-CM | POA: Diagnosis not present

## 2015-05-07 DIAGNOSIS — E785 Hyperlipidemia, unspecified: Secondary | ICD-10-CM | POA: Diagnosis not present

## 2015-05-07 DIAGNOSIS — Z6838 Body mass index (BMI) 38.0-38.9, adult: Secondary | ICD-10-CM | POA: Diagnosis not present

## 2015-05-07 DIAGNOSIS — I2510 Atherosclerotic heart disease of native coronary artery without angina pectoris: Secondary | ICD-10-CM | POA: Diagnosis not present

## 2015-05-07 DIAGNOSIS — E1140 Type 2 diabetes mellitus with diabetic neuropathy, unspecified: Secondary | ICD-10-CM | POA: Diagnosis not present

## 2015-05-07 DIAGNOSIS — E1165 Type 2 diabetes mellitus with hyperglycemia: Secondary | ICD-10-CM | POA: Diagnosis not present

## 2015-05-07 DIAGNOSIS — E559 Vitamin D deficiency, unspecified: Secondary | ICD-10-CM | POA: Diagnosis not present

## 2015-05-07 DIAGNOSIS — I639 Cerebral infarction, unspecified: Secondary | ICD-10-CM | POA: Diagnosis not present

## 2015-05-07 DIAGNOSIS — E871 Hypo-osmolality and hyponatremia: Secondary | ICD-10-CM | POA: Diagnosis not present

## 2015-05-07 DIAGNOSIS — G4733 Obstructive sleep apnea (adult) (pediatric): Secondary | ICD-10-CM | POA: Diagnosis not present

## 2015-05-07 DIAGNOSIS — E1142 Type 2 diabetes mellitus with diabetic polyneuropathy: Secondary | ICD-10-CM | POA: Diagnosis not present

## 2015-05-07 DIAGNOSIS — F329 Major depressive disorder, single episode, unspecified: Secondary | ICD-10-CM | POA: Diagnosis not present

## 2015-05-07 DIAGNOSIS — F0150 Vascular dementia without behavioral disturbance: Secondary | ICD-10-CM | POA: Diagnosis not present

## 2015-05-23 DIAGNOSIS — C44319 Basal cell carcinoma of skin of other parts of face: Secondary | ICD-10-CM | POA: Diagnosis not present

## 2015-05-28 ENCOUNTER — Ambulatory Visit (INDEPENDENT_AMBULATORY_CARE_PROVIDER_SITE_OTHER): Payer: Medicare Other | Admitting: Neurology

## 2015-05-28 ENCOUNTER — Encounter: Payer: Self-pay | Admitting: Neurology

## 2015-05-28 VITALS — BP 132/78 | HR 62 | Resp 16 | Ht 69.0 in | Wt 263.0 lb

## 2015-05-28 DIAGNOSIS — I639 Cerebral infarction, unspecified: Secondary | ICD-10-CM

## 2015-05-28 DIAGNOSIS — E1165 Type 2 diabetes mellitus with hyperglycemia: Secondary | ICD-10-CM

## 2015-05-28 DIAGNOSIS — I482 Chronic atrial fibrillation, unspecified: Secondary | ICD-10-CM

## 2015-05-28 DIAGNOSIS — R413 Other amnesia: Secondary | ICD-10-CM

## 2015-05-28 DIAGNOSIS — G4733 Obstructive sleep apnea (adult) (pediatric): Secondary | ICD-10-CM | POA: Diagnosis not present

## 2015-05-28 DIAGNOSIS — Z9989 Dependence on other enabling machines and devices: Principal | ICD-10-CM

## 2015-05-28 DIAGNOSIS — R600 Localized edema: Secondary | ICD-10-CM

## 2015-05-28 DIAGNOSIS — IMO0002 Reserved for concepts with insufficient information to code with codable children: Secondary | ICD-10-CM

## 2015-05-28 NOTE — Patient Instructions (Signed)
Please continue using your CPAP regularly. While your insurance requires that you use CPAP at least 4 hours each night on 70% of the nights, I recommend, that you not skip any nights and use it throughout the night if you can. Getting used to CPAP and staying with the treatment long term does take time and patience and discipline. Untreated obstructive sleep apnea when it is moderate to severe can have an adverse impact on cardiovascular health and raise her risk for heart disease, arrhythmias, hypertension, congestive heart failure, stroke and diabetes. Untreated obstructive sleep apnea causes sleep disruption, nonrestorative sleep, and sleep deprivation. This can have an impact on your day to day functioning and cause daytime sleepiness and impairment of cognitive function, memory loss, mood disturbance, and problems focussing. Using CPAP regularly can improve these symptoms.  Continue exercising regularly and take your medications as directed. As discussed, secondary prevention is key after a stroke. This means: taking care of blood sugar values or diabetes management, good blood pressure (hypertension) control and optimizing cholesterol management, exercising daily or regularly within your own mobility limitations of course, and overall cardiovascular risk factor reduction, which includes screening for and treatment of obstructive sleep apnea (OSA) and weight management.   Please reduce and stop your sugary drinks. You need to drink more water. Try to eat at home as much is possible and you may want to utilize repeatable low calorie lunches and dinners as opposed to eating out.  You probably need to see a retina specialist because of your diabetes.  We will increase her CPAP pressure to 9 cm and we will ask your DME company to work with you regarding the high air leak.

## 2015-05-28 NOTE — Progress Notes (Signed)
Subjective:    Patient ID: Jenny Glover is a 69 y.o. female.  HPI      Interim history:   Jenny Glover is a 69 year old right-handed woman with an underlying complex medical history of chronic A. fib, hypertension, hyperlipidemia, uncontrolled diabetes with evidence of diabetic neuropathy and most recent hemoglobin A1c of 9.9 on 02/18/2015, heart disease, status post stent placement in December 2013, and obesity, who presents for follow-up consultation of her memory loss. The patient is accompanied by her husband today. I first met her on 03/14/2015 at the request of her primary care physician, at which time she reported a several month history of memory loss. She had had blood work and a brain MRI through her primary care physician. I suggested we monitor her symptoms because she had some metabolic derangements and had stopped taking her medications in the recent past. We talked about the importance of medical compliance. We talked about risk factors for stroke. 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 Tumor not considered likely in this case given the location and appearance which is typical for infarction.   Atrophy and chronic microvascular ischemia elsewhere as described above.  Her MMSE at the time was 22 out of 30, clock drawing was 4 out of 4 and animal fluency was 13/m. We talked about her vascular risk factors. Her exam was in keeping with diabetic neuropathy. I suggested we proceed with a sleep study. She had a split-night sleep study on 04/03/2015 and underwent over her test results with her in detail today. Sleep efficiency at baseline was 59% with a prolonged sleep latency of 59.5 minutes and wake after sleep onset of 8.5 minutes with mild sleep fragmentation noted. She had an increased percentage of slow-wave sleep and absence of REM sleep prior to CPAP initiation. She had A. fib on  EKG. Moderate PLMS were noted with no significant arousals. She had moderate to loud snoring. Total AHI was 47.9 per hour, average oxygen saturation 91%, nadir was 83%. She was then titrated and CPAP therapy. Sleep efficiency was 74.2% with a sleep latency of 8.5 minutes and wake after sleep onset of 66.5 minutes. She had slow-wave sleep at 34.6% and REM sleep at 13%. Average oxygen saturation was improved at 93%, nadir was 90%. Severe PLMS were noted with minimal arousals. CPAP was titrated from 5 cm to 8 cm. Her AHI was 1.1 on the final pressure with supine REM sleep achieved. Based on her test results are prescribed CPAP therapy for home use.  Today, 05/28/2015: I reviewed her CPAP compliance data from 04/27/2015 through 05/26/2015 which is a total of 30 days during which time she used her machine 30 days with percent used days greater than 4 hours at 93%, indicating excellent compliance with an average usage of 7 hours and 49 minutes, residual AHI elevated at 14.5 per hour, leak at times high with the 95th percentile at 41.4 L/m on a pressure of 8 cm with EPR of 3. Residual AHI details are not available as in obstructive versus central apneas.  Today, 05/28/2015: She reports doing better on CPAP. Her husband has noted that her urinary incontinence at night has improved. Sometimes she takes the mask off and does not realize it. He does not sleep in the same bedroom with her. Memory is about the same. Her diabetes numbers are better. She has not seen an ophthalmologist in years from what I understand.  She does not exercise. She likes to watch TV. Her husband states that she likes to drink milk, up to half a gallon per day, she also likes to drink lemonade and sweet tea. Unfortunately, she has gained a few pounds. She thought she had lost a few pounds.  Previously:  03/14/2015: She has had memory loss in the past few months. I reviewed your office note from 02/28/2015. At that time she had an MMSE of 23 out  of 30, clock drawing was 3 out of 4, animal fluency was 12/m. She has had recent blood work through your office including TSH, free T4, vitamin D, vitamin B12, RPR, but results are not available for my review. You also ordered a brain MRI with and without contrast, which she has not yet been scheduled for. She presented to the emergency room on 02/15/2015 because of confusion. Her family reported a several week history of confusion she had stopped taking all her medications. Of note, she has had a prior hospitalization from 08/21/2012 through 08/24/2012 at which time she had also reportedly stopped taking all her medications. She had a stent placement at the time. I reviewed her hospital records from her December 2013 admission as well as the emergency room records from 02/15/2015. Her blood sugar values were above 600, deemed secondary to noncompliance. ABG was normal. She was treated with 2 L of IV fluids, as well as insulin, and discharged to home. She reportedly stopped taking her medications perhaps weeks or months ago. She is not clear about why and when she stopped taking her medications. She has had symptoms of depression for the past few months. She was recently started on Lexapro. She seems to tolerated well. She recently had a head CT which per family showed an old stroke. The CT results are not available for my review today. Her husband reports that he was told to start her on over-the-counter vitamin D as her vitamin D level was low. Of note, she snores, she also makes abnormal sounds in her sleep and has been noted to have pauses in her breathing. She has significant lower extremity swelling. She also has been referred to a counselor.  03/15/2015: I reviewed blood test results from 02/18/2015: Vitamin B12 750, RPR nonreactive, TSH 0.73, free T4 1 0.2, vitamin D low at 25, total cholesterol 297, triglycerides 233, LDL 202, blood sugar level 346, BUN 22, creatinine 1.2, sodium 134, otherwise BMP  unremarkable, CBC with differential unremarkable, hemoglobin A1c 9.9. She is scheduled for a brain MRI on 03/27/2015 as far as I can tell.   Her Past Medical History Is Significant For: Past Medical History  Diagnosis Date  . Atrial fibrillation     s/p DCCV 2005, 2006. Placed on Tikosyn 2006 (negative adenosine cardiolite 03/2005, could not measure EF).  . Hypertension   . Diabetes mellitus without complication   . Obesity   . High cholesterol   . Hyperlipemia   . History of cardioversion 2005/2006  . Skin cancer of face     treated with radiation  . Mental status change   . Diabetic foot ulcer   . Neuropathy   . Anxiety   . Depression     Her Past Surgical History Is Significant For: Past Surgical History  Procedure Laterality Date  . Tenotomy    . Left heart catheterization with coronary angiogram N/A 08/23/2012    Procedure: LEFT HEART CATHETERIZATION WITH CORONARY ANGIOGRAM;  Surgeon: Jettie Booze, MD;  [facility] C[facility] LAB;  Service: Cardiovascular;  Laterality: N/A;  . Percutaneous coronary stent intervention (pci-s)  08/23/2012    Procedure: PERCUTANEOUS CORONARY STENT INTERVENTION (PCI-S);  Surgeon: Jettie Booze, MD;  Location: [facility] CATH LAB;  Service: Cardiovascular;;  . Skin cancer excision      Her Family History Is Significant For: Family History  Problem Relation Age of Onset  . Diabetes Father   . Coronary artery disease Father   . Stroke Mother     Her Social History Is Significant For: Social History   Social History  . Marital Status: Married    Spouse Name: N/A  . Number of Children: 3  . Years of Education: College    Occupational History  . retired Marine scientist    Social History Main Topics  . Smoking status: Never Smoker   . Smokeless tobacco: None  . Alcohol Use: No  . Drug Use: No  . Sexual Activity: Not Asked   Other Topics Concern  . None   Social History Narrative   Occasionally drinks coffee, tea, soda     Her  Allergies Are:  No Known Allergies:   Her Current Medications Are:  Outpatient Encounter Prescriptions as of 05/28/2015  Medication Sig  . amLODipine (NORVASC) 5 MG tablet Take 5 mg by mouth daily.  Marland Kitchen atorvastatin (LIPITOR) 40 MG tablet Take 1 tablet (40 mg total) by mouth daily at 6 PM.  . blood glucose meter kit and supplies KIT Dispense based on patient and insurance preference. Use up to four times daily as directed. (FOR ICD-9 250.00, 250.01).  . cholecalciferol (VITAMIN D) 1000 UNITS tablet Take 2,000 Units by mouth daily.  . clopidogrel (PLAVIX) 75 MG tablet Take 75 mg by mouth daily.  . Escitalopram Oxalate (LEXAPRO PO) Take by mouth.  . insulin glargine (LANTUS) 100 UNIT/ML injection Inject 0.15 mLs (15 Units total) into the skin at bedtime.  . insulin lispro (HUMALOG) 100 UNIT/ML injection Inject into the skin 3 (three) times daily before meals.  . metoprolol succinate (TOPROL-XL) 100 MG 24 hr tablet Take 100 mg by mouth daily. Take with or immediately following a meal.  . Rivaroxaban (XARELTO) 20 MG TABS Take 1 tablet (20 mg total) by mouth daily.  . [DISCONTINUED] insulin aspart (NOVOLOG) 100 UNIT/ML injection Inject 5 Units into the skin 3 (three) times daily with meals.   No facility-administered encounter medications on file as of 05/28/2015.  :  Review of Systems:  Out of a complete 14 point review of systems, all are reviewed and negative with the exception of these symptoms as listed below:  Review of Systems  Neurological:       Patient states that she is doing well with CPAP, reports that she feels better.     Objective:  Neurologic Exam  Physical Exam Physical Examination:   Filed Vitals:   05/28/15 0817  BP: 132/78  Pulse: 62  Resp: 16    eneral Examination: The patient is a very pleasant 69 y.o. female in no acute distress. She is obese and adequately dressed. She is more interactive today.  HEENT: Normocephalic, atraumatic, pupils are equal, round and  reactive to light and accommodation. Funduscopic exam is normal with sharp disc margins noted. Extraocular tracking is good without limitation to gaze excursion or nystagmus noted. Normal smooth pursuit is noted. Hearing is grossly intact. Face is symmetric with normal facial animation and normal facial sensation. Speech is clear with no dysarthria noted. There is no hypophonia. There is no lip, neck/head, jaw  or voice tremor. Neck is supple with full range of passive and active motion. There are no carotid bruits on auscultation. Oropharynx exam reveals: mild mouth dryness, adequate dental hygiene and moderate airway crowding, due to tonsils in place, redundant soft palate and larger tongue as well as narrow airway entry. Mallampati is class II. Tongue protrudes centrally and palate elevates symmetrically.   Chest: Clear to auscultation without wheezing, rhonchi or crackles noted.  Heart: S1+S2+0, irregularly irregular with a systolic murmur noted, unchanged.   Abdomen: Soft, non-tender and non-distended with normal bowel sounds appreciated on auscultation.  Extremities: There is 2+ pitting edema in the distal lower extremities bilaterally. Pedal pulses are intact.  Skin: Warm and dry without trophic changes noted, with the exception of purplish discoloration of her toes, left more than right. There are no varicose veins. She had a diffuse skin cancers removed from her left side of her face. She has a large bandage over the left lower face including the jaw and chin area on the L.  Musculoskeletal: exam reveals no obvious joint deformities, tenderness or joint swelling or erythema.   Neurologically:  Mental status: The patient is awake, alert and oriented in all 4 spheres. Her immediate and remote memory, attention, language skills and fund of knowledge are  mildly abnormal. She appears less depressed.   On 03/14/2015: Her MMSE is 22 out of 30, clock drawing is 4 out of 4, animal fluency is 13/m.    Cranial nerves II - XII are as described above under HEENT exam. In addition: shoulder shrug is normal with equal shoulder height noted. Motor exam: Normal bulk, strength and tone is noted. There is no drift, tremor or rebound. Romberg is negative. Reflexes are 2+ throughout. Babinski: Toes are flexor bilaterally. Fine motor skills and coordination: intact with normal finger taps, normal hand movements, normal rapid alternating patting, normal foot taps and normal foot agility.  Cerebellar testing: No dysmetria or intention tremor on finger to nose testing. Sensory exam: intact to light touch, pinprick, vibration, temperature sense in the upper extremities with decreased sensation to temperature, vibration and particularly pinprick sensation in the distal lower extremities bilaterally, probably up to below knees bilaterally.  Gait, station and balance: She stands with difficulty and has to push herself up. No veering to one side is noted. No leaning to one side is noted. Posture is age-appropriate and stance is narrow based. Gait shows cautious gait. Tandem walk is not possible.  Assessment and Plan:    In summary, RAIDYN BREINER is a very pleasant 69 year old female with an underlying complex medical history of chronic A. fib, hypertension, hyperlipidemia, uncontrolled diabetes with evidence of diabetic neuropathy and A1c of 9.9 on 02/18/2015, which her husband has improved to 7.6 or so, heart disease, status post stent placement in December 2013, and morbid obesity, as well as a recent diagnosis of severe obstructive sleep apnea, now established on CPAP therapy at home. She presents for follow-up consultation for her memory loss. We talked about her recent brain MRI which showed a subacute stroke. She has multiple stroke risk factors we talked about stroke secondary prevention at length again today. She has gained a little bit of weight. We talked about weight loss and changing lifestyle quite a bit as  well. She is advised to gradually reduce and stop her sugary drinks altogether. She needs to see an ophthalmologist as she has not seen one in years from what I understand. Her diabetes numbers have improved. She  is encouraged to continue to use CPAP regularly. I talked to them about her sleep study results in detail as well as her compliance data. She is commended for staying compliant with CPAP therapy. I would like to increase the pressure slightly to 9 cm because of residual sleep-disordered breathing. She has a high leak and we will try to address this with the help of her DME company. In addition, she currently also has a large Band-Aid that covers part of her lower face and may impair the seal from the mask. She indicates that she is using a fullface mask because of mouth breathing. She has a history of medical noncompliance and she is strongly encouraged to never stop her medications. Nevertheless, she appears to be on Plavix and Xarelto at this time and is encouraged to discuss with her cardiologist whether she needs to be on both. Her exam is fairly stable. We will continue to monitor her memory loss and I did not suggest any new medication as her memory loss may very well be a function of recent medical noncompliance, metabolic derangements, untreated obstructive sleep apnea, blood sugar fluctuations and suboptimal hydration. I would like to see her back in 4 months, sooner if the need arises. I answered all their questions today and the patient and her husband were in agreement. I spent 25 minutes in total face-to-face time with the patient, more than 50% of which was spent in counseling and coordination of care, reviewing test results, reviewing medication and discussing or reviewing the diagnosis of OSA and memory loss, stroke, the prognosis and treatment options.

## 2015-06-06 DIAGNOSIS — C44319 Basal cell carcinoma of skin of other parts of face: Secondary | ICD-10-CM | POA: Diagnosis not present

## 2015-06-07 DIAGNOSIS — Z7901 Long term (current) use of anticoagulants: Secondary | ICD-10-CM | POA: Diagnosis not present

## 2015-06-07 DIAGNOSIS — I482 Chronic atrial fibrillation: Secondary | ICD-10-CM | POA: Diagnosis not present

## 2015-06-07 DIAGNOSIS — I2510 Atherosclerotic heart disease of native coronary artery without angina pectoris: Secondary | ICD-10-CM | POA: Diagnosis not present

## 2015-06-07 DIAGNOSIS — E785 Hyperlipidemia, unspecified: Secondary | ICD-10-CM | POA: Diagnosis not present

## 2015-06-07 DIAGNOSIS — G4733 Obstructive sleep apnea (adult) (pediatric): Secondary | ICD-10-CM | POA: Diagnosis not present

## 2015-06-25 ENCOUNTER — Ambulatory Visit: Payer: Medicare Other | Admitting: *Deleted

## 2015-06-25 DIAGNOSIS — E0842 Diabetes mellitus due to underlying condition with diabetic polyneuropathy: Secondary | ICD-10-CM

## 2015-06-25 NOTE — Progress Notes (Signed)
Patient ID: Jenny Glover, female   DOB: [birth year], 69 y.o.   MRN: [id number] Patient presents for measurement and scanning for diabetic shoes

## 2015-07-04 DIAGNOSIS — C4401 Basal cell carcinoma of skin of lip: Secondary | ICD-10-CM | POA: Diagnosis not present

## 2015-07-26 ENCOUNTER — Ambulatory Visit: Payer: Medicare Other | Admitting: *Deleted

## 2015-07-26 DIAGNOSIS — E0842 Diabetes mellitus due to underlying condition with diabetic polyneuropathy: Secondary | ICD-10-CM

## 2015-07-26 NOTE — Progress Notes (Signed)
Patient ID: Jenny Glover, female   DOB: September 24, 1945, 69 y.o.   MRN: [id number] Patient presents for diabetic shoe pick up, shoes are tried on and are not a good fit will reorder.

## 2015-07-26 NOTE — Patient Instructions (Signed)

## 2015-07-31 DIAGNOSIS — F0390 Unspecified dementia without behavioral disturbance: Secondary | ICD-10-CM | POA: Diagnosis not present

## 2015-07-31 DIAGNOSIS — E1165 Type 2 diabetes mellitus with hyperglycemia: Secondary | ICD-10-CM | POA: Diagnosis not present

## 2015-07-31 DIAGNOSIS — E871 Hypo-osmolality and hyponatremia: Secondary | ICD-10-CM | POA: Diagnosis not present

## 2015-07-31 DIAGNOSIS — F0150 Vascular dementia without behavioral disturbance: Secondary | ICD-10-CM | POA: Diagnosis not present

## 2015-07-31 DIAGNOSIS — E1142 Type 2 diabetes mellitus with diabetic polyneuropathy: Secondary | ICD-10-CM | POA: Diagnosis not present

## 2015-07-31 DIAGNOSIS — I639 Cerebral infarction, unspecified: Secondary | ICD-10-CM | POA: Diagnosis not present

## 2015-07-31 DIAGNOSIS — Z1389 Encounter for screening for other disorder: Secondary | ICD-10-CM | POA: Diagnosis not present

## 2015-07-31 DIAGNOSIS — E1140 Type 2 diabetes mellitus with diabetic neuropathy, unspecified: Secondary | ICD-10-CM | POA: Diagnosis not present

## 2015-07-31 DIAGNOSIS — G4733 Obstructive sleep apnea (adult) (pediatric): Secondary | ICD-10-CM | POA: Diagnosis not present

## 2015-07-31 DIAGNOSIS — E559 Vitamin D deficiency, unspecified: Secondary | ICD-10-CM | POA: Diagnosis not present

## 2015-07-31 DIAGNOSIS — Z6839 Body mass index (BMI) 39.0-39.9, adult: Secondary | ICD-10-CM | POA: Diagnosis not present

## 2015-07-31 DIAGNOSIS — Z23 Encounter for immunization: Secondary | ICD-10-CM | POA: Diagnosis not present

## 2015-08-23 ENCOUNTER — Ambulatory Visit: Payer: Medicare Other

## 2015-08-27 ENCOUNTER — Ambulatory Visit: Payer: Medicare Other | Admitting: Podiatry

## 2015-08-27 DIAGNOSIS — M2042 Other hammer toe(s) (acquired), left foot: Secondary | ICD-10-CM

## 2015-08-27 DIAGNOSIS — H25813 Combined forms of age-related cataract, bilateral: Secondary | ICD-10-CM | POA: Diagnosis not present

## 2015-08-27 DIAGNOSIS — H524 Presbyopia: Secondary | ICD-10-CM | POA: Diagnosis not present

## 2015-08-27 DIAGNOSIS — E1040 Type 1 diabetes mellitus with diabetic neuropathy, unspecified: Secondary | ICD-10-CM | POA: Diagnosis not present

## 2015-08-27 DIAGNOSIS — E119 Type 2 diabetes mellitus without complications: Secondary | ICD-10-CM | POA: Diagnosis not present

## 2015-08-27 DIAGNOSIS — M2041 Other hammer toe(s) (acquired), right foot: Secondary | ICD-10-CM

## 2015-08-27 DIAGNOSIS — E0842 Diabetes mellitus due to underlying condition with diabetic polyneuropathy: Secondary | ICD-10-CM

## 2015-08-27 DIAGNOSIS — H5203 Hypermetropia, bilateral: Secondary | ICD-10-CM | POA: Diagnosis not present

## 2015-08-27 NOTE — Patient Instructions (Signed)

## 2015-09-30 ENCOUNTER — Encounter: Payer: Self-pay | Admitting: Neurology

## 2015-09-30 ENCOUNTER — Encounter (INDEPENDENT_AMBULATORY_CARE_PROVIDER_SITE_OTHER): Payer: Self-pay

## 2015-09-30 ENCOUNTER — Ambulatory Visit (INDEPENDENT_AMBULATORY_CARE_PROVIDER_SITE_OTHER): Payer: Medicare Other | Admitting: Neurology

## 2015-09-30 VITALS — BP 130/72 | HR 60 | Resp 16 | Ht 69.0 in | Wt 270.0 lb

## 2015-09-30 DIAGNOSIS — Z9989 Dependence on other enabling machines and devices: Secondary | ICD-10-CM

## 2015-09-30 DIAGNOSIS — G4733 Obstructive sleep apnea (adult) (pediatric): Secondary | ICD-10-CM

## 2015-09-30 DIAGNOSIS — I482 Chronic atrial fibrillation, unspecified: Secondary | ICD-10-CM

## 2015-09-30 DIAGNOSIS — F0390 Unspecified dementia without behavioral disturbance: Secondary | ICD-10-CM

## 2015-09-30 MED ORDER — DONEPEZIL HCL 5 MG PO TABS: 5.0000 mg | ORAL_TABLET | Freq: Every day | ORAL | Status: DC

## 2015-09-30 NOTE — Progress Notes (Signed)
Subjective:    Patient ID: Jenny Glover is a 70 y.o. female.  HPI     Interim history:   Jenny Glover is a 70 year old right-handed woman with an underlying complex medical history of chronic A. fib, hypertension, hyperlipidemia, uncontrolled diabetes with evidence of diabetic neuropathy and most recent hemoglobin A1c of 9.9 on 02/18/2015, heart disease, status post stent placement in December 2013, and obesity, who presents for follow-up consultation of her memory loss and OSA, on CPAP. The patient is accompanied by her husband today. I last saw her on 05/28/2015, at which time she reported doing better on CPAP. Her nocturia had improved. Her memory was stable. Her diabetes numbers had improved. She had not seen her eye doctor in years. She was not exercising. She was not drinking enough water but would drink a lot of milk. She had gained some weight. She was compliant with CPAP therapy. She make an appointment with an ophthalmologist and continue with CPAP regularly. I did not suggest any new medications.  Today, 09/30/2015: I reviewed her CPAP compliance data from 08/28/2015 through 09/26/2015 which is a total of 30 days during which time she used her CPAP 29 days with percent used days greater than 4 hours at 83%, indicating very good compliance with an average usage of 7 hours and 2 minutes, residual AHI mildly elevated at 9.6 per hour, leak at times high with the 95th percentile at 30.7 L/m on a pressure of 9 cm with EPR of 3.  Today, 09/30/2015: She reports doing fairly well, compliant with CPAP, memory stable. Her husband reports issues with urinary incontinence at night. She tried a bladder medication with samples from Dr. Forde Dandy for a month, without much improvement, so she stopped. She has to be on Plavix for her stent and on Xarelto for the A fib, per Dr. Oran Rein. She had her eyes checked. She no longer reads the paper per patient. He feels that her memory has not declined considerably in the  last 6 or 7 months. She drives very little, usually to her son's house. Her husband did let her try to town recently and thought she did okay. She does not feel motivated to exercise and does not always eat right. Her weight has increased some.  Previously:  I first met her on 03/14/2015 at the request of her primary care physician, at which time she reported a several month history of memory loss. She had had blood work and a brain MRI through her primary care physician. I suggested we monitor her symptoms because she had some metabolic derangements and had stopped taking her medications in the recent past. We talked about the importance of medical compliance. We talked about risk factors for stroke. 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 Tumor not considered likely in this case given the location and appearance which is typical for infarction.   Atrophy and chronic microvascular ischemia elsewhere as described above.  Her MMSE at the time was 22 out of 30, clock drawing was 4 out of 4 and animal fluency was 13/m. We talked about her vascular risk factors. Her exam was in keeping with diabetic neuropathy. I suggested we proceed with a sleep study. She had a split-night sleep study on 04/03/2015 and underwent over her test results with her in detail today. Sleep efficiency at baseline was 59% with a prolonged sleep latency of 59.5 minutes and wake after sleep onset  of 8.5 minutes with mild sleep fragmentation noted. She had an increased percentage of slow-wave sleep and absence of REM sleep prior to CPAP initiation. She had A. fib on EKG. Moderate PLMS were noted with no significant arousals. She had moderate to loud snoring. Total AHI was 47.9 per hour, average oxygen saturation 91%, nadir was 83%. She was then titrated and CPAP therapy. Sleep efficiency was 74.2% with a sleep latency of 8.5 minutes and  wake after sleep onset of 66.5 minutes. She had slow-wave sleep at 34.6% and REM sleep at 13%. Average oxygen saturation was improved at 93%, nadir was 90%. Severe PLMS were noted with minimal arousals. CPAP was titrated from 5 cm to 8 cm. Her AHI was 1.1 on the final pressure with supine REM sleep achieved. Based on her test results are prescribed CPAP therapy for home use.  I reviewed her CPAP compliance data from 04/27/2015 through 05/26/2015 which is a total of 30 days during which time she used her machine 30 days with percent used days greater than 4 hours at 93%, indicating excellent compliance with an average usage of 7 hours and 49 minutes, residual AHI elevated at 14.5 per hour, leak at times high with the 95th percentile at 41.4 L/m on a pressure of 8 cm with EPR of 3. Residual AHI details are not available as in obstructive versus central apneas.  03/14/2015: She has had memory loss in the past few months. I reviewed your office note from 02/28/2015. At that time she had an MMSE of 23 out of 30, clock drawing was 3 out of 4, animal fluency was 12/m. She has had recent blood work through your office including TSH, free T4, vitamin D, vitamin B12, RPR, but results are not available for my review. You also ordered a brain MRI with and without contrast, which she has not yet been scheduled for. She presented to the emergency room on 02/15/2015 because of confusion. Her family reported a several week history of confusion she had stopped taking all her medications. Of note, she has had a prior hospitalization from 08/21/2012 through 08/24/2012 at which time she had also reportedly stopped taking all her medications. She had a stent placement at the time. I reviewed her hospital records from her December 2013 admission as well as the emergency room records from 02/15/2015. Her blood sugar values were above 600, deemed secondary to noncompliance. ABG was normal. She was treated with 2 L of IV fluids, as well  as insulin, and discharged to home. She reportedly stopped taking her medications perhaps weeks or months ago. She is not clear about why and when she stopped taking her medications. She has had symptoms of depression for the past few months. She was recently started on Lexapro. She seems to tolerated well. She recently had a head CT which per family showed an old stroke. The CT results are not available for my review today. Her husband reports that he was told to start her on over-the-counter vitamin D as her vitamin D level was low. Of note, she snores, she also makes abnormal sounds in her sleep and has been noted to have pauses in her breathing. She has significant lower extremity swelling. She also has been referred to a counselor.  03/15/2015: I reviewed blood test results from 02/18/2015: Vitamin B12 750, RPR nonreactive, TSH 0.73, free T4 1 0.2, vitamin D low at 25, total cholesterol 297, triglycerides 233, LDL 202, blood sugar level 346, BUN 22, creatinine 1.2, sodium  134, otherwise BMP unremarkable, CBC with differential unremarkable, hemoglobin A1c 9.9. She is scheduled for a brain MRI on 03/27/2015 as far as I can tell.  Her Past Medical History Is Significant For: Past Medical History  Diagnosis Date  . Atrial fibrillation ([place])     s/p DCCV 2005, 2006. Placed on Tikosyn 2006 (negative adenosine cardiolite 03/2005, could not measure EF).  . Hypertension   . Diabetes mellitus without complication ([place])   . Obesity   . High cholesterol   . Hyperlipemia   . History of cardioversion 2005/2006  . Skin cancer of face     treated with radiation  . Mental status change   . Diabetic foot ulcer ([place])   . Neuropathy ([place])   . Anxiety   . Depression     Her Past Surgical History Is Significant For: Past Surgical History  Procedure Laterality Date  . Tenotomy    . Left heart catheterization with coronary angiogram N/A 08/23/2012    Procedure: LEFT HEART CATHETERIZATION WITH CORONARY  ANGIOGRAM;  Surgeon: Jettie Booze, MD;  Location: [facility] CATH LAB;  Service: Cardiovascular;  Laterality: N/A;  . Percutaneous coronary stent intervention (pci-s)  08/23/2012    Procedure: PERCUTANEOUS CORONARY STENT INTERVENTION (PCI-S);  Surgeon: Jettie Booze, MD;  Location: [facility] CATH LAB;  Service: Cardiovascular;;  . Skin cancer excision      Her Family History Is Significant For: Family History  Problem Relation Age of Onset  . Diabetes Father   . Coronary artery disease Father   . Stroke Mother     Her Social History Is Significant For: Social History   Social History  . Marital Status: Married    Spouse Name: N/A  . Number of Children: 3  . Years of Education: College    Occupational History  . retired Marine scientist    Social History Main Topics  . Smoking status: Never Smoker   . Smokeless tobacco: None  . Alcohol Use: No  . Drug Use: No  . Sexual Activity: Not Asked   Other Topics Concern  . None   Social History Narrative   Occasionally drinks coffee, tea, soda     Her Allergies Are:  No Known Allergies:   Her Current Medications Are:  Outpatient Encounter Prescriptions as of 09/30/2015  Medication Sig  . amLODipine (NORVASC) 5 MG tablet Take 5 mg by mouth daily.  Marland Kitchen atorvastatin (LIPITOR) 40 MG tablet Take 1 tablet (40 mg total) by mouth daily at 6 PM.  . blood glucose meter kit and supplies KIT Dispense based on patient and insurance preference. Use up to four times daily as directed. (FOR ICD-9 250.00, 250.01).  . cholecalciferol (VITAMIN D) 1000 UNITS tablet Take 2,000 Units by mouth daily.  . clopidogrel (PLAVIX) 75 MG tablet Take 75 mg by mouth daily.  . Escitalopram Oxalate (LEXAPRO PO) Take 10 mg by mouth.   . insulin glargine (LANTUS) 100 UNIT/ML injection Inject 0.15 mLs (15 Units total) into the skin at bedtime. (Patient taking differently: Inject 40 Units into the skin at bedtime. )  . insulin lispro (HUMALOG) 100 UNIT/ML injection Inject 10  Units into the skin 3 (three) times daily before meals.   . metoprolol succinate (TOPROL-XL) 100 MG 24 hr tablet Take 100 mg by mouth daily. Take with or immediately following a meal.  . Rivaroxaban (XARELTO) 20 MG TABS Take 1 tablet (20 mg total) by mouth daily.   No facility-administered encounter medications on file as of 09/30/2015.  :  Review of Systems:  Out of a complete 14 point review of systems, all are reviewed and negative with the exception of these symptoms as listed below:   Review of Systems  Neurological:       Patient is here for f/u. No new concerns. Husband reports that patient has trouble with urinary incontinence at night.     Objective:  Neurologic Exam  Physical Exam Physical Examination:   Filed Vitals:   09/30/15 1255  BP: 130/72  Pulse: 60  Resp: 16    General Examination: The patient is a very pleasant 70 y.o. female in no acute distress. She is obese and adequately dressed. She has had some weight gain.  HEENT: Normocephalic, atraumatic, pupils are equal, round and reactive to light and accommodation. Extraocular tracking is good without limitation to gaze excursion or nystagmus noted. Normal smooth pursuit is noted. Hearing is grossly intact. Face is symmetric with normal facial animation and normal facial sensation. Speech is clear with no dysarthria noted. There is no hypophonia. There is no lip, neck/head, jaw or voice tremor. Neck is supple with full range of passive and active motion. There are no carotid bruits on auscultation. Oropharynx exam reveals: mild mouth dryness, adequate dental hygiene and moderate airway crowding, due to tonsils in place, redundant soft palate and larger tongue as well as narrow airway entry. Mallampati is class II. Tongue protrudes centrally and palate elevates symmetrically.   Chest: Clear to auscultation without wheezing, rhonchi or crackles noted.  Heart: S1+S2+0, irregularly irregular with a systolic murmur noted,  unchanged.   Abdomen: Soft, non-tender and non-distended with normal bowel sounds appreciated on auscultation.  Extremities: There is trace pitting edema in the distal lower extremities bilaterally. Pedal pulses are intact.  Skin: Warm and dry without trophic changes noted. She had skin cancer removed from her left face.   Musculoskeletal: exam reveals no obvious joint deformities, tenderness or joint swelling or erythema.   Neurologically:  Mental status: The patient is awake, alert and oriented in all 4 spheres. Her immediate and remote memory, attention, language skills and fund of knowledge are  mildly abnormal. She appears less depressed.   On 03/14/2015: Her MMSE is 22 out of 30, clock drawing is 4 out of 4, animal fluency is 13/m.   On 09/30/2015: Her MMSE: 22/30, CDT: 4/4, AFT: 12/min.   Cranial nerves II - XII are as described above under HEENT exam. In addition: shoulder shrug is normal with equal shoulder height noted. Motor exam: Normal bulk, strength and tone is noted. There is no drift, tremor or rebound. Romberg is negative. Reflexes are 2+ throughout. Fine motor skills and coordination: intact with normal finger taps, normal hand movements, normal rapid alternating patting, normal foot taps and normal foot agility.  Cerebellar testing: No dysmetria or intention tremor on finger to nose testing. Sensory exam: intact to light touch in the upper extremities with decreased sensation to all modalities in the distal lower extremities bilaterally, probably up to below knees bilaterally.  Gait, station and balance: She stands with difficulty and has to push herself up. No veering to one side is noted. No leaning to one side is noted. Posture is age-appropriate and stance is narrow based. Gait shows cautious gait. Tandem walk is difficult for her.   Assessment and Plan:    In summary, XANA BRADT is a very pleasant 70 year old female with an underlying complex medical history of  chronic A. fib, hypertension, hyperlipidemia, difficult to control diabetes with  evidence of diabetic neuropathy and A1c of 9.9 on 02/18/2015, which has since then improved (currently around 7.1 per husband), heart disease, status post stent placement in December 2013, and morbid obesity, as well as a recent diagnosis of severe obstructive sleep apnea, established on CPAP therapy, who presents for follow-up consultation for her memory loss and obstructive sleep apnea. She is compliant with CPAP therapy and commended for this. Her brain MRI from 2016 had shown a subacute stroke and we talked about the test results and our recent visits. She does have multiple vascular risk factors. She has also gained a little bit of weight again. We talked about weight loss and the importance of exercise. She is encouraged to drink more water as she does not drink enough water. She had an eye exam in the interim. Her diabetes numbers have improved. Her memory scores are stable. Nevertheless, given her risk factor profile I suggested starting her on low-dose Aricept. We talked about potential side effects including GI related side effects, as well as bradycardia. I provided her with a prescription and suggested a three-month checkup with one of our nurse practitioners to make sure she has been able to tolerate the medication and to consider increasing it to 10 mg once daily. I will see her after that. Otherwise, her exam is stable.  Per cardiology, she has to stay on Plavix because she has a cardiac stent and Xarelto because she has A. fib.  I answered all their questions today and the patient and her husband were in agreement. I spent 25 minutes in total face-to-face time with the patient, more than 50% of which was spent in counseling and coordination of care, reviewing test results, reviewing medication and discussing or reviewing the diagnosis of OSA and memory loss, stroke, the prognosis and treatment options.

## 2015-09-30 NOTE — Patient Instructions (Addendum)
For your memory loss, I would like for you to try low dose Aricept (generic name: donepezil) 5 mg: take one pill each evening. Common side effects include dry eyes, dry mouth, confusion, low pulse, low blood pressure and rare side effects include hallucinations.   Keep up the good work! I will see you back in 6 months for sleep apnea check up, and if you continue to do well on CPAP I will see you once a year thereafter.   Please start exercising. Please drink more water.   We will see you back in 3 months with our nurse practitioner at which time we can try to increase the Aricept to 10 mg daily.   I will see you back in 6 mouth.

## 2015-10-02 DIAGNOSIS — L821 Other seborrheic keratosis: Secondary | ICD-10-CM | POA: Diagnosis not present

## 2015-10-02 DIAGNOSIS — D225 Melanocytic nevi of trunk: Secondary | ICD-10-CM | POA: Diagnosis not present

## 2015-10-02 DIAGNOSIS — D1801 Hemangioma of skin and subcutaneous tissue: Secondary | ICD-10-CM | POA: Diagnosis not present

## 2015-10-02 DIAGNOSIS — Z85828 Personal history of other malignant neoplasm of skin: Secondary | ICD-10-CM | POA: Diagnosis not present

## 2015-10-28 ENCOUNTER — Ambulatory Visit (INDEPENDENT_AMBULATORY_CARE_PROVIDER_SITE_OTHER): Payer: Medicare Other | Admitting: Podiatry

## 2015-10-28 ENCOUNTER — Encounter: Payer: Self-pay | Admitting: Podiatry

## 2015-10-28 DIAGNOSIS — M2042 Other hammer toe(s) (acquired), left foot: Secondary | ICD-10-CM | POA: Diagnosis not present

## 2015-10-28 DIAGNOSIS — L600 Ingrowing nail: Secondary | ICD-10-CM

## 2015-10-28 DIAGNOSIS — E0842 Diabetes mellitus due to underlying condition with diabetic polyneuropathy: Secondary | ICD-10-CM

## 2015-10-29 NOTE — Progress Notes (Signed)
Subjective:     Patient ID: Jenny Glover, female   DOB: 12-Jul-1946, 70 y.o.   MRN: [id number]  HPI patient presents with thickened hallux nail left and is just concerned about the discoloration and she is a diabetic and poor health   Review of Systems     Objective:   Physical Exam  neurovascular status unchanged with patient having moderate varicosities in the ankle and mild ankle edema that's normal. Patient's found to have a thickened hallux nail left that's dystrophic but is recent in its orientation and appearance    Assessment:      probable trauma to the left hallux nail creating underlying bleeding but no indications currently of advanced drainage or proximal edema erythema    Plan:      reviewed condition and recommended evaluation and watch it and any pain were to occur drainage lifting of the nail plate or other pathology patient is to let us know immediately

## 2015-12-04 ENCOUNTER — Ambulatory Visit: Payer: Medicare Other | Admitting: *Deleted

## 2015-12-04 DIAGNOSIS — E0842 Diabetes mellitus due to underlying condition with diabetic polyneuropathy: Secondary | ICD-10-CM

## 2015-12-04 NOTE — Progress Notes (Signed)
Patient ID: Jenny Glover, female   DOB: 1946-07-15, 70 y.o.   MRN: [id number] Patient presents to be scanned and measured for diabetic shoes and inserts.

## 2015-12-17 DIAGNOSIS — I480 Paroxysmal atrial fibrillation: Secondary | ICD-10-CM | POA: Diagnosis not present

## 2015-12-17 DIAGNOSIS — E871 Hypo-osmolality and hyponatremia: Secondary | ICD-10-CM | POA: Diagnosis not present

## 2015-12-17 DIAGNOSIS — Z1389 Encounter for screening for other disorder: Secondary | ICD-10-CM | POA: Diagnosis not present

## 2015-12-17 DIAGNOSIS — I638 Other cerebral infarction: Secondary | ICD-10-CM | POA: Diagnosis not present

## 2015-12-17 DIAGNOSIS — G4733 Obstructive sleep apnea (adult) (pediatric): Secondary | ICD-10-CM | POA: Diagnosis not present

## 2015-12-17 DIAGNOSIS — F0150 Vascular dementia without behavioral disturbance: Secondary | ICD-10-CM | POA: Diagnosis not present

## 2015-12-17 DIAGNOSIS — N183 Chronic kidney disease, stage 3 (moderate): Secondary | ICD-10-CM | POA: Diagnosis not present

## 2015-12-17 DIAGNOSIS — I10 Essential (primary) hypertension: Secondary | ICD-10-CM | POA: Diagnosis not present

## 2015-12-17 DIAGNOSIS — E1142 Type 2 diabetes mellitus with diabetic polyneuropathy: Secondary | ICD-10-CM | POA: Diagnosis not present

## 2015-12-17 DIAGNOSIS — E784 Other hyperlipidemia: Secondary | ICD-10-CM | POA: Diagnosis not present

## 2015-12-17 DIAGNOSIS — Z6841 Body Mass Index (BMI) 40.0 and over, adult: Secondary | ICD-10-CM | POA: Diagnosis not present

## 2015-12-17 DIAGNOSIS — E559 Vitamin D deficiency, unspecified: Secondary | ICD-10-CM | POA: Diagnosis not present

## 2015-12-30 ENCOUNTER — Ambulatory Visit (INDEPENDENT_AMBULATORY_CARE_PROVIDER_SITE_OTHER): Payer: Medicare Other | Admitting: Nurse Practitioner

## 2015-12-30 ENCOUNTER — Encounter: Payer: Self-pay | Admitting: Nurse Practitioner

## 2015-12-30 VITALS — BP 167/72 | HR 58 | Resp 18 | Ht 69.0 in | Wt 277.4 lb

## 2015-12-30 DIAGNOSIS — G4733 Obstructive sleep apnea (adult) (pediatric): Secondary | ICD-10-CM | POA: Diagnosis not present

## 2015-12-30 DIAGNOSIS — F0390 Unspecified dementia without behavioral disturbance: Secondary | ICD-10-CM | POA: Insufficient documentation

## 2015-12-30 DIAGNOSIS — Z9989 Dependence on other enabling machines and devices: Secondary | ICD-10-CM

## 2015-12-30 MED ORDER — DONEPEZIL HCL 10 MG PO TABS: 10.0000 mg | ORAL_TABLET | Freq: Every day | ORAL | Status: DC

## 2015-12-30 NOTE — Progress Notes (Addendum)
[facility]  PATIENT: Jenny Glover DOB: [birth year]   REASON FOR VISIT: Dementia without behavioral disturbance, chronic atrial fibrillation obstructive sleep apnea on CPAP HISTORY FROM: Patient and husband  HISTORY OF PRESENT ILLNESS: HISTORYMs. Glover is a 70 year old right-handed woman with an underlying complex medical history of chronic A. fib, hypertension, hyperlipidemia, uncontrolled diabetes with evidence of diabetic neuropathy and most recent hemoglobin A1c of 9.9 on 02/18/2015, heart disease, status post stent placement in December 2013, and obesity, who presents for follow-up consultation of her memory loss and OSA, on CPAP. The patient is accompanied by her husband today. I last saw her on 05/28/2015, at which time she reported doing better on CPAP. Her nocturia had improved. Her memory was stable. Her diabetes numbers had improved. She had not seen her eye doctor in years. She was not exercising. She was not drinking enough water but would drink a lot of milk. She had gained some weight. She was compliant with CPAP therapy. She make an appointment with an ophthalmologist and continue with CPAP regularly. I did not suggest any new medications.  UPDATE SA, 09/30/2015: I reviewed her CPAP compliance data from 08/28/2015 through 09/26/2015 which is a total of 30 days during which time she used her CPAP 29 days with percent used days greater than 4 hours at 83%, indicating very good compliance with an average usage of 7 hours and 2 minutes, residual AHI mildly elevated at 9.6 per hour, leak at times high with the 95th percentile at 30.7 L/m on a pressure of 9 cm with EPR of 3. She reports doing fairly well, compliant with CPAP, memory stable. Her husband reports issues with urinary incontinence at night. She tried a bladder medication with samples from Dr. Forde Dandy for a month, without much improvement, so she stopped. She has to be on Plavix for her stent and on Xarelto  for the A fib, per Dr. Oran Rein. She had her eyes checked. She no longer reads the paper per patient. He feels that her memory has not declined considerably in the last 6 or 7 months. She drives very little, usually to her son's house. Her husband did let her drive  to town recently and thought she did okay. She does not feel motivated to exercise and does not always eat right. Her weight has increased some. UPDATE 12/30/2015 CMMs. Peplinski, 70 year old female returns for follow-up. She was last seen in the office 09/26/2015 by Dr. Rexene Alberts at that time was placed on Aricept 5 mg for her memory loss dementia. She is tolerating the medication without side effects her memory is stable according to the husband and the patient. She continues to use her CPAP therapy at her compliance was just checked in January. She also has medical history of atrial fibrillation hypertension hyperlipidemia diabetes and obesity. Brain MRI from 2016 showed a subacute stroke. She is currently on Plavix. She returns for reevaluation  REVIEW OF SYSTEMS: Full 14 system review of systems performed and notable only for those listed, all others are neg:  Constitutional: neg  Cardiovascular: Leg swelling Ear/Nose/Throat: neg  Skin: neg Eyes: neg Respiratory: neg Gastroitestinal: neg  Hematology/Lymphatic: neg  Endocrine: neg Musculoskeletal:neg Allergy/Immunology: neg Neurological: Memory loss Psychiatric: neg Sleep : Obstructive sleep apnea with CPAP ALLERGIES: No Known Allergies  HOME MEDICATIONS: Outpatient Prescriptions Prior to Visit  Medication Sig Dispense Refill  . amLODipine (NORVASC) 5 MG tablet Take 5 mg by mouth daily.    Marland Kitchen atorvastatin (LIPITOR) 40 MG tablet Take 1  tablet (40 mg total) by mouth daily at 6 PM. 30 tablet 6  . blood glucose meter kit and supplies KIT Dispense based on patient and insurance preference. Use up to four times daily as directed. (FOR ICD-9 250.00, 250.01). 1 each 0  . cholecalciferol  (VITAMIN D) 1000 UNITS tablet Take 2,000 Units by mouth daily.    . clopidogrel (PLAVIX) 75 MG tablet Take 75 mg by mouth daily.    Marland Kitchen donepezil (ARICEPT) 5 MG tablet Take 1 tablet (5 mg total) by mouth at bedtime. 30 tablet 5  . Escitalopram Oxalate (LEXAPRO PO) Take 10 mg by mouth.     . insulin glargine (LANTUS) 100 UNIT/ML injection Inject 0.15 mLs (15 Units total) into the skin at bedtime. (Patient taking differently: Inject 45 Units into the skin daily. Breakfast.) 10 mL 0  . insulin lispro (HUMALOG) 100 UNIT/ML injection Inject 10 Units into the skin 3 (three) times daily before meals.     . metoprolol succinate (TOPROL-XL) 100 MG 24 hr tablet Take 100 mg by mouth daily. Take with or immediately following a meal.    . Rivaroxaban (XARELTO) 20 MG TABS Take 1 tablet (20 mg total) by mouth daily. 30 tablet 0   No facility-administered medications prior to visit.    PAST MEDICAL HISTORY: Past Medical History  Diagnosis Date  . Atrial fibrillation ([place])     s/p DCCV 2005, 2006. Placed on Tikosyn 2006 (negative adenosine cardiolite 03/2005, could not measure EF).  . Hypertension   . Diabetes mellitus without complication ([place])   . Obesity   . High cholesterol   . Hyperlipemia   . History of cardioversion 2005/2006  . Skin cancer of face     treated with radiation  . Mental status change   . Diabetic foot ulcer ([place])   . Neuropathy ([place])   . Anxiety   . Depression     PAST SURGICAL HISTORY: Past Surgical History  Procedure Laterality Date  . Tenotomy    . Left heart catheterization with coronary angiogram N/A 08/23/2012    Procedure: LEFT HEART CATHETERIZATION WITH CORONARY ANGIOGRAM;  Surgeon: Jettie Booze, MD;  Location: [facility] C[facility] LAB;  Service: Cardiovascular;  Laterality: N/A;  . Percutaneous coronary stent intervention (pci-s)  08/23/2012    Procedure: PERCUTANEOUS CORONARY STENT INTERVENTION (PCI-S);  Surgeon: Jettie Booze, MD;  Location: [facility] CATH LAB;  Service:  Cardiovascular;;  . Skin cancer excision      FAMILY HISTORY: Family History  Problem Relation Age of Onset  . Diabetes Father   . Coronary artery disease Father   . Stroke Mother     SOCIAL HISTORY: Social History   Social History  . Marital Status: Married    Spouse Name: N/A  . Number of Children: 3  . Years of Education: College    Occupational History  . retired Marine scientist    Social History Main Topics  . Smoking status: Never Smoker   . Smokeless tobacco: Not on file  . Alcohol Use: No  . Drug Use: No  . Sexual Activity: Not on file   Other Topics Concern  . Not on file   Social History Narrative   Occasionally drinks coffee, tea, soda      PHYSICAL EXAM  Filed Vitals:   12/30/15 1116  BP: 167/72  Pulse: 58  Height: 5' 9" (1.753 m)  Weight: 277 lb 6.4 oz (125.828 kg)   Body mass index is 40.95 kg/(m^2).  Generalized: Well developed, obese  female in no acute distress, well-groomed  Head: normocephalic and atraumatic,. Oropharynx benign  Neck: Supple, no carotid bruits  Cardiac: Irregularly irregular Musculoskeletal: No deformity   Neurological examination   Mentation: Alert MMSE 21/30 missing items in orientation calculation 3 of 3 recall AFT 6. Clock drawing 4/4.   Follows all commands speech and language fluent.   Cranial nerve II-XII: Pupils were equal round reactive to light extraocular movements were full, visual field were full on confrontational test. Facial sensation and strength were normal. hearing was intact to finger rubbing bilaterally. Uvula tongue midline. head turning and shoulder shrug were normal and symmetric.Tongue protrusion into cheek strength was normal. Motor: normal bulk and tone, full strength in the BUE, BLE, fine finger movements normal, no pronator drift.  Sensory: normal and symmetric to light touch,  in the upper extremities, decreased sensation to touch in the lower extremities Coordination: finger-nose-finger,  heel-to-shin bilaterally, no dysmetria Reflexes: Brachioradialis 2/2, biceps 2/2, triceps 2/2, patellar 2/2, Achilles 2/2, plantar responses were flexor bilaterally. Gait and Station: Rising up from seated position without assistance, cautious gait, unsteady with tandem .No assistive device  DIAGNOSTIC DATA (LABS, IMAGING, TESTING) -  ASSESSMENT AND PLAN  70 y.o. year old female  has a past medical history of Atrial fibrillation ([place]); Hypertension; Diabetes mellitus without complication ([place]); Obesity; High cholesterol; Hyperlipemia; obstructive sleep apnea with CPAP and memory loss  Neuropathy ([place]); Anxiety; and Depression. here to follow-up. She was placed on Aricept 5 mg at her last visit and has tolerated the medication without side effects. She has been compliant with her CPAP. She is on Plavix because of stent cardiac and Xarelto because of atrial fibrillation  Increase Aricept to 64m daily use up the current 566mtabs taking 2 daily Memory score is stable Continue CPAP for obstructive sleep apnea Follow-up with Dr. AtRexene Albertss planned July 18th  NaDennie BibleGNWest Hills Surgical Center LtdBCArkansas State HospitalAPRN  [facility] [address] CourtSuUplands ParkrLillieNC [id number] [phone] reviewed the above note and documentation by the Nurse Practitioner and agree with the history, physical exam, assessment and plan as outlined above. I was immediately available for face-to-face consultation. SaStar AgeMD, PhD [facility] (GLegacy Surgery Center

## 2015-12-30 NOTE — Patient Instructions (Signed)
Increase Aricept to 10mg  daily Memory score is stable Continue CPAP for obstructive sleep apnea Follow-up with Dr. Rexene Alberts as planned July 18th

## 2015-12-31 DIAGNOSIS — I2510 Atherosclerotic heart disease of native coronary artery without angina pectoris: Secondary | ICD-10-CM | POA: Diagnosis not present

## 2015-12-31 DIAGNOSIS — I482 Chronic atrial fibrillation: Secondary | ICD-10-CM | POA: Diagnosis not present

## 2015-12-31 DIAGNOSIS — I10 Essential (primary) hypertension: Secondary | ICD-10-CM | POA: Diagnosis not present

## 2015-12-31 DIAGNOSIS — Z7901 Long term (current) use of anticoagulants: Secondary | ICD-10-CM | POA: Diagnosis not present

## 2016-01-01 ENCOUNTER — Ambulatory Visit (INDEPENDENT_AMBULATORY_CARE_PROVIDER_SITE_OTHER): Payer: Medicare Other | Admitting: Podiatry

## 2016-01-01 DIAGNOSIS — E0842 Diabetes mellitus due to underlying condition with diabetic polyneuropathy: Secondary | ICD-10-CM | POA: Diagnosis not present

## 2016-01-01 DIAGNOSIS — E1040 Type 1 diabetes mellitus with diabetic neuropathy, unspecified: Secondary | ICD-10-CM | POA: Diagnosis not present

## 2016-01-01 DIAGNOSIS — M2040 Other hammer toe(s) (acquired), unspecified foot: Secondary | ICD-10-CM

## 2016-01-01 NOTE — Progress Notes (Signed)
Patient ID: Jenny Glover, female   DOB: July 03, 1946, 70 y.o.   MRN: [id number] Patient presents for diabetic shoe pick up, shoes are tried on for good fit.  Patient received 1 Pair Orthofeet [address] [place] grey in women's size 11 extra wide and 3 pairs custom molded diabetic inserts.  Verbal and written break in and wear instructions given.  Patient will follow up for scheduled routine care.

## 2016-01-01 NOTE — Patient Instructions (Signed)

## 2016-03-31 ENCOUNTER — Encounter: Payer: Self-pay | Admitting: Neurology

## 2016-03-31 ENCOUNTER — Ambulatory Visit (INDEPENDENT_AMBULATORY_CARE_PROVIDER_SITE_OTHER): Payer: Medicare Other | Admitting: Neurology

## 2016-03-31 VITALS — BP 158/82 | HR 76 | Resp 18 | Ht 69.0 in | Wt 282.0 lb

## 2016-03-31 DIAGNOSIS — I482 Chronic atrial fibrillation, unspecified: Secondary | ICD-10-CM

## 2016-03-31 DIAGNOSIS — R635 Abnormal weight gain: Secondary | ICD-10-CM

## 2016-03-31 DIAGNOSIS — F0390 Unspecified dementia without behavioral disturbance: Secondary | ICD-10-CM | POA: Diagnosis not present

## 2016-03-31 DIAGNOSIS — R600 Localized edema: Secondary | ICD-10-CM

## 2016-03-31 DIAGNOSIS — G4733 Obstructive sleep apnea (adult) (pediatric): Secondary | ICD-10-CM | POA: Diagnosis not present

## 2016-03-31 DIAGNOSIS — Z9989 Dependence on other enabling machines and devices: Principal | ICD-10-CM

## 2016-03-31 MED ORDER — DONEPEZIL HCL 10 MG PO TABS: 10.0000 mg | ORAL_TABLET | Freq: Every day | ORAL | Status: DC

## 2016-03-31 NOTE — Progress Notes (Signed)
Subjective:    Patient ID: Jenny Glover is a 70 y.o. female.  HPI     Interim history:   Jenny Glover is a 70 year old right-handed woman with an underlying complex medical history of chronic A. fib, hypertension, hyperlipidemia, uncontrolled diabetes with evidence of diabetic neuropathy and most recent hemoglobin A1c of 9.9 on 02/18/2015, heart disease, status post stent placement in December 2013, and obesity, who presents for follow-up consultation of her memory loss and OSA, on CPAP therapy. The patient is accompanied by her husband today, as well as her daughter. I last saw her on 09/30/2015, at which time she reported doing fairly well, was compliant with CPAP, memory stable. Her husband reported that she had more issues with urinary incontinence at night. She tried a bladder medication with samples from Dr. Forde Dandy for a month, without much improvement, so she stopped it. She has to be on Plavix for her stent and on Xarelto for the A fib, per Dr. Oran Rein. She had her eyes checked. He felt that her memory had not declined considerably in the last 6 or 7 months. She was driving very little, usually to her son's house. She had some weight gain. She had not been exercising regularly and reported not being very motivated to exercise or always eat right. Her MMSE was 22/30, CDT: 4/4, AFT: 12/min at the time. I suggested we start her on low-dose Aricept 5 mg once daily. She saw Cecille Rubin in the interim on 12/30/2015. Her Aricept was increased to 10 mg at the time.  Today, 03/31/2016: I reviewed her CPAP compliance data from 02/29/2016 through 03/29/2016 which is a total of 30 days, during which time she used her machine 29 days with percent used days greater than 4 hours at 87%, indicating very good compliance with an average usage for all nights of 7 hours and 15 minutes, residual AHI suboptimal at 8.4 per hour, leak high with the 95th percentile at 83.3 L/m on a pressure of 9 cm with EPR of  3.  Today, 03/31/2016: She reports doing well, but husband reports most of the Hx. Daughter supplements the history as well. Both her husband and daughter have concerns regarding her lack of physical activity, lack of motivation, eating habits. Patient had an increase in her Lantus insulin. Her most recent A1c per husband was 8.1. She has an appointment with Dr. Forde Dandy next month. She likes to drink tea, usually half-and-half, about 24 ounces per day, drinks 1 cup of coffee in the morning per husband. She is compliant with CPAP. She has noted some air leaking from the mask. Of note, it does not look like she has received any supplies in over 6 months, perhaps even a year. She has gained weight.   Previously:  I saw her on 05/28/2015, at which time she reported doing better on CPAP. Her nocturia had improved. Her memory was stable. Her diabetes numbers had improved. She had not seen her eye doctor in years. She was not exercising. She was not drinking enough water but would drink a lot of milk. She had gained some weight. She was compliant with CPAP therapy. She make an appointment with an ophthalmologist and continue with CPAP regularly. I did not suggest any new medications.  I reviewed her CPAP compliance data from 08/28/2015 through 09/26/2015 which is a total of 30 days during which time she used her CPAP 29 days with percent used days greater than 4 hours at 83%, indicating very good compliance with  an average usage of 7 hours and 2 minutes, residual AHI mildly elevated at 9.6 per hour, leak at times high with the 95th percentile at 30.7 L/m on a pressure of 9 cm with EPR of 3.  I first met her on 03/14/2015 at the request of her primary care physician, at which time she reported a several month history of memory loss. She had had blood work and a brain MRI through her primary care physician. I suggested we monitor her symptoms because she had some metabolic derangements and had stopped taking her  medications in the recent past. We talked about the importance of medical compliance. We talked about risk factors for stroke. 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 Tumor not considered likely in this case given the location and appearance which is typical for infarction.   Atrophy and chronic microvascular ischemia elsewhere as described above.  Her MMSE at the time was 22 out of 30, clock drawing was 4 out of 4 and animal fluency was 13/m. We talked about her vascular risk factors. Her exam was in keeping with diabetic neuropathy. I suggested we proceed with a sleep study. She had a split-night sleep study on 04/03/2015 and underwent over her test results with her in detail today. Sleep efficiency at baseline was 59% with a prolonged sleep latency of 59.5 minutes and wake after sleep onset of 8.5 minutes with mild sleep fragmentation noted. She had an increased percentage of slow-wave sleep and absence of REM sleep prior to CPAP initiation. She had A. fib on EKG. Moderate PLMS were noted with no significant arousals. She had moderate to loud snoring. Total AHI was 47.9 per hour, average oxygen saturation 91%, nadir was 83%. She was then titrated and CPAP therapy. Sleep efficiency was 74.2% with a sleep latency of 8.5 minutes and wake after sleep onset of 66.5 minutes. She had slow-wave sleep at 34.6% and REM sleep at 13%. Average oxygen saturation was improved at 93%, nadir was 90%. Severe PLMS were noted with minimal arousals. CPAP was titrated from 5 cm to 8 cm. Her AHI was 1.1 on the final pressure with supine REM sleep achieved. Based on her test results are prescribed CPAP therapy for home use.  I reviewed her CPAP compliance data from 04/27/2015 through 05/26/2015 which is a total of 30 days during which time she used her machine 30 days with percent used days greater than 4 hours at 93%,  indicating excellent compliance with an average usage of 7 hours and 49 minutes, residual AHI elevated at 14.5 per hour, leak at times high with the 95th percentile at 41.4 L/m on a pressure of 8 cm with EPR of 3. Residual AHI details are not available as in obstructive versus central apneas.  03/14/2015: She has had memory loss in the past few months. I reviewed your office note from 02/28/2015. At that time she had an MMSE of 23 out of 30, clock drawing was 3 out of 4, animal fluency was 12/m. She has had recent blood work through your office including TSH, free T4, vitamin D, vitamin B12, RPR, but results are not available for my review. You also ordered a brain MRI with and without contrast, which she has not yet been scheduled for. She presented to the emergency room on 02/15/2015 because of confusion. Her family reported a several week history of confusion she had stopped taking all her medications. Of  note, she has had a prior hospitalization from 08/21/2012 through 08/24/2012 at which time she had also reportedly stopped taking all her medications. She had a stent placement at the time. I reviewed her hospital records from her December 2013 admission as well as the emergency room records from 02/15/2015. Her blood sugar values were above 600, deemed secondary to noncompliance. ABG was normal. She was treated with 2 L of IV fluids, as well as insulin, and discharged to home. She reportedly stopped taking her medications perhaps weeks or months ago. She is not clear about why and when she stopped taking her medications. She has had symptoms of depression for the past few months. She was recently started on Lexapro. She seems to tolerated well. She recently had a head CT which per family showed an old stroke. The CT results are not available for my review today. Her husband reports that he was told to start her on over-the-counter vitamin D as her vitamin D level was low. Of note, she snores, she also makes  abnormal sounds in her sleep and has been noted to have pauses in her breathing. She has significant lower extremity swelling. She also has been referred to a counselor.  03/15/2015: I reviewed blood test results from 02/18/2015: Vitamin B12 750, RPR nonreactive, TSH 0.73, free T4 1 0.2, vitamin D low at 25, total cholesterol 297, triglycerides 233, LDL 202, blood sugar level 346, BUN 22, creatinine 1.2, sodium 134, otherwise BMP unremarkable, CBC with differential unremarkable, hemoglobin A1c 9.9. She is scheduled for a brain MRI on 03/27/2015 as far as I can tell.  Her Past Medical History Is Significant For: Past Medical History  Diagnosis Date  . Atrial fibrillation ([place])     s/p DCCV 2005, 2006. Placed on Tikosyn 2006 (negative adenosine cardiolite 03/2005, could not measure EF).  . Hypertension   . Diabetes mellitus without complication ([place])   . Obesity   . High cholesterol   . Hyperlipemia   . History of cardioversion 2005/2006  . Skin cancer of face     treated with radiation  . Mental status change   . Diabetic foot ulcer ([place])   . Neuropathy ([place])   . Anxiety   . Depression     Her Past Surgical History Is Significant For: Past Surgical History  Procedure Laterality Date  . Tenotomy    . Left heart catheterization with coronary angiogram N/A 08/23/2012    Procedure: LEFT HEART CATHETERIZATION WITH CORONARY ANGIOGRAM;  Surgeon: Jettie Booze, MD;  Location: [facility] C[facility] LAB;  Service: Cardiovascular;  Laterality: N/A;  . Percutaneous coronary stent intervention (pci-s)  08/23/2012    Procedure: PERCUTANEOUS CORONARY STENT INTERVENTION (PCI-S);  Surgeon: Jettie Booze, MD;  Location: [facility] CATH LAB;  Service: Cardiovascular;;  . Skin cancer excision      Her Family History Is Significant For: Family History  Problem Relation Age of Onset  . Diabetes Father   . Coronary artery disease Father   . Stroke Mother     Her Social History Is Significant For: Social  History   Social History  . Marital Status: Married    Spouse Name: N/A  . Number of Children: 3  . Years of Education: College    Occupational History  . retired Marine scientist    Social History Main Topics  . Smoking status: Never Smoker   . Smokeless tobacco: None  . Alcohol Use: No  . Drug Use: No  . Sexual Activity: Not Asked  Other Topics Concern  . None   Social History Narrative   Occasionally drinks coffee, tea, soda     Her Allergies Are:  No Known Allergies:   Her Current Medications Are:  Outpatient Encounter Prescriptions as of 03/31/2016  Medication Sig  . amLODipine (NORVASC) 5 MG tablet Take 5 mg by mouth daily.  Marland Kitchen atorvastatin (LIPITOR) 40 MG tablet Take 1 tablet (40 mg total) by mouth daily at 6 PM.  . blood glucose meter kit and supplies KIT Dispense based on patient and insurance preference. Use up to four times daily as directed. (FOR ICD-9 250.00, 250.01).  . cholecalciferol (VITAMIN D) 1000 UNITS tablet Take 2,000 Units by mouth daily.  . clopidogrel (PLAVIX) 75 MG tablet Take 75 mg by mouth daily.  Marland Kitchen donepezil (ARICEPT) 10 MG tablet Take 1 tablet (10 mg total) by mouth daily.  . Escitalopram Oxalate (LEXAPRO PO) Take 10 mg by mouth.   . insulin glargine (LANTUS) 100 UNIT/ML injection Inject 0.15 mLs (15 Units total) into the skin at bedtime. (Patient taking differently: Inject 45 Units into the skin daily. Breakfast.)  . insulin lispro (HUMALOG) 100 UNIT/ML injection Inject 10 Units into the skin 3 (three) times daily before meals.   . metoprolol succinate (TOPROL-XL) 100 MG 24 hr tablet Take 100 mg by mouth daily. Take with or immediately following a meal.  . Rivaroxaban (XARELTO) 20 MG TABS Take 1 tablet (20 mg total) by mouth daily.   No facility-administered encounter medications on file as of 03/31/2016.  :  Review of Systems:  Out of a complete 14 point review of systems, all are reviewed and negative with the exception of these symptoms as listed  below:  Review of Systems  Neurological:       Patient states that she is doing well with CPAP, no new concerns.    Objective:  Neurologic Exam  Physical Exam Physical Examination:   Filed Vitals:   03/31/16 1139  BP: 158/82  Pulse: 76  Resp: 18   General Examination: The patient is a very pleasant 70 y.o. female in no acute distress. She is obese and adequately dressed. She has had some weight gain.   HEENT: Normocephalic, atraumatic, pupils are equal, round and reactive to light and accommodation. Extraocular tracking is good without limitation to gaze excursion or nystagmus noted. Normal smooth pursuit is noted. Hearing is grossly intact. Face is symmetric with normal facial animation and normal facial sensation. Speech is clear with no dysarthria noted. There is no hypophonia. There is no lip, neck/head, jaw or voice tremor. Neck is supple with full range of passive and active motion. There are no carotid bruits on auscultation. Oropharynx exam reveals: moderate mouth dryness, adequate dental hygiene and moderate airway crowding, due to tonsils in place, redundant soft palate and larger tongue as well as narrow airway entry. Mallampati is class II. Tongue protrudes centrally and palate elevates symmetrically.   Chest: Clear to auscultation without wheezing, rhonchi or crackles noted.  Heart: S1+S2+0, irregularly irregular with a systolic murmur noted, unchanged.   Abdomen: Soft, non-tender and non-distended with normal bowel sounds appreciated on auscultation.  Extremities: There is 2+ pitting edema in the distal lower extremities bilaterally. Pedal pulses are intact.  Skin: Warm and dry without trophic changes noted. She had skin cancer removed from her left face.   Musculoskeletal: exam reveals no obvious joint deformities, tenderness or joint swelling or erythema.   Neurologically:  Mental status: The patient is awake, alert and oriented  in all 4 spheres. Her immediate and  remote memory, attention, language skills and fund of knowledge are  mildly abnormal. She appears less depressed.   On 03/14/2015: Her MMSE is 22 out of 30, clock drawing is 4 out of 4, animal fluency is 13/m.   On 09/30/2015: Her MMSE: 22/30, CDT: 4/4, AFT: 12/min.   Cranial nerves II - XII are as described above under HEENT exam. In addition: shoulder shrug is normal with equal shoulder height noted. Motor exam: Normal bulk, strength and tone is noted. There is no drift, tremor or rebound. Romberg is negative. Reflexes are 1+ throughout. Fine motor skills and coordination: intact with normal finger taps, normal hand movements, normal rapid alternating patting, normal foot taps and normal foot agility.  Cerebellar testing: No dysmetria or intention tremor on finger to nose testing. Sensory exam: intact to light touch in the upper extremities with decreased sensation to all modalities in the distal lower extremities bilaterally, probably up to below knees bilaterally.  Gait, station and balance: She stands with difficulty and has to push herself up. No veering to one side is noted. No leaning to one side is noted. Posture is age-appropriate and stance is slightly wide-based. She walks slowly, tandem walk is not possible for her.    Assessment and Plan:    In summary, Jenny Glover is a very pleasant 70 year old female with an underlying complex medical history of chronic A. fib, hypertension, hyperlipidemia, difficult to control diabetes with evidence of diabetic neuropathy, heart disease, status post stent placement in December 2013, and morbid obesity, as well as a recent diagnosis of severe obstructive sleep apnea, established on CPAP therapy, who presents for follow-up consultation for her memory loss and obstructive sleep apnea. She is compliant with CPAP therapy and commended for this.  however, her residual AHI is suboptimal at 8.4 per hour, leak is very high, she needs new supplies. In addition,  I suggested we increase her pressure to 10 cm, this could be residual sleep apnea or recurrence of sleep apnea issues because of recent weight gain. Of note, her brain MRI from 2016 had shown a subacute stroke and we talked about the test results and our recent visits. She does have multiple vascular risk factors. We talked about weight loss and the importance of exercise. She is encouraged to drink more water as she does not drink enough water.  she is able to tolerate Aricept 10 mg strength with the exception of occasional GI side effects including diarrhea. She has skipped very few tablets because of diarrhea according to her husband but generally is able to tolerate it and is compliant with her CPAP in her medication. She has had worsening lower extremity edema. She is not very motivated to do anything physically. She has all the tools she needs to improve her overall health and her lifestyle but does lack some motivation for this. She is advised to follow through with all the recommendations. I suggested we continue with Aricept 10 mg once daily and I placed an order for CPAP supplies and a mask refit as well as increasing her pressure to 10 cm at this time. We will see her back in 6 months, sooner as needed. I answered all their questions today and the patient and her family were in agreement. I spent 25 minutes in total face-to-face time with the patient, more than 50% of which was spent in counseling and coordination of care, reviewing test results, reviewing medication and discussing  or reviewing the diagnosis of OSA and memory loss, stroke, the prognosis and treatment options.

## 2016-03-31 NOTE — Patient Instructions (Addendum)
I think overall you are doing fairly well but I do want to suggest a few things today:  Remember to drink plenty of fluid, eat healthy meals and do not skip any meals. Try to eat protein with a every meal and eat a healthy snack such as fruit or nuts in between meals. Try to keep a regular sleep-wake schedule and try to exercise daily, particularly in the form of walking, 20-30 minutes a day, if you can. Good nutrition, proper sleep and exercise can help your memory and cognitive function.  Please don't drink ANY sugary drinks. Limit your caffeine intake.  Engage in social activities in your community and with your family and try to keep up with current events by reading the newspaper or watching the news. If you have computer and can go online, try [email or web address]. Also, you may like to do word finding puzzles or crossword puzzles.  As far as your medications are concerned, I would like to suggest you continue with Aricept 10 mg generic once daily in the evening.     We will see you back in 6 months, sooner if we need to. You can see Cecille Rubin, NP, at the time. Please call us with any interim questions, concerns, problems, updates or refill requests.  Beverlee Nims is my nurse and will answer any of your questions and relay your messages to me and also relay my messages to you.  Our phone number is [phone]. We also have an after hours call service for urgent matters and there is a physician on-call for urgent questions. For any emergencies you know to call 911 or go to the nearest emergency room.   Please continue using your CPAP regularly. While your insurance requires that you use CPAP at least 4 hours each night on 70% of the nights, I recommend, that you not skip any nights and use it throughout the night if you can. Getting used to CPAP and staying with the treatment long term does take time and patience and discipline. Untreated obstructive sleep apnea when it is moderate to severe can have an  adverse impact on cardiovascular health and raise her risk for heart disease, arrhythmias, hypertension, congestive heart failure, stroke and diabetes. Untreated obstructive sleep apnea causes sleep disruption, nonrestorative sleep, and sleep deprivation. This can have an impact on your day to day functioning and cause daytime sleepiness and impairment of cognitive function, memory loss, mood disturbance, and problems focussing. Using CPAP regularly can improve these symptoms.

## 2016-04-08 DIAGNOSIS — L570 Actinic keratosis: Secondary | ICD-10-CM | POA: Diagnosis not present

## 2016-04-08 DIAGNOSIS — C44319 Basal cell carcinoma of skin of other parts of face: Secondary | ICD-10-CM | POA: Diagnosis not present

## 2016-04-08 DIAGNOSIS — L2081 Atopic neurodermatitis: Secondary | ICD-10-CM | POA: Diagnosis not present

## 2016-04-08 DIAGNOSIS — Z85828 Personal history of other malignant neoplasm of skin: Secondary | ICD-10-CM | POA: Diagnosis not present

## 2016-04-08 DIAGNOSIS — D485 Neoplasm of uncertain behavior of skin: Secondary | ICD-10-CM | POA: Diagnosis not present

## 2016-04-08 DIAGNOSIS — L821 Other seborrheic keratosis: Secondary | ICD-10-CM | POA: Diagnosis not present

## 2016-04-08 DIAGNOSIS — B078 Other viral warts: Secondary | ICD-10-CM | POA: Diagnosis not present

## 2016-04-08 DIAGNOSIS — D1801 Hemangioma of skin and subcutaneous tissue: Secondary | ICD-10-CM | POA: Diagnosis not present

## 2016-04-08 DIAGNOSIS — L814 Other melanin hyperpigmentation: Secondary | ICD-10-CM | POA: Diagnosis not present

## 2016-06-17 DIAGNOSIS — E1129 Type 2 diabetes mellitus with other diabetic kidney complication: Secondary | ICD-10-CM | POA: Diagnosis not present

## 2016-06-17 DIAGNOSIS — F0150 Vascular dementia without behavioral disturbance: Secondary | ICD-10-CM | POA: Diagnosis not present

## 2016-06-17 DIAGNOSIS — I10 Essential (primary) hypertension: Secondary | ICD-10-CM | POA: Diagnosis not present

## 2016-06-17 DIAGNOSIS — I2510 Atherosclerotic heart disease of native coronary artery without angina pectoris: Secondary | ICD-10-CM | POA: Diagnosis not present

## 2016-06-17 DIAGNOSIS — G4733 Obstructive sleep apnea (adult) (pediatric): Secondary | ICD-10-CM | POA: Diagnosis not present

## 2016-06-17 DIAGNOSIS — E784 Other hyperlipidemia: Secondary | ICD-10-CM | POA: Diagnosis not present

## 2016-06-17 DIAGNOSIS — E871 Hypo-osmolality and hyponatremia: Secondary | ICD-10-CM | POA: Diagnosis not present

## 2016-06-17 DIAGNOSIS — I480 Paroxysmal atrial fibrillation: Secondary | ICD-10-CM | POA: Diagnosis not present

## 2016-06-17 DIAGNOSIS — N183 Chronic kidney disease, stage 3 (moderate): Secondary | ICD-10-CM | POA: Diagnosis not present

## 2016-06-17 DIAGNOSIS — D126 Benign neoplasm of colon, unspecified: Secondary | ICD-10-CM | POA: Diagnosis not present

## 2016-06-17 DIAGNOSIS — Z23 Encounter for immunization: Secondary | ICD-10-CM | POA: Diagnosis not present

## 2016-06-17 DIAGNOSIS — Z6841 Body Mass Index (BMI) 40.0 and over, adult: Secondary | ICD-10-CM | POA: Diagnosis not present

## 2016-06-23 ENCOUNTER — Other Ambulatory Visit (HOSPITAL_COMMUNITY): Payer: Self-pay | Admitting: Diagnostic Radiology

## 2016-06-23 DIAGNOSIS — Z1231 Encounter for screening mammogram for malignant neoplasm of breast: Secondary | ICD-10-CM

## 2016-06-29 DIAGNOSIS — C44319 Basal cell carcinoma of skin of other parts of face: Secondary | ICD-10-CM | POA: Diagnosis not present

## 2016-06-29 DIAGNOSIS — L905 Scar conditions and fibrosis of skin: Secondary | ICD-10-CM | POA: Diagnosis not present

## 2016-06-29 DIAGNOSIS — D485 Neoplasm of uncertain behavior of skin: Secondary | ICD-10-CM | POA: Diagnosis not present

## 2016-07-07 DIAGNOSIS — I482 Chronic atrial fibrillation: Secondary | ICD-10-CM | POA: Diagnosis not present

## 2016-07-07 DIAGNOSIS — E785 Hyperlipidemia, unspecified: Secondary | ICD-10-CM | POA: Diagnosis not present

## 2016-07-07 DIAGNOSIS — I2510 Atherosclerotic heart disease of native coronary artery without angina pectoris: Secondary | ICD-10-CM | POA: Diagnosis not present

## 2016-07-07 DIAGNOSIS — Z7901 Long term (current) use of anticoagulants: Secondary | ICD-10-CM | POA: Diagnosis not present

## 2016-09-01 ENCOUNTER — Other Ambulatory Visit: Payer: Self-pay | Admitting: Endocrinology

## 2016-09-01 DIAGNOSIS — Z1231 Encounter for screening mammogram for malignant neoplasm of breast: Secondary | ICD-10-CM

## 2016-09-02 ENCOUNTER — Ambulatory Visit
Admission: RE | Admit: 2016-09-02 | Discharge: 2016-09-02 | Disposition: A | Discharge status: Home / Self Care | Payer: Medicare Other | Source: Ambulatory Visit | Attending: Endocrinology | Admitting: Endocrinology

## 2016-09-02 DIAGNOSIS — Z1231 Encounter for screening mammogram for malignant neoplasm of breast: Secondary | ICD-10-CM

## 2016-09-21 DIAGNOSIS — C44319 Basal cell carcinoma of skin of other parts of face: Secondary | ICD-10-CM | POA: Diagnosis not present

## 2016-10-01 ENCOUNTER — Ambulatory Visit: Payer: Medicare Other | Admitting: Neurology

## 2016-10-02 ENCOUNTER — Telehealth: Payer: Self-pay

## 2016-10-02 NOTE — Telephone Encounter (Signed)
I called the patient, from home on Wednesday (1/17), and advised the pt that the office is closed and we will call back to reschedule (due to the snow). Patient voiced understanding but stated that she did not want to reschedule at this time. She reports that she does not want to r/s her appt and gave no reason why. I voiced understanding and advised her to call us back if she changes her mind.

## 2016-10-08 DIAGNOSIS — Z85828 Personal history of other malignant neoplasm of skin: Secondary | ICD-10-CM | POA: Diagnosis not present

## 2016-10-08 DIAGNOSIS — L814 Other melanin hyperpigmentation: Secondary | ICD-10-CM | POA: Diagnosis not present

## 2016-10-08 DIAGNOSIS — L821 Other seborrheic keratosis: Secondary | ICD-10-CM | POA: Diagnosis not present

## 2016-10-08 DIAGNOSIS — D1801 Hemangioma of skin and subcutaneous tissue: Secondary | ICD-10-CM | POA: Diagnosis not present

## 2016-10-08 DIAGNOSIS — L570 Actinic keratosis: Secondary | ICD-10-CM | POA: Diagnosis not present

## 2016-12-16 DIAGNOSIS — D126 Benign neoplasm of colon, unspecified: Secondary | ICD-10-CM | POA: Diagnosis not present

## 2016-12-16 DIAGNOSIS — Z1389 Encounter for screening for other disorder: Secondary | ICD-10-CM | POA: Diagnosis not present

## 2016-12-16 DIAGNOSIS — N08 Glomerular disorders in diseases classified elsewhere: Secondary | ICD-10-CM | POA: Diagnosis not present

## 2016-12-16 DIAGNOSIS — E784 Other hyperlipidemia: Secondary | ICD-10-CM | POA: Diagnosis not present

## 2016-12-16 DIAGNOSIS — I480 Paroxysmal atrial fibrillation: Secondary | ICD-10-CM | POA: Diagnosis not present

## 2016-12-16 DIAGNOSIS — I2510 Atherosclerotic heart disease of native coronary artery without angina pectoris: Secondary | ICD-10-CM | POA: Diagnosis not present

## 2016-12-16 DIAGNOSIS — Z6841 Body Mass Index (BMI) 40.0 and over, adult: Secondary | ICD-10-CM | POA: Diagnosis not present

## 2016-12-16 DIAGNOSIS — E1142 Type 2 diabetes mellitus with diabetic polyneuropathy: Secondary | ICD-10-CM | POA: Diagnosis not present

## 2016-12-16 DIAGNOSIS — E1140 Type 2 diabetes mellitus with diabetic neuropathy, unspecified: Secondary | ICD-10-CM | POA: Diagnosis not present

## 2016-12-16 DIAGNOSIS — F0150 Vascular dementia without behavioral disturbance: Secondary | ICD-10-CM | POA: Diagnosis not present

## 2016-12-16 DIAGNOSIS — G4733 Obstructive sleep apnea (adult) (pediatric): Secondary | ICD-10-CM | POA: Diagnosis not present

## 2016-12-21 DIAGNOSIS — M170 Bilateral primary osteoarthritis of knee: Secondary | ICD-10-CM | POA: Diagnosis not present

## 2016-12-21 DIAGNOSIS — M25561 Pain in right knee: Secondary | ICD-10-CM | POA: Diagnosis not present

## 2016-12-21 DIAGNOSIS — G8929 Other chronic pain: Secondary | ICD-10-CM | POA: Diagnosis not present

## 2017-01-28 DIAGNOSIS — M25561 Pain in right knee: Secondary | ICD-10-CM | POA: Diagnosis not present

## 2017-01-28 DIAGNOSIS — M1711 Unilateral primary osteoarthritis, right knee: Secondary | ICD-10-CM | POA: Diagnosis not present

## 2017-01-28 DIAGNOSIS — G8929 Other chronic pain: Secondary | ICD-10-CM | POA: Diagnosis not present

## 2017-02-05 DIAGNOSIS — M25561 Pain in right knee: Secondary | ICD-10-CM | POA: Diagnosis not present

## 2017-02-05 DIAGNOSIS — M170 Bilateral primary osteoarthritis of knee: Secondary | ICD-10-CM | POA: Diagnosis not present

## 2017-02-05 DIAGNOSIS — G8929 Other chronic pain: Secondary | ICD-10-CM | POA: Diagnosis not present

## 2017-02-11 DIAGNOSIS — M170 Bilateral primary osteoarthritis of knee: Secondary | ICD-10-CM | POA: Diagnosis not present

## 2017-03-26 DIAGNOSIS — M25561 Pain in right knee: Secondary | ICD-10-CM | POA: Diagnosis not present

## 2017-03-26 DIAGNOSIS — G8929 Other chronic pain: Secondary | ICD-10-CM | POA: Diagnosis not present

## 2017-04-07 DIAGNOSIS — D1801 Hemangioma of skin and subcutaneous tissue: Secondary | ICD-10-CM | POA: Diagnosis not present

## 2017-04-07 DIAGNOSIS — Z85828 Personal history of other malignant neoplasm of skin: Secondary | ICD-10-CM | POA: Diagnosis not present

## 2017-04-07 DIAGNOSIS — L821 Other seborrheic keratosis: Secondary | ICD-10-CM | POA: Diagnosis not present

## 2017-04-07 DIAGNOSIS — D485 Neoplasm of uncertain behavior of skin: Secondary | ICD-10-CM | POA: Diagnosis not present

## 2017-04-07 DIAGNOSIS — B079 Viral wart, unspecified: Secondary | ICD-10-CM | POA: Diagnosis not present

## 2017-04-07 DIAGNOSIS — L814 Other melanin hyperpigmentation: Secondary | ICD-10-CM | POA: Diagnosis not present

## 2017-04-07 DIAGNOSIS — L570 Actinic keratosis: Secondary | ICD-10-CM | POA: Diagnosis not present

## 2017-04-26 DIAGNOSIS — Z6841 Body Mass Index (BMI) 40.0 and over, adult: Secondary | ICD-10-CM | POA: Diagnosis not present

## 2017-04-26 DIAGNOSIS — I10 Essential (primary) hypertension: Secondary | ICD-10-CM | POA: Diagnosis not present

## 2017-04-26 DIAGNOSIS — F0150 Vascular dementia without behavioral disturbance: Secondary | ICD-10-CM | POA: Diagnosis not present

## 2017-04-26 DIAGNOSIS — E1140 Type 2 diabetes mellitus with diabetic neuropathy, unspecified: Secondary | ICD-10-CM | POA: Diagnosis not present

## 2017-04-26 DIAGNOSIS — N183 Chronic kidney disease, stage 3 (moderate): Secondary | ICD-10-CM | POA: Diagnosis not present

## 2017-04-26 DIAGNOSIS — I480 Paroxysmal atrial fibrillation: Secondary | ICD-10-CM | POA: Diagnosis not present

## 2017-04-26 DIAGNOSIS — I2510 Atherosclerotic heart disease of native coronary artery without angina pectoris: Secondary | ICD-10-CM | POA: Diagnosis not present

## 2017-04-26 DIAGNOSIS — E784 Other hyperlipidemia: Secondary | ICD-10-CM | POA: Diagnosis not present

## 2017-04-26 DIAGNOSIS — I638 Other cerebral infarction: Secondary | ICD-10-CM | POA: Diagnosis not present

## 2017-04-26 DIAGNOSIS — E11621 Type 2 diabetes mellitus with foot ulcer: Secondary | ICD-10-CM | POA: Diagnosis not present

## 2017-05-20 DIAGNOSIS — E1142 Type 2 diabetes mellitus with diabetic polyneuropathy: Secondary | ICD-10-CM | POA: Diagnosis not present

## 2017-05-20 DIAGNOSIS — Z6839 Body mass index (BMI) 39.0-39.9, adult: Secondary | ICD-10-CM | POA: Diagnosis not present

## 2017-05-20 DIAGNOSIS — I480 Paroxysmal atrial fibrillation: Secondary | ICD-10-CM | POA: Diagnosis not present

## 2017-05-20 DIAGNOSIS — E1140 Type 2 diabetes mellitus with diabetic neuropathy, unspecified: Secondary | ICD-10-CM | POA: Diagnosis not present

## 2017-05-20 DIAGNOSIS — E784 Other hyperlipidemia: Secondary | ICD-10-CM | POA: Diagnosis not present

## 2017-05-20 DIAGNOSIS — I10 Essential (primary) hypertension: Secondary | ICD-10-CM | POA: Diagnosis not present

## 2017-05-20 DIAGNOSIS — G4733 Obstructive sleep apnea (adult) (pediatric): Secondary | ICD-10-CM | POA: Diagnosis not present

## 2017-05-20 DIAGNOSIS — Z1389 Encounter for screening for other disorder: Secondary | ICD-10-CM | POA: Diagnosis not present

## 2017-05-20 DIAGNOSIS — I639 Cerebral infarction, unspecified: Secondary | ICD-10-CM | POA: Diagnosis not present

## 2017-05-20 DIAGNOSIS — E559 Vitamin D deficiency, unspecified: Secondary | ICD-10-CM | POA: Diagnosis not present

## 2017-07-02 ENCOUNTER — Encounter: Payer: Self-pay | Admitting: Cardiology

## 2017-07-02 DIAGNOSIS — Z7901 Long term (current) use of anticoagulants: Secondary | ICD-10-CM | POA: Diagnosis not present

## 2017-07-02 DIAGNOSIS — E1142 Type 2 diabetes mellitus with diabetic polyneuropathy: Secondary | ICD-10-CM | POA: Diagnosis not present

## 2017-07-02 DIAGNOSIS — I2510 Atherosclerotic heart disease of native coronary artery without angina pectoris: Secondary | ICD-10-CM | POA: Diagnosis not present

## 2017-07-02 DIAGNOSIS — I482 Chronic atrial fibrillation, unspecified: Secondary | ICD-10-CM

## 2017-07-02 DIAGNOSIS — E7849 Other hyperlipidemia: Secondary | ICD-10-CM | POA: Diagnosis not present

## 2017-07-02 DIAGNOSIS — E669 Obesity, unspecified: Secondary | ICD-10-CM | POA: Diagnosis not present

## 2017-07-02 DIAGNOSIS — I10 Essential (primary) hypertension: Secondary | ICD-10-CM | POA: Diagnosis not present

## 2017-07-02 DIAGNOSIS — I119 Hypertensive heart disease without heart failure: Secondary | ICD-10-CM | POA: Diagnosis not present

## 2017-07-02 DIAGNOSIS — G4733 Obstructive sleep apnea (adult) (pediatric): Secondary | ICD-10-CM | POA: Diagnosis not present

## 2017-07-30 ENCOUNTER — Ambulatory Visit: Payer: Medicare Other

## 2017-07-30 ENCOUNTER — Ambulatory Visit (INDEPENDENT_AMBULATORY_CARE_PROVIDER_SITE_OTHER): Payer: Medicare Other | Admitting: Podiatry

## 2017-07-30 ENCOUNTER — Encounter: Payer: Self-pay | Admitting: Podiatry

## 2017-07-30 DIAGNOSIS — L97401 Non-pressure chronic ulcer of unspecified heel and midfoot limited to breakdown of skin: Secondary | ICD-10-CM

## 2017-07-30 DIAGNOSIS — E1040 Type 1 diabetes mellitus with diabetic neuropathy, unspecified: Secondary | ICD-10-CM | POA: Diagnosis not present

## 2017-07-30 DIAGNOSIS — L84 Corns and callosities: Secondary | ICD-10-CM

## 2017-07-30 DIAGNOSIS — E0842 Diabetes mellitus due to underlying condition with diabetic polyneuropathy: Secondary | ICD-10-CM | POA: Diagnosis not present

## 2017-07-30 MED ORDER — CEPHALEXIN 500 MG PO CAPS: 500.0000 mg | ORAL_CAPSULE | Freq: Three times a day (TID) | ORAL | 1 refills | Status: DC

## 2017-08-03 ENCOUNTER — Telehealth: Payer: Self-pay | Admitting: *Deleted

## 2017-08-03 NOTE — Telephone Encounter (Addendum)
Dr. Paulla Dolly ordered iodosorb for diabetic ulcer 1.5 x 1.5 x 0.1cm with low exudate to left 1st metatarsal, E08.42 for daily dressing changes. Faxed to [facility].

## 2017-08-04 NOTE — Progress Notes (Signed)
Subjective:    Patient ID: Jenny Glover, female   DOB: 71 y.o.   MRN: [id number]   HPI patient presents with irritation of the left lateral foot with long-term history of diabetes with neurological changes. Does not note any redness and she's had no systemic signs of infection    ROS      Objective:  Physical Exam neurovascular status unchanged with keratotic tissue sub-fifth metatarsal left that shows localized irritation localized breakdown but no proximal edema erythema or drainage noted     Assessment:   Localized breakdown of tissue left with crusted-like tissue and superficial ulceration with no proximal issues      Plan:    H&P condition reviewed deep debridement accomplished with no iatrogenic bleeding and applied sterile dressing to the area. Gave instructions on offloading and we will get approval for diabetic shoes due to chronic nature of condition and if any other redness were to occur or other issues she is to let us know immediately

## 2017-08-10 ENCOUNTER — Telehealth: Payer: Self-pay | Admitting: *Deleted

## 2017-08-10 NOTE — Telephone Encounter (Signed)
Pt's husband, Elenore Rota states he is checking if pt's therapeutic foot wear has been approved.

## 2017-08-11 ENCOUNTER — Ambulatory Visit: Payer: Medicare Other | Admitting: Orthotics

## 2017-08-11 DIAGNOSIS — E1151 Type 2 diabetes mellitus with diabetic peripheral angiopathy without gangrene: Secondary | ICD-10-CM

## 2017-08-11 NOTE — Progress Notes (Signed)
Patient came in today for diabetic shoe measurements.

## 2017-08-31 DIAGNOSIS — E1140 Type 2 diabetes mellitus with diabetic neuropathy, unspecified: Secondary | ICD-10-CM | POA: Diagnosis not present

## 2017-08-31 DIAGNOSIS — F3289 Other specified depressive episodes: Secondary | ICD-10-CM | POA: Diagnosis not present

## 2017-08-31 DIAGNOSIS — Z8673 Personal history of transient ischemic attack (TIA), and cerebral infarction without residual deficits: Secondary | ICD-10-CM | POA: Diagnosis not present

## 2017-08-31 DIAGNOSIS — E11621 Type 2 diabetes mellitus with foot ulcer: Secondary | ICD-10-CM | POA: Diagnosis not present

## 2017-08-31 DIAGNOSIS — E871 Hypo-osmolality and hyponatremia: Secondary | ICD-10-CM | POA: Diagnosis not present

## 2017-08-31 DIAGNOSIS — I2510 Atherosclerotic heart disease of native coronary artery without angina pectoris: Secondary | ICD-10-CM | POA: Diagnosis not present

## 2017-08-31 DIAGNOSIS — E669 Obesity, unspecified: Secondary | ICD-10-CM | POA: Diagnosis not present

## 2017-08-31 DIAGNOSIS — F0150 Vascular dementia without behavioral disturbance: Secondary | ICD-10-CM | POA: Diagnosis not present

## 2017-08-31 DIAGNOSIS — G4733 Obstructive sleep apnea (adult) (pediatric): Secondary | ICD-10-CM | POA: Diagnosis not present

## 2017-08-31 DIAGNOSIS — I10 Essential (primary) hypertension: Secondary | ICD-10-CM | POA: Diagnosis not present

## 2017-08-31 DIAGNOSIS — E1142 Type 2 diabetes mellitus with diabetic polyneuropathy: Secondary | ICD-10-CM | POA: Diagnosis not present

## 2017-08-31 DIAGNOSIS — I480 Paroxysmal atrial fibrillation: Secondary | ICD-10-CM | POA: Diagnosis not present

## 2017-09-09 ENCOUNTER — Ambulatory Visit (INDEPENDENT_AMBULATORY_CARE_PROVIDER_SITE_OTHER): Payer: Medicare Other | Admitting: Orthotics

## 2017-09-09 DIAGNOSIS — L97401 Non-pressure chronic ulcer of unspecified heel and midfoot limited to breakdown of skin: Secondary | ICD-10-CM

## 2017-09-09 DIAGNOSIS — M2040 Other hammer toe(s) (acquired), unspecified foot: Secondary | ICD-10-CM

## 2017-09-09 DIAGNOSIS — E0842 Diabetes mellitus due to underlying condition with diabetic polyneuropathy: Secondary | ICD-10-CM

## 2017-09-09 DIAGNOSIS — E1040 Type 1 diabetes mellitus with diabetic neuropathy, unspecified: Secondary | ICD-10-CM

## 2017-09-10 NOTE — Progress Notes (Signed)

## 2017-12-29 DIAGNOSIS — G4733 Obstructive sleep apnea (adult) (pediatric): Secondary | ICD-10-CM | POA: Diagnosis not present

## 2017-12-29 DIAGNOSIS — E7849 Other hyperlipidemia: Secondary | ICD-10-CM | POA: Diagnosis not present

## 2017-12-29 DIAGNOSIS — N183 Chronic kidney disease, stage 3 (moderate): Secondary | ICD-10-CM | POA: Diagnosis not present

## 2017-12-29 DIAGNOSIS — I480 Paroxysmal atrial fibrillation: Secondary | ICD-10-CM | POA: Diagnosis not present

## 2017-12-29 DIAGNOSIS — E1142 Type 2 diabetes mellitus with diabetic polyneuropathy: Secondary | ICD-10-CM | POA: Diagnosis not present

## 2017-12-29 DIAGNOSIS — I639 Cerebral infarction, unspecified: Secondary | ICD-10-CM | POA: Diagnosis not present

## 2017-12-29 DIAGNOSIS — E1140 Type 2 diabetes mellitus with diabetic neuropathy, unspecified: Secondary | ICD-10-CM | POA: Diagnosis not present

## 2017-12-29 DIAGNOSIS — F0390 Unspecified dementia without behavioral disturbance: Secondary | ICD-10-CM | POA: Diagnosis not present

## 2017-12-29 DIAGNOSIS — I2510 Atherosclerotic heart disease of native coronary artery without angina pectoris: Secondary | ICD-10-CM | POA: Diagnosis not present

## 2018-04-20 DIAGNOSIS — Z794 Long term (current) use of insulin: Secondary | ICD-10-CM | POA: Diagnosis not present

## 2018-04-20 DIAGNOSIS — E119 Type 2 diabetes mellitus without complications: Secondary | ICD-10-CM | POA: Diagnosis not present

## 2018-04-20 DIAGNOSIS — H25813 Combined forms of age-related cataract, bilateral: Secondary | ICD-10-CM | POA: Diagnosis not present

## 2018-05-03 DIAGNOSIS — I2510 Atherosclerotic heart disease of native coronary artery without angina pectoris: Secondary | ICD-10-CM | POA: Diagnosis not present

## 2018-05-03 DIAGNOSIS — E1140 Type 2 diabetes mellitus with diabetic neuropathy, unspecified: Secondary | ICD-10-CM | POA: Diagnosis not present

## 2018-05-03 DIAGNOSIS — E1142 Type 2 diabetes mellitus with diabetic polyneuropathy: Secondary | ICD-10-CM | POA: Diagnosis not present

## 2018-05-03 DIAGNOSIS — F0150 Vascular dementia without behavioral disturbance: Secondary | ICD-10-CM | POA: Diagnosis not present

## 2018-05-03 DIAGNOSIS — E559 Vitamin D deficiency, unspecified: Secondary | ICD-10-CM | POA: Diagnosis not present

## 2018-05-03 DIAGNOSIS — N183 Chronic kidney disease, stage 3 (moderate): Secondary | ICD-10-CM | POA: Diagnosis not present

## 2018-05-03 DIAGNOSIS — E7849 Other hyperlipidemia: Secondary | ICD-10-CM | POA: Diagnosis not present

## 2018-05-03 DIAGNOSIS — I480 Paroxysmal atrial fibrillation: Secondary | ICD-10-CM | POA: Diagnosis not present

## 2018-05-03 DIAGNOSIS — I10 Essential (primary) hypertension: Secondary | ICD-10-CM | POA: Diagnosis not present

## 2018-05-03 DIAGNOSIS — M25561 Pain in right knee: Secondary | ICD-10-CM | POA: Diagnosis not present

## 2018-06-29 NOTE — Progress Notes (Signed)
Jenny Glover  Date of visit:  07/02/2017 DOB:  1945-09-17    Age:  72 yrs. Medical record number:  [id number]     Account number:  [id number] Primary Care Provider: [place], [place] A ____________________________ CURRENT DIAGNOSES  1. CAD Native without angina  2. Chronic atrial fibrillation  3. Long term (current) use of anticoagulants  4. Hypertensive heart disease without heart failure  5. Type 2 diabetes mellitus with diabetic polyneuropathy  6. Hyperlipidemia  7. Sleep apnea  8. Obesity ____________________________ ALLERGIES  Ace Inhibitors, Hyperkalemia (high serum potassium)  Moxifloxacin, Rash ____________________________ MEDICATIONS  1. amlodipine 5 mg tablet, 1 p.o. daily  2. atorvastatin 40 mg tablet, 1 p.o. daily  3. clopidogrel 75 mg tablet, 1 p.o. daily  4. escitalopram 10 mg tablet, 1 PO daily  5. Humalog 100 unit/mL subcutaneous solution, 10 units before each meal  6. Lantus U-100 Insulin 100 unit/mL subcutaneous solution, 40 units daily  7. memantine 5 mg tablet, BID  8. metoprolol succinate ER 100 mg tablet,extended release 24 hr, 1 time daily  9. Vitamin D3 1,000 unit capsule, 2 p.o. daily  10. Xarelto 20 mg tablet, 1 p.o. daily ____________________________ HISTORY OF PRESENT ILLNESS Patient returns for cardiac followup. She remains with significant dementia and has a fairly significant story of confabulation. She told me that she just quit working about one month ago. She is having hallucinations where she sees a daughter died several years ago as well as her parents who are all deceased. She has lost about 7 pounds since she was here. According to her husband she has some edema but it is better today than has been. Has not used nitroglycerin and is not having chest pain or shortness of breath. Diabetes was poorly controlled for a while but is getting better now. Is trying to take her medicines and denies angina. She has no PND, orthopnea or claudication. No bleeding  complications from anticoagulation. She wears her CPAP device intermittently. 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;  Cardiovascular Illnesses:  atrial fibrillation, CAD;  Surgical Procedures:  left second toe surgery;  NYHA Classification:  I;  Canadian Angina Classification:  Class 0: Asymptomatic;  Cardiology Procedures-Invasive:  cardioversion June 2005, July 2006, cardiac cath (left) December 2013, Promus stent December 2013;  Cardiology Procedures-Noninvasive:  adenosine cardiolite July 2006, echocardiogram September 2009;  Cardiac Cath Results:  normal Left main, scattered irregularities LAD CFX, 99% stenosis proximal PDA;  LVEF of 60% documented via cardiac cath on 08/23/2012,  CHADS Score:  2,  CHA2DS2-VASC Score:  5 ____________________________ CARDIO-PULMONARY TEST DATES EKG Date:  07/02/2017;   Cardiac Cath Date:  08/23/2012;  Stent Placement Date: 08/23/2012;  Nuclear Study Date:  03/16/2005;  Echocardiography Date: 06/13/2008;  Chest Xray Date: 03/13/2005;   ____________________________ FAMILY HISTORY Father -- Father dead, Diabetes mellitus, Coronary artery bypass grafting, Hypertension Mother -- Mother dead, Coronary Artery Disease, Diabetes mellitus, Renal failure syndrome Sister -- Sister alive and well ____________________________ SOCIAL HISTORY Alcohol Use:  no alcohol use;  Smoking:  never smoked;  Diet:  diabetic diet;  Lifestyle:  married and 3 children;  Exercise:  no regular exercise;  Occupation:  retired;  Residence:  lives with husband;   ____________________________ REVIEW OF SYSTEMS General:  obesity, weight loss of approximately 5 lbs  Integumentary:history of eczema Respiratory: dyspnea with exertion, sleep apnea Cardiovascular:  please review HPI Abdominal: denies dyspepsia, GI bleeding, constipation, or diarrheaGenitourinary-Female:  frequency, nocturia Musculoskeletal:  arthritis of the knees Neurological:  memory loss, sleeps a lot Endocrine: diabetic, peripheral neuropathy  ____________________________ PHYSICAL EXAMINATION VITAL SIGNS  Blood Pressure:  140/70 Sitting, Left arm, large cuff  , 130/70 Standing, Left arm and large cuff   Pulse:  60/min. Weight:  266.00 lbs. Height:  70.00"BMI: 38  Constitutional:  pleasant white female, in no acute distress, severely obese Skin:  warm and dry to touch, no apparent skin lesions, or masses noted. Head:  normocephalic, normal hair pattern, no masses or tenderness ENT:  ears, nose and throat reveal no gross abnormalities.  Dentition good. Neck:  supple, no masses, thyromegaly, JVD. Carotid pulses are full and equal bilaterally without bruits. Chest:  normal symmetry, clear to auscultation. Cardiac:  irregular rhythm, normal S1 and S2, grade 2/6 systolic murmur Extremities & Back:  no edema present, no spinal abnormalities noted., normal muscle strength and tone. Neurological:  not oriented to date, no gross motor or sensory deficits noted ____________________________ MOST RECENT LIPID PANEL 05/20/17  CHOL TOTL 172 mg/dl, LDL 96 NM, HDL 38 mg/dl, TRIGLYCER 190 mg/dl, ALT 17 u/l, ALK PHOS 146 u/l and AST 23 u/l ____________________________ IMPRESSIONS/PLAN  1. Coronary artery disease with previous stenting with no angina 2. Chronic atrial fibrillation rate controlled 3. Long-term use of anticoagulation without complications 4. Hypertensive heart disease mildly elevated 5. Obesity but with some weight loss since here 6. Significant dementia  Recommendations:  Medicines than previous physicians notes reviewed. Labs reviewed since last year. Major issue is dementia and her husband is doing a good job helping to manage his although I think he is somewhat frustrated from it. No change in medicines this year and followup in one year. Twelve-lead EKG personally reviewed by me  shows atrial fibrillation with controlled ventricular response and nonspecific ST and T-wave changes. ____________________________ TODAYS ORDERS  1. 12 Lead EKG: Today  2. Return Visit: 1 year                       ____________________________ Cardiology Physician:  Kerry Hough MD [facility]

## 2018-07-08 ENCOUNTER — Encounter: Payer: Self-pay | Admitting: Cardiology

## 2018-07-08 ENCOUNTER — Ambulatory Visit (INDEPENDENT_AMBULATORY_CARE_PROVIDER_SITE_OTHER): Payer: Medicare HMO | Admitting: Cardiology

## 2018-07-08 VITALS — BP 116/64 | HR 71 | Ht 70.0 in | Wt 250.4 lb

## 2018-07-08 DIAGNOSIS — I482 Chronic atrial fibrillation, unspecified: Secondary | ICD-10-CM | POA: Diagnosis not present

## 2018-07-08 DIAGNOSIS — G4733 Obstructive sleep apnea (adult) (pediatric): Secondary | ICD-10-CM

## 2018-07-08 DIAGNOSIS — I350 Nonrheumatic aortic (valve) stenosis: Secondary | ICD-10-CM

## 2018-07-08 DIAGNOSIS — E1151 Type 2 diabetes mellitus with diabetic peripheral angiopathy without gangrene: Secondary | ICD-10-CM

## 2018-07-08 DIAGNOSIS — I2510 Atherosclerotic heart disease of native coronary artery without angina pectoris: Secondary | ICD-10-CM | POA: Diagnosis not present

## 2018-07-08 DIAGNOSIS — F0390 Unspecified dementia without behavioral disturbance: Secondary | ICD-10-CM

## 2018-07-08 DIAGNOSIS — Z9989 Dependence on other enabling machines and devices: Secondary | ICD-10-CM | POA: Diagnosis not present

## 2018-07-08 NOTE — Progress Notes (Signed)
Cardiology Office Note:    Date:  07/08/2018   ID:  Jenny Glover, DOB July 01, 1946, MRN [id number]  PCP:  Reynold Bowen, MD  Cardiologist:  Jenne Campus, MD    Referring MD: Reynold Bowen, MD   Chief Complaint  Patient presents with  . Follow-up  Doing well  History of Present Illness:    Jenny Glover is a 72 y.o. female with a dementia, coronary artery disease, diabetes, hypertension comes today to office for follow-up today's her birthday and I wish her happy birthday however she does not remember how well this year.  The biggest problem seems to be dementia her husband is with her and he tells me that she does not do much she just sits all day.  She does have some hallucinations she see people that does not exist anymore.  Denies have any chest pain tightness squeezing pressure burning chest.  No dizziness no passing out  Past Medical History:  Diagnosis Date  . Anxiety   . Atrial fibrillation ([place])    s/p DCCV 2005, 2006. Placed on Tikosyn 2006 (negative adenosine cardiolite 03/2005, could not measure EF).  . Depression   . Diabetes mellitus without complication ([place])   . Diabetic foot ulcer ([place])   . High cholesterol   . History of cardioversion 2005/2006  . Hyperlipemia   . Hypertension   . Mental status change   . Neuropathy   . Obesity   . Skin cancer of face    treated with radiation    Past Surgical History:  Procedure Laterality Date  . LEFT HEART CATHETERIZATION WITH CORONARY ANGIOGRAM N/A 08/23/2012   Procedure: LEFT HEART CATHETERIZATION WITH CORONARY ANGIOGRAM;  Surgeon: Jettie Booze, MD;  Location[facility] C[facility] LAB;  Service: Cardiovascular;  Laterality: N/A;  . PERCUTANEOUS CORONARY STENT INTERVENTION (PCI-S)  08/23/2012   Procedure: PERCUTANEOUS CORONARY STENT INTERVENTION (PCI-S);  Surgeon: Jettie Booze, MD;  Location: [facility] CATH LAB;  Service: Cardiovascular;;  . SKIN CANCER EXCISION    . TENOTOMY      Current Medications: Current  Meds  Medication Sig  . amLODipine (NORVASC) 5 MG tablet Take 5 mg by mouth daily.  Marland Kitchen atorvastatin (LIPITOR) 40 MG tablet Take 1 tablet (40 mg total) by mouth daily at 6 PM.  . blood glucose meter kit and supplies KIT Dispense based on patient and insurance preference. Use up to four times daily as directed. (FOR ICD-9 250.00, 250.01).  . cholecalciferol (VITAMIN D) 1000 UNITS tablet Take 2,000 Units by mouth daily.  . clopidogrel (PLAVIX) 75 MG tablet Take 75 mg by mouth daily.  Marland Kitchen escitalopram (LEXAPRO) 10 MG tablet Take 10 mg daily by mouth.  . insulin glargine (LANTUS) 100 UNIT/ML injection Inject 0.15 mLs (15 Units total) into the skin at bedtime. (Patient taking differently: Inject 50 Units into the skin daily. Breakfast.)  . insulin lispro (HUMALOG) 100 UNIT/ML injection Inject 10 Units into the skin 3 (three) times daily before meals.   . memantine (NAMENDA) 5 MG tablet Take 5 mg 2 (two) times daily by mouth.  . metoprolol succinate (TOPROL-XL) 100 MG 24 hr tablet Take 100 mg by mouth daily. Take with or immediately following a meal.  . Rivaroxaban (XARELTO) 20 MG TABS Take 1 tablet (20 mg total) by mouth daily.     Allergies:   Patient has no known allergies.   Social History   Socioeconomic History  . Marital status: Married    Spouse name: Not on file  .  Number of children: 3  . Years of education: College   . Highest education level: Not on file  Occupational History  . Occupation: retired Marine scientist  Social Needs  . Financial resource strain: Not on file  . Food insecurity:    Worry: Not on file    Inability: Not on file  . Transportation needs:    Medical: Not on file    Non-medical: Not on file  Tobacco Use  . Smoking status: Never Smoker  . Smokeless tobacco: Never Used  Substance and Sexual Activity  . Alcohol use: No    Alcohol/week: 0.0 standard drinks  . Drug use: No  . Sexual activity: Not on file  Lifestyle  . Physical activity:    Days per week: Not on  file    Minutes per session: Not on file  . Stress: Not on file  Relationships  . Social connections:    Talks on phone: Not on file    Gets together: Not on file    Attends religious service: Not on file    Active member of club or organization: Not on file    Attends meetings of clubs or organizations: Not on file    Relationship status: Not on file  Other Topics Concern  . Not on file  Social History Narrative   Occasionally drinks coffee, tea, soda      Family History: The patient's family history includes Coronary artery disease in her father; Diabetes in her father; Stroke in her mother. ROS:   Please see the history of present illness.    All 14 point review of systems negative except as described per history of present illness  EKGs/Labs/Other Studies Reviewed:      Recent Labs: No results found for requested labs within last 8760 hours.  Recent Lipid Panel    Component Value Date/Time   CHOL 248 (H) 08/22/2012 0200   TRIG 229 (H) 08/22/2012 0200   HDL 48 08/22/2012 0200   CHOLHDL 5.2 08/22/2012 0200   VLDL 46 (H) 08/22/2012 0200   LDLCALC 154 (H) 08/22/2012 0200    Physical Exam:    VS:  BP 116/64   Pulse 71   Ht _0  (1.778 m)   Wt 250 lb 6.4 oz (113.6 kg)   SpO2 97%   BMI 35.93 kg/m     Wt Readings from Last 3 Encounters:  07/08/18 250 lb 6.4 oz (113.6 kg)  03/31/16 282 lb (127.9 kg)  12/30/15 277 lb 6.4 oz (125.8 kg)     GEN:  Well nourished, well developed in no acute distress HEENT: Normal NECK: No JVD; No carotid bruits LYMPHATICS: No lymphadenopathy CARDIAC: Irregularly irregular, no murmurs, no rubs, no gallops RESPIRATORY:  Clear to auscultation without rales, wheezing or rhonchi  ABDOMEN: Soft, non-tender, non-distended MUSCULOSKELETAL:  No edema; No deformity  SKIN: Warm and dry LOWER EXTREMITIES: no swelling NEUROLOGIC:  Alert and oriented x 3 PSYCHIATRIC:  Normal affect   ASSESSMENT:    1. Coronary artery disease involving  native coronary artery of native heart without angina pectoris   2. Chronic atrial fibrillation   3. DM (diabetes mellitus) with peripheral vascular complication ([place])   4. OSA on CPAP   5. Senile dementia, uncomplicated ([place])   6. Morbid obesity ([place])   7. Nonrheumatic aortic valve stenosis    PLAN:    In order of problems listed above:  1. Chronic atrial fibrillation anticoagulated with Xarelto will continue. 2. Most likely aortic stenosis I will  schedule her to have echocardiogram to assess the severity of the valve I spoke to her husband obviously she would not be candidate for surgery however knowledge of degree of severe aortic valve is beneficial in terms of choices of medication she is on.  Therefore we will do the test 3. Obstructive sleep apnea does not use CPAP.  Simply cannot tolerate it. 4. Coronary artery disease stable on appropriate medication which include Plavix and stent Plavix is still on board secondary to her multiple risk factors for coronary artery disease that are difficult to modified. 5. Dyslipidemia her last LDL was higher than 90 need to be less than 70 I asked her to have fasting lipid profile checked and then will adjust medications accordingly I anticipate needs to increase dose of Lipitor to 80.   Medication Adjustments/Labs and Tests Ordered: Current medicines are reviewed at length with the patient today.  Concerns regarding medicines are outlined above.  No orders of the defined types were placed in this encounter.  Medication changes: No orders of the defined types were placed in this encounter.   Signed, Park Liter, MD, [facility] 07/08/2018 4:18 PM    [facility]

## 2018-07-08 NOTE — Patient Instructions (Signed)
Medication Instructions:  Your physician recommends that you continue on your current medications as directed. Please refer to the Current Medication list given to you today.  If you need a refill on your cardiac medications before your next appointment, please call your pharmacy.   Lab work: Your physician recommends that you return for lab work: Lipids when you have echocardiogram.   If you have labs (blood work) drawn today and your tests are completely normal, you will receive your results only by: Marland Kitchen MyChart Message (if you have MyChart) OR . A paper copy in the mail If you have any lab test that is abnormal or we need to change your treatment, we will call you to review the results.  Testing/Procedures: Your physician has requested that you have an echocardiogram. Echocardiography is a painless test that uses sound waves to create images of your heart. It provides your doctor with information about the size and shape of your heart and how well your heart's chambers and valves are working. This procedure takes approximately one hour. There are no restrictions for this procedure.    Follow-Up: At [facility], you and your health needs are our priority.  As part of our continuing mission to provide you with exceptional heart care, we have created designated Provider Care Teams.  These Care Teams include your primary Cardiologist (physician) and Advanced Practice Providers (APPs -  Physician Assistants and Nurse Practitioners) who all work together to provide you with the care you need, when you need it. You will need a follow up appointment in 6 months.  Please call our office 2 months in advance to schedule this appointment.  You may see No primary care provider on file. or another member of our [employer] Provider Team in [place]: Shirlee More, MD . Jyl Heinz, MD  Any Other Special Instructions Will Be Listed Below (If Applicable).  Echocardiogram An echocardiogram, or  echocardiography, uses sound waves (ultrasound) to produce an image of your heart. The echocardiogram is simple, painless, obtained within a short period of time, and offers valuable information to your health care provider. The images from an echocardiogram can provide information such as:  Evidence of coronary artery disease (CAD).  Heart size.  Heart muscle function.  Heart valve function.  Aneurysm detection.  Evidence of a past heart attack.  Fluid buildup around the heart.  Heart muscle thickening.  Assess heart valve function.  Tell a health care provider about:  Any allergies you have.  All medicines you are taking, including vitamins, herbs, eye drops, creams, and over-the-counter medicines.  Any problems you or family members have had with anesthetic medicines.  Any blood disorders you have.  Any surgeries you have had.  Any medical conditions you have.  Whether you are pregnant or may be pregnant. What happens before the procedure? No special preparation is needed. Eat and drink normally. What happens during the procedure?  In order to produce an image of your heart, gel will be applied to your chest and a wand-like tool (transducer) will be moved over your chest. The gel will help transmit the sound waves from the transducer. The sound waves will harmlessly bounce off your heart to allow the heart images to be captured in real-time motion. These images will then be recorded.  You may need an IV to receive a medicine that improves the quality of the pictures. What happens after the procedure? You may return to your normal schedule including diet, activities, and medicines, unless  your health care provider tells you otherwise. This information is not intended to replace advice given to you by your health care provider. Make sure you discuss any questions you have with your health care provider. Document Released: 08/28/2000 Document Revised: 04/18/2016 Document  Reviewed: 05/08/2013 Elsevier Interactive Patient Education  2017 [employer].

## 2018-07-22 ENCOUNTER — Ambulatory Visit (HOSPITAL_BASED_OUTPATIENT_CLINIC_OR_DEPARTMENT_OTHER)
Admission: RE | Admit: 2018-07-22 | Discharge: 2018-07-22 | Disposition: A | Discharge status: Home / Self Care | Payer: Medicare HMO | Source: Ambulatory Visit | Attending: Cardiology | Admitting: Cardiology

## 2018-07-22 DIAGNOSIS — I350 Nonrheumatic aortic (valve) stenosis: Secondary | ICD-10-CM | POA: Diagnosis not present

## 2018-07-22 DIAGNOSIS — I2510 Atherosclerotic heart disease of native coronary artery without angina pectoris: Secondary | ICD-10-CM | POA: Diagnosis not present

## 2018-07-22 NOTE — Progress Notes (Signed)
  Echocardiogram 2D Echocardiogram has been performed.  Jenny Glover Jenny Glover 07/22/2018, 10:27 AM

## 2018-07-23 LAB — LIPID PANEL
Chol/HDL Ratio: 4 ratio (ref 0.0–4.4)
Cholesterol, Total: 162 mg/dL (ref 100–199)
HDL: 41 mg/dL (ref >39)
LDL Calculated: 95 mg/dL (ref 0–99)
Triglycerides: 129 mg/dL (ref 0–149)
VLDL CHOLESTEROL CAL: 26 mg/dL (ref 5–40)

## 2018-07-25 ENCOUNTER — Telehealth: Payer: Self-pay | Admitting: Emergency Medicine

## 2018-07-25 ENCOUNTER — Telehealth: Payer: Self-pay

## 2018-07-25 DIAGNOSIS — E785 Hyperlipidemia, unspecified: Secondary | ICD-10-CM

## 2018-07-25 MED ORDER — ATORVASTATIN CALCIUM 80 MG PO TABS: 80.0000 mg | ORAL_TABLET | Freq: Every day | ORAL | 3 refills | Status: DC

## 2018-07-25 NOTE — Telephone Encounter (Signed)
Patient and her husband called office for results of testing. Results were given for both her Echo and lipid panel. She was advised to double her Atorvastatin to 80 mg. A new script was sent to her pharmacy. She was also notified to have her labs rechecked in 6 weeks fasting. Orders have been placed.

## 2018-07-25 NOTE — Telephone Encounter (Signed)
Left message for patient to return call regarding results  

## 2018-07-28 ENCOUNTER — Ambulatory Visit: Payer: Medicare Other | Admitting: Podiatry

## 2018-08-01 ENCOUNTER — Ambulatory Visit: Payer: Medicare HMO | Admitting: Orthotics

## 2018-08-01 ENCOUNTER — Ambulatory Visit (INDEPENDENT_AMBULATORY_CARE_PROVIDER_SITE_OTHER): Payer: Medicare HMO | Admitting: Podiatry

## 2018-08-01 ENCOUNTER — Encounter: Payer: Self-pay | Admitting: Podiatry

## 2018-08-01 DIAGNOSIS — E0842 Diabetes mellitus due to underlying condition with diabetic polyneuropathy: Secondary | ICD-10-CM

## 2018-08-01 DIAGNOSIS — B351 Tinea unguium: Secondary | ICD-10-CM | POA: Diagnosis not present

## 2018-08-01 DIAGNOSIS — L84 Corns and callosities: Secondary | ICD-10-CM

## 2018-08-01 DIAGNOSIS — L97401 Non-pressure chronic ulcer of unspecified heel and midfoot limited to breakdown of skin: Secondary | ICD-10-CM

## 2018-08-01 DIAGNOSIS — M79674 Pain in right toe(s): Secondary | ICD-10-CM

## 2018-08-01 DIAGNOSIS — M79675 Pain in left toe(s): Secondary | ICD-10-CM | POA: Diagnosis not present

## 2018-08-01 DIAGNOSIS — E1151 Type 2 diabetes mellitus with diabetic peripheral angiopathy without gangrene: Secondary | ICD-10-CM

## 2018-08-01 NOTE — Patient Instructions (Signed)

## 2018-08-01 NOTE — Progress Notes (Signed)

## 2018-08-02 NOTE — Progress Notes (Signed)
Subjective:   Patient ID: Jenny Glover, female   DOB: 72 y.o.   MRN: [id number]   HPI Patient with long-term diabetes has not been real good control who is done well with diabetic shoes and preventing ulceration presents for new diabetic shoes and for nail trim stating she cannot cut them there thick and they make it hard for her to wear shoe gear comfortably   ROS      Objective:  Physical Exam  Patient is noted to have diminished neurovascular status consistent with last year with thick yellow brittle nailbeds 1-5 both feet history of ulceration fifth metatarsal left and digital deformities bilateral     Assessment:  Chronic painful mycotic nail infection 1-5 both feet with lesion formation and hammertoe deformity     Plan:  H&P conditions reviewed debridement of nails accomplished with no iatrogenic bleeding and advised on new diabetic shoes to reduce stress on the feet.  Patient will be casted after we receive approval for diabetic shoes which are in this patient's best interest

## 2018-08-29 ENCOUNTER — Ambulatory Visit (INDEPENDENT_AMBULATORY_CARE_PROVIDER_SITE_OTHER): Payer: Self-pay | Admitting: Orthotics

## 2018-08-29 DIAGNOSIS — L97401 Non-pressure chronic ulcer of unspecified heel and midfoot limited to breakdown of skin: Secondary | ICD-10-CM | POA: Diagnosis not present

## 2018-08-29 DIAGNOSIS — E1040 Type 1 diabetes mellitus with diabetic neuropathy, unspecified: Secondary | ICD-10-CM

## 2018-08-29 DIAGNOSIS — E1151 Type 2 diabetes mellitus with diabetic peripheral angiopathy without gangrene: Secondary | ICD-10-CM | POA: Diagnosis not present

## 2018-08-29 DIAGNOSIS — L84 Corns and callosities: Secondary | ICD-10-CM

## 2018-08-29 NOTE — Progress Notes (Signed)

## 2018-10-04 DIAGNOSIS — Z794 Long term (current) use of insulin: Secondary | ICD-10-CM | POA: Diagnosis not present

## 2018-10-04 DIAGNOSIS — E1140 Type 2 diabetes mellitus with diabetic neuropathy, unspecified: Secondary | ICD-10-CM | POA: Diagnosis not present

## 2018-10-04 DIAGNOSIS — R32 Unspecified urinary incontinence: Secondary | ICD-10-CM | POA: Diagnosis not present

## 2018-10-04 DIAGNOSIS — I480 Paroxysmal atrial fibrillation: Secondary | ICD-10-CM | POA: Diagnosis not present

## 2018-10-04 DIAGNOSIS — E1142 Type 2 diabetes mellitus with diabetic polyneuropathy: Secondary | ICD-10-CM | POA: Diagnosis not present

## 2018-10-04 DIAGNOSIS — N183 Chronic kidney disease, stage 3 (moderate): Secondary | ICD-10-CM | POA: Diagnosis not present

## 2018-10-04 DIAGNOSIS — I2510 Atherosclerotic heart disease of native coronary artery without angina pectoris: Secondary | ICD-10-CM | POA: Diagnosis not present

## 2018-10-04 DIAGNOSIS — M25561 Pain in right knee: Secondary | ICD-10-CM | POA: Diagnosis not present

## 2018-10-04 DIAGNOSIS — I350 Nonrheumatic aortic (valve) stenosis: Secondary | ICD-10-CM | POA: Diagnosis not present

## 2018-11-01 ENCOUNTER — Ambulatory Visit: Payer: Medicare HMO | Admitting: Podiatry

## 2018-11-08 ENCOUNTER — Ambulatory Visit: Payer: Medicare HMO | Admitting: Podiatry

## 2018-11-08 DIAGNOSIS — M79674 Pain in right toe(s): Secondary | ICD-10-CM | POA: Diagnosis not present

## 2018-11-08 DIAGNOSIS — L84 Corns and callosities: Secondary | ICD-10-CM | POA: Diagnosis not present

## 2018-11-08 DIAGNOSIS — B351 Tinea unguium: Secondary | ICD-10-CM | POA: Diagnosis not present

## 2018-11-08 DIAGNOSIS — M79675 Pain in left toe(s): Secondary | ICD-10-CM | POA: Diagnosis not present

## 2018-11-08 DIAGNOSIS — E0842 Diabetes mellitus due to underlying condition with diabetic polyneuropathy: Secondary | ICD-10-CM | POA: Diagnosis not present

## 2018-11-08 NOTE — Patient Instructions (Signed)

## 2018-11-14 ENCOUNTER — Encounter: Payer: Self-pay | Admitting: Podiatry

## 2018-11-14 NOTE — Progress Notes (Signed)
Subjective: Jenny Glover presents for preventative diabetic foot care today.  She has history of diabetic neuropathy,  painful, mycotic toenails and chronic corns and calluses which put her at risk for foot wounds.  She does have history of wound of the right foot which is healed.  She voices no new pedal concerns on today's visit.  Reynold Bowen, MD is her PCP and her last visit was 2 months ago.  Her last A1c was 8.0.  Her blood sugar this morning was 210 mg/dL.  She is on blood thinner Xarelto.   Current Outpatient Medications:  .  amLODipine (NORVASC) 5 MG tablet, Take 5 mg by mouth daily., Disp: , Rfl:  .  atorvastatin (LIPITOR) 80 MG tablet, Take 1 tablet (80 mg total) by mouth daily at 6 PM., Disp: 30 tablet, Rfl: 3 .  blood glucose meter kit and supplies KIT, Dispense based on patient and insurance preference. Use up to four times daily as directed. (FOR ICD-9 250.00, 250.01)., Disp: 1 each, Rfl: 0 .  cholecalciferol (VITAMIN D) 1000 UNITS tablet, Take 2,000 Units by mouth daily., Disp: , Rfl:  .  clopidogrel (PLAVIX) 75 MG tablet, Take 75 mg by mouth daily., Disp: , Rfl:  .  escitalopram (LEXAPRO) 10 MG tablet, Take 10 mg daily by mouth., Disp: , Rfl:  .  insulin glargine (LANTUS) 100 UNIT/ML injection, Inject 0.15 mLs (15 Units total) into the skin at bedtime. (Patient taking differently: Inject 50 Units into the skin daily. Breakfast.), Disp: 10 mL, Rfl: 0 .  insulin lispro (HUMALOG) 100 UNIT/ML injection, Inject 10 Units into the skin 3 (three) times daily before meals. , Disp: , Rfl:  .  memantine (NAMENDA) 5 MG tablet, Take 5 mg 2 (two) times daily by mouth., Disp: , Rfl:  .  metoprolol succinate (TOPROL-XL) 100 MG 24 hr tablet, Take 100 mg by mouth daily. Take with or immediately following a meal., Disp: , Rfl:  .  NOVOLOG 100 UNIT/ML injection, INJECT 10 UNITS BEFORE EACH MEAL, Disp: , Rfl:  .  Rivaroxaban (XARELTO) 20 MG TABS, Take 1 tablet (20 mg total) by mouth daily., Disp:  30 tablet, Rfl: 0  No Known Allergies  Vascular Examination: Capillary refill time <3 seconds x 10 digits.  Dorsalis pedis pulses palpable bilaterally.  Posterior tibial pulses faintly palpable b/l.  No digital hair x 10 digits.  Skin temperature gradient within normal limits bilaterally Dermatological Examination: Skin with normal turgor, texture and tone b/l.  Toenails 1-5 b/l discolored, thick, dystrophic with subungual debris and pain with palpation to nailbeds due to thickness of nails.  There is evidence of old subungual hematoma of the left 2nd digit.  The nail plate remains adhered to the nailbed.  There is no fluid expressed from this area.  No surrounding erythema, no edema, no drainage, no flocculence noted.  Hyperkeratotic lesion distal tip right third digit and submetatarsal head 5 of the left foot.  There is no erythema, no edema, no drainage, no flocculence noted with any of these lesions.  Musculoskeletal: Muscle strength 5/5 to all LE muscle groups  Neurological: Sensation diminished with 10 gram monofilament. Vibratory sensation diminished  Assessment: 1. Painful onychomycosis toenails 1-5 b/l 2. Callus/submetatarsal head 5 of the left foot; corn distal tip right third digit 3. Old subungual hematoma left second digit, stable 4. NIDDM with Diabetic neuropathy  Plan: 1. Continue diabetic foot care principles.  Literature dispensed on today. 2. Toenails 1-5 b/l were debrided in length and  girth without iatrogenic bleeding. 3. Callus left foot and corn right third digit pared with sterile chisel blade without incident. 4. Patient to continue soft, supportive shoe gear.  Patient to report any pedal injuries to medical professional immediately. 5. Follow up 3 months.  6. Patient/POA to call should there be a concern in the interim.

## 2018-12-15 ENCOUNTER — Other Ambulatory Visit: Payer: Self-pay | Admitting: Cardiology

## 2019-01-23 ENCOUNTER — Telehealth: Payer: Self-pay | Admitting: Cardiology

## 2019-01-23 NOTE — Telephone Encounter (Signed)
Patient's husband, Kelle Darting that patient has been on both xarelto and plavix for 4 years. Recently she's had 2 nose bleeds, which he describes as a slow drip for about 30 minutes each and resolved with a cold compress.     Donald asked  Dr.Tilley why she needed to be on 2 blood thinners previously. He was told that because of her stent, she needed to be on plavix.   pls advise,  tx

## 2019-01-23 NOTE — Telephone Encounter (Signed)
The reason for Plavix is continuation of risk factors for coronary artery disease are difficult to modified.  If she started having difficulty with nosebleed Plavix can be stopped.  However I prefer her to continue one option will be to stop Plavix for a week or 2 and after that if she is no bleeding from the nose that this to be started back.

## 2019-01-23 NOTE — Telephone Encounter (Signed)
Patient's husband called stating patient is having some nose bleeds and is on 2 blood thinners. He wants to know if the blood thinners can be causing the nose bleeds.  Please call patient to discuss [phone]

## 2019-01-23 NOTE — Telephone Encounter (Signed)
Left voice message for Jenny Glover to call me back.

## 2019-01-24 NOTE — Telephone Encounter (Signed)
Phoned patient's husband Elenore Rota, informed that Dr. Agustin Cree would like him to stop Plavix for 1 week then resume again after 1 week and call us back if any further nosebleeds occur. He verbalized understanding and has no additional questions or concerns.

## 2019-01-31 ENCOUNTER — Ambulatory Visit: Payer: Medicare HMO | Admitting: Podiatry

## 2019-02-01 DIAGNOSIS — I350 Nonrheumatic aortic (valve) stenosis: Secondary | ICD-10-CM | POA: Diagnosis not present

## 2019-02-01 DIAGNOSIS — Z1331 Encounter for screening for depression: Secondary | ICD-10-CM | POA: Diagnosis not present

## 2019-02-01 DIAGNOSIS — F0150 Vascular dementia without behavioral disturbance: Secondary | ICD-10-CM | POA: Diagnosis not present

## 2019-02-01 DIAGNOSIS — I10 Essential (primary) hypertension: Secondary | ICD-10-CM | POA: Diagnosis not present

## 2019-02-01 DIAGNOSIS — E1140 Type 2 diabetes mellitus with diabetic neuropathy, unspecified: Secondary | ICD-10-CM | POA: Diagnosis not present

## 2019-02-01 DIAGNOSIS — N183 Chronic kidney disease, stage 3 (moderate): Secondary | ICD-10-CM | POA: Diagnosis not present

## 2019-02-01 DIAGNOSIS — G4733 Obstructive sleep apnea (adult) (pediatric): Secondary | ICD-10-CM | POA: Diagnosis not present

## 2019-02-01 DIAGNOSIS — E1142 Type 2 diabetes mellitus with diabetic polyneuropathy: Secondary | ICD-10-CM | POA: Diagnosis not present

## 2019-02-01 DIAGNOSIS — I2510 Atherosclerotic heart disease of native coronary artery without angina pectoris: Secondary | ICD-10-CM | POA: Diagnosis not present

## 2019-02-01 DIAGNOSIS — E785 Hyperlipidemia, unspecified: Secondary | ICD-10-CM | POA: Diagnosis not present

## 2019-03-15 ENCOUNTER — Ambulatory Visit: Payer: Medicare HMO | Admitting: Podiatry

## 2019-04-09 DIAGNOSIS — E785 Hyperlipidemia, unspecified: Secondary | ICD-10-CM | POA: Diagnosis not present

## 2019-04-09 DIAGNOSIS — I2510 Atherosclerotic heart disease of native coronary artery without angina pectoris: Secondary | ICD-10-CM | POA: Diagnosis not present

## 2019-04-09 DIAGNOSIS — G4733 Obstructive sleep apnea (adult) (pediatric): Secondary | ICD-10-CM | POA: Diagnosis not present

## 2019-04-09 DIAGNOSIS — F0150 Vascular dementia without behavioral disturbance: Secondary | ICD-10-CM | POA: Diagnosis not present

## 2019-04-09 DIAGNOSIS — I10 Essential (primary) hypertension: Secondary | ICD-10-CM | POA: Diagnosis not present

## 2019-04-09 DIAGNOSIS — N183 Chronic kidney disease, stage 3 (moderate): Secondary | ICD-10-CM | POA: Diagnosis not present

## 2019-04-09 DIAGNOSIS — E1140 Type 2 diabetes mellitus with diabetic neuropathy, unspecified: Secondary | ICD-10-CM | POA: Diagnosis not present

## 2019-04-09 DIAGNOSIS — I350 Nonrheumatic aortic (valve) stenosis: Secondary | ICD-10-CM | POA: Diagnosis not present

## 2019-04-09 DIAGNOSIS — E1142 Type 2 diabetes mellitus with diabetic polyneuropathy: Secondary | ICD-10-CM | POA: Diagnosis not present

## 2019-05-19 ENCOUNTER — Ambulatory Visit: Payer: Medicare HMO | Admitting: Podiatry

## 2019-06-06 DIAGNOSIS — F0150 Vascular dementia without behavioral disturbance: Secondary | ICD-10-CM | POA: Diagnosis not present

## 2019-06-06 DIAGNOSIS — E1140 Type 2 diabetes mellitus with diabetic neuropathy, unspecified: Secondary | ICD-10-CM | POA: Diagnosis not present

## 2019-06-06 DIAGNOSIS — N183 Chronic kidney disease, stage 3 (moderate): Secondary | ICD-10-CM | POA: Diagnosis not present

## 2019-06-06 DIAGNOSIS — Z794 Long term (current) use of insulin: Secondary | ICD-10-CM | POA: Diagnosis not present

## 2019-06-06 DIAGNOSIS — G4733 Obstructive sleep apnea (adult) (pediatric): Secondary | ICD-10-CM | POA: Diagnosis not present

## 2019-06-06 DIAGNOSIS — I2510 Atherosclerotic heart disease of native coronary artery without angina pectoris: Secondary | ICD-10-CM | POA: Diagnosis not present

## 2019-06-06 DIAGNOSIS — E1122 Type 2 diabetes mellitus with diabetic chronic kidney disease: Secondary | ICD-10-CM | POA: Diagnosis not present

## 2019-06-06 DIAGNOSIS — E785 Hyperlipidemia, unspecified: Secondary | ICD-10-CM | POA: Diagnosis not present

## 2019-06-06 DIAGNOSIS — E1165 Type 2 diabetes mellitus with hyperglycemia: Secondary | ICD-10-CM | POA: Diagnosis not present

## 2019-06-06 DIAGNOSIS — E1142 Type 2 diabetes mellitus with diabetic polyneuropathy: Secondary | ICD-10-CM | POA: Diagnosis not present

## 2019-06-06 DIAGNOSIS — I480 Paroxysmal atrial fibrillation: Secondary | ICD-10-CM | POA: Diagnosis not present

## 2019-06-06 DIAGNOSIS — I350 Nonrheumatic aortic (valve) stenosis: Secondary | ICD-10-CM | POA: Diagnosis not present

## 2019-06-06 DIAGNOSIS — M25561 Pain in right knee: Secondary | ICD-10-CM | POA: Diagnosis not present

## 2019-06-22 ENCOUNTER — Other Ambulatory Visit: Payer: Self-pay | Admitting: Cardiology

## 2019-07-06 DIAGNOSIS — Z23 Encounter for immunization: Secondary | ICD-10-CM | POA: Diagnosis not present

## 2019-07-21 ENCOUNTER — Other Ambulatory Visit: Payer: Self-pay | Admitting: Cardiology

## 2019-07-25 ENCOUNTER — Other Ambulatory Visit: Payer: Self-pay | Admitting: Cardiology

## 2019-07-26 NOTE — Telephone Encounter (Signed)
15 day supply sent today. Needs office visit, left note for pharmacist.  30 day supply sent 06/2019 with notification that she will require office visit for further refill.   Loel Dubonnet, NP

## 2019-08-14 NOTE — Progress Notes (Signed)
Office Visit    Patient Name: Jenny Glover Date of Encounter: 08/15/2019  Primary Care Provider:  Reynold Bowen, MD Primary Cardiologist:  Jenne Campus, MD Electrophysiologist:  None   Chief Complaint    Jenny Glover is a 73 y.o. female with a hx of CAD, DM2, HTN, atrial fibrillation (DCCV 2005/2006, tikosyn 2006), CKDIII, AV stenosis, OSA, memory impairment/dementia presents today for annual follow up of CAD and HTN.   Past Medical History    Past Medical History:  Diagnosis Date   Anxiety    Atrial fibrillation [facility])    s/p DCCV 2005, 2006. Placed on Tikosyn 2006 (negative adenosine cardiolite 03/2005, could not measure EF).   Depression    Diabetes mellitus without complication ([place])    Diabetic foot ulcer ([place])    High cholesterol    History of cardioversion 2005/2006   Hyperlipemia    Hypertension    Mental status change    Neuropathy    Obesity    Skin cancer of face    treated with radiation   Past Surgical History:  Procedure Laterality Date   LEFT HEART CATHETERIZATION WITH CORONARY ANGIOGRAM N/A 08/23/2012   Procedure: LEFT HEART CATHETERIZATION WITH CORONARY ANGIOGRAM;  Surgeon: Jettie Booze, MD;  Location: [facility] CATH LAB;  Service: Cardiovascular;  Laterality: N/A;   PERCUTANEOUS CORONARY STENT INTERVENTION (PCI-S)  08/23/2012   Procedure: PERCUTANEOUS CORONARY STENT INTERVENTION (PCI-S);  Surgeon: Jettie Booze, MD;  Location: [facility] CATH LAB;  Service: Cardiovascular;;   SKIN CANCER EXCISION     TENOTOMY     Allergies No Known Allergies  History of Present Illness    Jenny Glover is a 73 y.o. female with a hx of hx of CAD (DES to PDA 2013), DM2, HTN, chronic atrial fibrillation (DCCV 2005/2006, tikosyn 2006), CKDIII, AV stenosis, OSA, CVA 02/2015, memory impairment/dementia last seen 07/08/18 by Dr. Agustin Cree.  Present today with her husband due to memory impairment. He assists her with ADLs at home. Reports increased  dyspnea with exertion since last office visit. Notices she fatigues easily. Discussed her moderate aortic stenosis as likely etiology. She denies chest pain, no near-syncope or syncope. Has rare episodes of dizziness if her husband helps her up too quickly.   Discussion regarding aortic stenosis and typical progression. Discussed that typically manage with medications until progresses to severe. Discussed options of TAVR vs surgical AVR if her symptoms have progressed to severe. Unfortunately with her multiple comorbidities it is difficult to say whether surgery would be of benefit. Will plan to repeat echocardiogram for further guidance. Discussed need to keep her BP and fluid volume well controlled. She reports no edema.  She has had multiple falls over the last 6 months. Thankfully without injury as she is on chronic anticoagulation due to atrial fibrillation and hx of CVA. Discussed that if she ever falls and hits her head she needs to present to ED for CT scan. Recommend discussing with PCP consideration of PT referral as may be able to help with balance and strength.  Discussed atrial fibrillation at length. Husband asks if she could be shocked back into rhythm. Discussed that as she has been in atrial fibrillation so long unlikely, particularly with dilated bilateral atrium. Low suspicion that her symptoms are coming from atrial fibrillation as this is longstanding diagnosis. She is rate controlled during our office visit today.   She is somewhat frail. Husband notes her weight has decreased a lot in the last few months.  Reports no change in appetite. He has to assist her with bathing, dressing, etc.   EKGs/Labs/Other Studies Reviewed:   The following studies were reviewed today:  Echo 07/22/2018 Study Conclusions - Left ventricle: The cavity size was normal. Wall thickness was   increased in a pattern of moderate LVH. Systolic function was   normal. The estimated ejection fraction was in the  range of 60%   to 65%. Wall motion was normal; there were no regional wall   motion abnormalities. - Aortic valve: There was moderate stenosis. Valve area (VTI): 0.65   cm^2. Valve area (Vmax): 0.61 cm^2. Valve area (Vmean): 0.65   cm^2. - Mitral valve: There was mild regurgitation. - Left atrium: The atrium was moderately dilated. - Right atrium: The atrium was moderately dilated. - Pulmonary arteries: PA peak pressure: 56 mm Hg (S).  EKG:  EKG is ordered today.  The ekg ordered today demonstrates rate controlled atrial fibrillation with poor R wave progression and no acute ST/T wave changes.  Recent Labs: 10/04/18 via [facility] BUN 41, creatinine 1.7, TSH 1.3   Recent Lipid Panel 10/04/18 via [facility]: Total cholesterol 146, HDL 33, LDL 85, triglycerides 138 06/06/19 via [facility]: A1c 7.2    Component Value Date/Time   CHOL 162 07/22/2018 0815   TRIG 129 07/22/2018 0815   HDL 41 07/22/2018 0815   CHOLHDL 4.0 07/22/2018 0815   CHOLHDL 5.2 08/22/2012 0200   VLDL 46 (H) 08/22/2012 0200   LDLCALC 95 07/22/2018 0815    Home Medications   Current Meds  Medication Sig   amLODipine (NORVASC) 5 MG tablet Take 5 mg by mouth daily.   atorvastatin (LIPITOR) 80 MG tablet Take 1 tablet (80 mg total) by mouth daily at 6 PM.   blood glucose meter kit and supplies KIT Dispense based on patient and insurance preference. Use up to four times daily as directed. (FOR ICD-9 250.00, 250.01).   cholecalciferol (VITAMIN D) 1000 UNITS tablet Take 2,000 Units by mouth daily.   escitalopram (LEXAPRO) 10 MG tablet Take 10 mg daily by mouth.   insulin glargine (LANTUS) 100 UNIT/ML injection Inject 0.15 mLs (15 Units total) into the skin at bedtime. (Patient taking differently: Inject 50 Units into the skin daily. Breakfast.)   insulin lispro (HUMALOG) 100 UNIT/ML injection Inject 10 Units into the skin 3 (three) times daily before meals.    memantine (NAMENDA) 5 MG tablet Take 5 mg 2 (two) times daily by mouth.     metoprolol succinate (TOPROL-XL) 100 MG 24 hr tablet Take 100 mg by mouth daily. Take with or immediately following a meal.   Rivaroxaban (XARELTO) 20 MG TABS Take 1 tablet (20 mg total) by mouth daily.   [DISCONTINUED] atorvastatin (LIPITOR) 80 MG tablet TAKE 1 TABLET BY MOUTH ONCE DAILY AT 6 PM. NEEDS APPOINTMENT OR FURTHER REFILLS    Review of Systems      Review of Systems  Constitution: Positive for malaise/fatigue. Negative for chills and fever.  Cardiovascular: Positive for dyspnea on exertion. Negative for chest pain, irregular heartbeat, leg swelling, near-syncope, orthopnea and palpitations.  Respiratory: Positive for shortness of breath. Negative for cough and wheezing.   Musculoskeletal: Positive for falls.  Gastrointestinal: Negative for melena, nausea and vomiting.  Genitourinary: Negative for hematuria.  Neurological: Positive for dizziness ("very rarely"). Negative for light-headedness.   All other systems reviewed and are otherwise negative except as noted above.  Physical Exam    VS:  BP 132/60 (BP Location: Left Arm, Patient Position:  Sitting, Cuff Size: Large)    Pulse 77    Resp 18    Ht 5' 10"  (1.778 m)    Wt 191 lb 12 oz (87 kg)    SpO2 95%    BMI 27.51 kg/m  , BMI Body mass index is 27.51 kg/m. GEN: Well nourished, well developed, in no acute distress. HEENT: normal. Neck: Supple, no JVD, carotid bruits, or masses. Cardiac: irregularly irregular, gr 2/6 systolic murmur,no rubs, or gallops. No clubbing, cyanosis, edema.  Radials/DP/PT 2+ and equal bilaterally.  Respiratory:  Respirations regular and unlabored, clear to auscultation bilaterally. GI: Soft, nontender, nondistended, BS + x 4. MS: No deformity or atrophy. Skin: Warm and dry, no rash. Neuro:  Strength and sensation are intact. Psych: Normal affect.  Assessment & Plan    1. CAD - s/p DES to PDA 2013. Stable with no anginal symptoms. EKG today without acute ST/T wave changes. Intolerant of  Plavix with nosebleed. GDMT statin, beta blocker. No aspirin secondary to chronic anticoagulation.  2. HTN - BP well controlled. Checks intermittently at home. Continue present antihypertensive regimen.   3. Chronic atrial fibrillation - Reports very intermittent palpitations, overall asymptomatic. Rate controlled, continue Metoprolol. Anticoagulated on Xarelto.   4. Long term anticoagulation - Secondary to chronic atrial fibrillation. Does note multiple falls which is of concern, her husband will inquire with her PCP about PT at home. Continue Xarelto.   5. Moderate aortic stenosis - By echo 07/2018 moderate AS trileaflet, EF 05-25%, diastolic indeterminite. She reports dyspnea on exertion and fatigue. No near-syncope nor chest pain. Repeat echocardiogram. Continue optimization of BP and fluid volume in interim. She is euvolemic on exam. Unfortunately difficult candidate for surgery due to other comorbidities. If echo returns with moderate AS, continue to optimize medications and monitor. If echo returns with severe AS, discuss risk/benefit of repair and consider referral to valve team or ask Dr. Burt Knack to review images.  6. HLD LDL goal <70 - Refill Atorvastatin. She has been out 8 days. Lipid/liver function in 6 weeks either at our office or with PCP.  7. OSA - Intolerant of CPAP.   8. S/p CVA - In 2016 with noted memory impairment. Husband assisted with visit today. She continues on Namenda. Follows with her PCP.   9. DM2 - Follows with PCP. A2c 05/2019 was 7.2. If additional agent needed, consider SGLT2i for cardioprotective benefit.  Disposition: Echocardiogram. If results changed, will plan for in-office visit to discus. Otherwise, follow up in 6 month(s) with Dr. Moshe Cipro, NP 08/15/2019, 3:35 PM

## 2019-08-15 ENCOUNTER — Encounter: Payer: Self-pay | Admitting: Family

## 2019-08-15 ENCOUNTER — Encounter (HOSPITAL_COMMUNITY): Payer: Self-pay | Admitting: Internal Medicine

## 2019-08-15 ENCOUNTER — Ambulatory Visit (INDEPENDENT_AMBULATORY_CARE_PROVIDER_SITE_OTHER): Payer: Medicare HMO | Admitting: Family

## 2019-08-15 ENCOUNTER — Emergency Department (HOSPITAL_COMMUNITY): Payer: Medicare HMO

## 2019-08-15 ENCOUNTER — Inpatient Hospital Stay (HOSPITAL_COMMUNITY)
Admission: EM | Admit: 2019-08-15 | Discharge: 2019-08-23 | DRG: 480 | Disposition: A | Discharge status: Skilled Nursing Facility | Payer: Medicare HMO | Attending: Internal Medicine | Admitting: Internal Medicine

## 2019-08-15 ENCOUNTER — Other Ambulatory Visit: Payer: Self-pay

## 2019-08-15 VITALS — BP 132/60 | HR 77 | Resp 18 | Ht 70.0 in | Wt 191.8 lb

## 2019-08-15 DIAGNOSIS — N170 Acute kidney failure with tubular necrosis: Secondary | ICD-10-CM | POA: Diagnosis not present

## 2019-08-15 DIAGNOSIS — E861 Hypovolemia: Secondary | ICD-10-CM | POA: Diagnosis not present

## 2019-08-15 DIAGNOSIS — M2550 Pain in unspecified joint: Secondary | ICD-10-CM | POA: Diagnosis not present

## 2019-08-15 DIAGNOSIS — N179 Acute kidney failure, unspecified: Secondary | ICD-10-CM | POA: Diagnosis not present

## 2019-08-15 DIAGNOSIS — Z03818 Encounter for observation for suspected exposure to other biological agents ruled out: Secondary | ICD-10-CM | POA: Diagnosis not present

## 2019-08-15 DIAGNOSIS — I2510 Atherosclerotic heart disease of native coronary artery without angina pectoris: Secondary | ICD-10-CM | POA: Diagnosis present

## 2019-08-15 DIAGNOSIS — I350 Nonrheumatic aortic (valve) stenosis: Secondary | ICD-10-CM

## 2019-08-15 DIAGNOSIS — I482 Chronic atrial fibrillation, unspecified: Secondary | ICD-10-CM

## 2019-08-15 DIAGNOSIS — I10 Essential (primary) hypertension: Secondary | ICD-10-CM | POA: Diagnosis not present

## 2019-08-15 DIAGNOSIS — R5381 Other malaise: Secondary | ICD-10-CM | POA: Diagnosis not present

## 2019-08-15 DIAGNOSIS — E875 Hyperkalemia: Secondary | ICD-10-CM | POA: Diagnosis not present

## 2019-08-15 DIAGNOSIS — D62 Acute posthemorrhagic anemia: Secondary | ICD-10-CM | POA: Diagnosis not present

## 2019-08-15 DIAGNOSIS — Z7901 Long term (current) use of anticoagulants: Secondary | ICD-10-CM

## 2019-08-15 DIAGNOSIS — E1140 Type 2 diabetes mellitus with diabetic neuropathy, unspecified: Secondary | ICD-10-CM | POA: Diagnosis present

## 2019-08-15 DIAGNOSIS — N1832 Chronic kidney disease, stage 3b: Secondary | ICD-10-CM | POA: Diagnosis present

## 2019-08-15 DIAGNOSIS — Z7401 Bed confinement status: Secondary | ICD-10-CM | POA: Diagnosis not present

## 2019-08-15 DIAGNOSIS — I119 Hypertensive heart disease without heart failure: Secondary | ICD-10-CM | POA: Diagnosis not present

## 2019-08-15 DIAGNOSIS — E872 Acidosis: Secondary | ICD-10-CM | POA: Diagnosis not present

## 2019-08-15 DIAGNOSIS — I959 Hypotension, unspecified: Secondary | ICD-10-CM | POA: Diagnosis not present

## 2019-08-15 DIAGNOSIS — Z9989 Dependence on other enabling machines and devices: Secondary | ICD-10-CM | POA: Diagnosis not present

## 2019-08-15 DIAGNOSIS — F329 Major depressive disorder, single episode, unspecified: Secondary | ICD-10-CM | POA: Diagnosis present

## 2019-08-15 DIAGNOSIS — D631 Anemia in chronic kidney disease: Secondary | ICD-10-CM | POA: Diagnosis present

## 2019-08-15 DIAGNOSIS — M1711 Unilateral primary osteoarthritis, right knee: Secondary | ICD-10-CM | POA: Diagnosis present

## 2019-08-15 DIAGNOSIS — Z923 Personal history of irradiation: Secondary | ICD-10-CM

## 2019-08-15 DIAGNOSIS — N184 Chronic kidney disease, stage 4 (severe): Secondary | ICD-10-CM | POA: Diagnosis not present

## 2019-08-15 DIAGNOSIS — W010XXA Fall on same level from slipping, tripping and stumbling without subsequent striking against object, initial encounter: Secondary | ICD-10-CM | POA: Diagnosis present

## 2019-08-15 DIAGNOSIS — E1122 Type 2 diabetes mellitus with diabetic chronic kidney disease: Secondary | ICD-10-CM | POA: Diagnosis present

## 2019-08-15 DIAGNOSIS — S72141D Displaced intertrochanteric fracture of right femur, subsequent encounter for closed fracture with routine healing: Secondary | ICD-10-CM | POA: Diagnosis not present

## 2019-08-15 DIAGNOSIS — E46 Unspecified protein-calorie malnutrition: Secondary | ICD-10-CM | POA: Diagnosis present

## 2019-08-15 DIAGNOSIS — S72141A Displaced intertrochanteric fracture of right femur, initial encounter for closed fracture: Secondary | ICD-10-CM | POA: Diagnosis not present

## 2019-08-15 DIAGNOSIS — G4733 Obstructive sleep apnea (adult) (pediatric): Secondary | ICD-10-CM | POA: Diagnosis not present

## 2019-08-15 DIAGNOSIS — I4891 Unspecified atrial fibrillation: Secondary | ICD-10-CM | POA: Diagnosis not present

## 2019-08-15 DIAGNOSIS — Z8781 Personal history of (healed) traumatic fracture: Secondary | ICD-10-CM

## 2019-08-15 DIAGNOSIS — E1129 Type 2 diabetes mellitus with other diabetic kidney complication: Secondary | ICD-10-CM | POA: Diagnosis not present

## 2019-08-15 DIAGNOSIS — Z79899 Other long term (current) drug therapy: Secondary | ICD-10-CM

## 2019-08-15 DIAGNOSIS — Y92009 Unspecified place in unspecified non-institutional (private) residence as the place of occurrence of the external cause: Secondary | ICD-10-CM | POA: Diagnosis not present

## 2019-08-15 DIAGNOSIS — E119 Type 2 diabetes mellitus without complications: Secondary | ICD-10-CM | POA: Diagnosis not present

## 2019-08-15 DIAGNOSIS — I1310 Hypertensive heart and chronic kidney disease without heart failure, with stage 1 through stage 4 chronic kidney disease, or unspecified chronic kidney disease: Secondary | ICD-10-CM | POA: Diagnosis present

## 2019-08-15 DIAGNOSIS — L89322 Pressure ulcer of left buttock, stage 2: Secondary | ICD-10-CM | POA: Diagnosis present

## 2019-08-15 DIAGNOSIS — F419 Anxiety disorder, unspecified: Secondary | ICD-10-CM | POA: Diagnosis present

## 2019-08-15 DIAGNOSIS — E1165 Type 2 diabetes mellitus with hyperglycemia: Secondary | ICD-10-CM | POA: Diagnosis not present

## 2019-08-15 DIAGNOSIS — L8990 Pressure ulcer of unspecified site, unspecified stage: Secondary | ICD-10-CM | POA: Insufficient documentation

## 2019-08-15 DIAGNOSIS — F0390 Unspecified dementia without behavioral disturbance: Secondary | ICD-10-CM | POA: Diagnosis present

## 2019-08-15 DIAGNOSIS — Z8249 Family history of ischemic heart disease and other diseases of the circulatory system: Secondary | ICD-10-CM

## 2019-08-15 DIAGNOSIS — Z955 Presence of coronary angioplasty implant and graft: Secondary | ICD-10-CM

## 2019-08-15 DIAGNOSIS — Z833 Family history of diabetes mellitus: Secondary | ICD-10-CM

## 2019-08-15 DIAGNOSIS — E785 Hyperlipidemia, unspecified: Secondary | ICD-10-CM | POA: Diagnosis present

## 2019-08-15 DIAGNOSIS — M25551 Pain in right hip: Secondary | ICD-10-CM | POA: Diagnosis present

## 2019-08-15 DIAGNOSIS — S72001A Fracture of unspecified part of neck of right femur, initial encounter for closed fracture: Secondary | ICD-10-CM | POA: Diagnosis not present

## 2019-08-15 DIAGNOSIS — N390 Urinary tract infection, site not specified: Secondary | ICD-10-CM | POA: Diagnosis not present

## 2019-08-15 DIAGNOSIS — S299XXA Unspecified injury of thorax, initial encounter: Secondary | ICD-10-CM | POA: Diagnosis not present

## 2019-08-15 DIAGNOSIS — Z20828 Contact with and (suspected) exposure to other viral communicable diseases: Secondary | ICD-10-CM | POA: Diagnosis present

## 2019-08-15 DIAGNOSIS — E1151 Type 2 diabetes mellitus with diabetic peripheral angiopathy without gangrene: Secondary | ICD-10-CM | POA: Diagnosis present

## 2019-08-15 DIAGNOSIS — D649 Anemia, unspecified: Secondary | ICD-10-CM | POA: Diagnosis not present

## 2019-08-15 DIAGNOSIS — Z794 Long term (current) use of insulin: Secondary | ICD-10-CM

## 2019-08-15 DIAGNOSIS — Z85828 Personal history of other malignant neoplasm of skin: Secondary | ICD-10-CM

## 2019-08-15 DIAGNOSIS — E118 Type 2 diabetes mellitus with unspecified complications: Secondary | ICD-10-CM | POA: Diagnosis not present

## 2019-08-15 DIAGNOSIS — B9620 Unspecified Escherichia coli [E. coli] as the cause of diseases classified elsewhere: Secondary | ICD-10-CM | POA: Diagnosis not present

## 2019-08-15 DIAGNOSIS — Z823 Family history of stroke: Secondary | ICD-10-CM

## 2019-08-15 DIAGNOSIS — W19XXXA Unspecified fall, initial encounter: Secondary | ICD-10-CM

## 2019-08-15 LAB — CBC WITH DIFFERENTIAL/PLATELET
Abs Immature Granulocytes: 0.11 10*3/uL — ABNORMAL HIGH (ref 0.00–0.07)
Basophils Absolute: 0.1 10*3/uL (ref 0.0–0.1)
Basophils Relative: 1 %
Eosinophils Absolute: 0.1 10*3/uL (ref 0.0–0.5)
Eosinophils Relative: 0 %
HCT: 36.6 % (ref 36.0–46.0)
Hemoglobin: 10.9 g/dL — ABNORMAL LOW (ref 12.0–15.0)
Immature Granulocytes: 1 %
Lymphocytes Relative: 7 %
Lymphs Abs: 1.3 10*3/uL (ref 0.7–4.0)
MCH: 28.3 pg (ref 26.0–34.0)
MCHC: 29.8 g/dL — ABNORMAL LOW (ref 30.0–36.0)
MCV: 95.1 fL (ref 80.0–100.0)
Monocytes Absolute: 1 10*3/uL (ref 0.1–1.0)
Monocytes Relative: 6 %
Neutro Abs: 15.2 10*3/uL — ABNORMAL HIGH (ref 1.7–7.7)
Neutrophils Relative %: 85 %
Platelets: 342 10*3/uL (ref 150–400)
RBC: 3.85 MIL/uL — ABNORMAL LOW (ref 3.87–5.11)
RDW: 14.6 % (ref 11.5–15.5)
WBC: 17.8 10*3/uL — ABNORMAL HIGH (ref 4.0–10.5)
nRBC: 0 % (ref 0.0–0.2)

## 2019-08-15 LAB — COMPREHENSIVE METABOLIC PANEL
ALT: 12 U/L (ref 0–44)
AST: 22 U/L (ref 15–41)
Albumin: 3.1 g/dL — ABNORMAL LOW (ref 3.5–5.0)
Alkaline Phosphatase: 105 U/L (ref 38–126)
Anion gap: 12 (ref 5–15)
BUN: 36 mg/dL — ABNORMAL HIGH (ref 8–23)
CO2: 21 mmol/L — ABNORMAL LOW (ref 22–32)
Calcium: 8.9 mg/dL (ref 8.9–10.3)
Chloride: 105 mmol/L (ref 98–111)
Creatinine, Ser: 2.03 mg/dL — ABNORMAL HIGH (ref 0.44–1.00)
GFR calc Af Amer: 28 mL/min — ABNORMAL LOW (ref >60)
GFR calc non Af Amer: 24 mL/min — ABNORMAL LOW (ref >60)
Glucose, Bld: 324 mg/dL — ABNORMAL HIGH (ref 70–99)
Potassium: 5.3 mmol/L — ABNORMAL HIGH (ref 3.5–5.1)
Sodium: 138 mmol/L (ref 135–145)
Total Bilirubin: 0.6 mg/dL (ref 0.3–1.2)
Total Protein: 7 g/dL (ref 6.5–8.1)

## 2019-08-15 MED ORDER — INSULIN ASPART 100 UNIT/ML ~~LOC~~ SOLN
0.0000 [IU] | SUBCUTANEOUS | Status: DC
  Administered 2019-08-16 (×3): 9 [IU] via SUBCUTANEOUS
  Administered 2019-08-16: 7 [IU] via SUBCUTANEOUS
  Administered 2019-08-16 – 2019-08-17 (×2): 9 [IU] via SUBCUTANEOUS
  Administered 2019-08-17: 7 [IU] via SUBCUTANEOUS
  Administered 2019-08-17 (×4): 9 [IU] via SUBCUTANEOUS
  Administered 2019-08-18: 7 [IU] via SUBCUTANEOUS
  Administered 2019-08-18 (×2): 9 [IU] via SUBCUTANEOUS
  Administered 2019-08-18 (×2): 7 [IU] via SUBCUTANEOUS
  Administered 2019-08-18 – 2019-08-19 (×3): 3 [IU] via SUBCUTANEOUS
  Administered 2019-08-19: 2 [IU] via SUBCUTANEOUS
  Administered 2019-08-19 (×2): 3 [IU] via SUBCUTANEOUS
  Administered 2019-08-19: 2 [IU] via SUBCUTANEOUS
  Administered 2019-08-20 (×4): 7 [IU] via SUBCUTANEOUS

## 2019-08-15 MED ORDER — MORPHINE SULFATE (PF) 2 MG/ML IV SOLN
0.5000 mg | INTRAVENOUS | Status: DC | PRN
  Administered 2019-08-16 (×3): 0.5 mg via INTRAVENOUS
  Filled 2019-08-15 (×3): qty 1

## 2019-08-15 MED ORDER — ATORVASTATIN CALCIUM 80 MG PO TABS: 80.0000 mg | ORAL_TABLET | Freq: Every day | ORAL | 3 refills | Status: DC

## 2019-08-15 MED ORDER — SODIUM CHLORIDE 0.9 % IV SOLN
INTRAVENOUS | Status: DC
  Administered 2019-08-16: 01:00:00 via INTRAVENOUS

## 2019-08-15 NOTE — ED Provider Notes (Signed)
[facility] EMERGENCY DEPARTMENT Provider Note   CSN: [id number] Arrival date & time: 08/15/19  1837     History   Chief Complaint Chief Complaint  Patient presents with  . Leg Pain    HPI Jenny Glover is a 73 y.o. female.     Patient complains of right hip pain.  Patient slipped and fell on her right hip.  Patient did not hit her head  The history is provided by the patient. No language interpreter was used.  Leg Pain Location:  Hip Injury: yes   Hip location:  R hip Pain details:    Quality:  Aching   Radiates to:  Does not radiate   Severity:  Moderate   Onset quality:  Sudden   Timing:  Constant   Progression:  Worsening Chronicity:  New Dislocation: no   Associated symptoms: no back pain and no fatigue     Past Medical History:  Diagnosis Date  . Anxiety   . Atrial fibrillation ([place])    s/p DCCV 2005, 2006. Placed on Tikosyn 2006 (negative adenosine cardiolite 03/2005, could not measure EF).  . Depression   . Diabetes mellitus without complication ([place])   . Diabetic foot ulcer ([place])   . High cholesterol   . History of cardioversion 2005/2006  . Hyperlipemia   . Hypertension   . Mental status change   . Neuropathy   . Obesity   . Skin cancer of face    treated with radiation    Patient Active Problem List   Diagnosis Date Noted  . Closed right hip fracture, initial encounter ([place]) 08/15/2019  . Aortic stenosis 07/08/2018  . Senile dementia, uncomplicated ([place]) 62/70/3500  . OSA on CPAP 12/30/2015  . CAD (coronary artery disease) 08/23/2012  . Morbid obesity ([place])   . DM (diabetes mellitus) with peripheral vascular complication ([place]) 93/81/8299  . Hypertensive heart disease   . Chronic atrial fibrillation   . Hyperlipidemia     Past Surgical History:  Procedure Laterality Date  . LEFT HEART CATHETERIZATION WITH CORONARY ANGIOGRAM N/A 08/23/2012   Procedure: LEFT HEART CATHETERIZATION WITH CORONARY ANGIOGRAM;  Surgeon:  Jettie Booze, MD;  Location: [facility] CATH LAB;  Service: Cardiovascular;  Laterality: N/A;  . PERCUTANEOUS CORONARY STENT INTERVENTION (PCI-S)  08/23/2012   Procedure: PERCUTANEOUS CORONARY STENT INTERVENTION (PCI-S);  Surgeon: Jettie Booze, MD;  Location: [facility] CATH LAB;  Service: Cardiovascular;;  . SKIN CANCER EXCISION    . TENOTOMY       OB History   No obstetric history on file.      Home Medications    Prior to Admission medications   Medication Sig Start Date End Date Taking? Authorizing Provider  amLODipine (NORVASC) 5 MG tablet Take 5 mg by mouth daily.    [provider]  atorvastatin (LIPITOR) 80 MG tablet Take 1 tablet (80 mg total) by mouth daily at 6 PM. 08/15/19   Loel Dubonnet, NP  blood glucose meter kit and supplies KIT Dispense based on patient and insurance preference. Use up to four times daily as directed. (FOR ICD-9 250.00, 250.01). 02/15/15   Rivet, Sindy Guadel[place], MD  cholecalciferol (VITAMIN D) 1000 UNITS tablet Take 2,000 Units by mouth daily.    [provider]  escitalopram (LEXAPRO) 10 MG tablet Take 10 mg daily by mouth.    [provider]  insulin glargine (LANTUS) 100 UNIT/ML injection Inject 0.15 mLs (15 Units total) into the skin at bedtime. Patient taking  differently: Inject 50 Units into the skin daily. Breakfast. 02/15/15   Rivet, Sindy Guadeloupe, MD  insulin lispro (HUMALOG) 100 UNIT/ML injection Inject 10 Units into the skin 3 (three) times daily before meals.     [provider]  memantine (NAMENDA) 5 MG tablet Take 5 mg 2 (two) times daily by mouth.    [provider]  metoprolol succinate (TOPROL-XL) 100 MG 24 hr tablet Take 100 mg by mouth daily. Take with or immediately following a meal.    [provider]  NOVOLOG 100 UNIT/ML injection INJECT 10 UNITS BEFORE [facility] MEAL 09/14/18   [provider]  Rivaroxaban (XARELTO) 20 MG TABS Take 1 tablet (20 mg total) by mouth daily. 08/24/12   Reynold Bowen, MD    Family History Family History  Problem Relation Age of Onset  . Diabetes Father   . Coronary artery disease Father   . Stroke Mother     Social History Social History   Tobacco Use  . Smoking status: Never Smoker  . Smokeless tobacco: Never Used  Substance Use Topics  . Alcohol use: No    Alcohol/week: 0.0 standard drinks  . Drug use: No     Allergies   Patient has no known allergies.   Review of Systems Review of Systems  Constitutional: Negative for appetite change and fatigue.  HENT: Negative for congestion, ear discharge and sinus pressure.   Eyes: Negative for discharge.  Respiratory: Negative for cough.   Cardiovascular: Negative for chest pain.  Gastrointestinal: Negative for abdominal pain and diarrhea.  Genitourinary: Negative for frequency and hematuria.  Musculoskeletal: Negative for back pain.       Right hip pain  Skin: Negative for rash.  Neurological: Negative for seizures and headaches.  Psychiatric/Behavioral: Negative for hallucinations.     Physical Exam Updated Vital Signs BP (!) 110/55   Pulse 75   Temp 98.4 F (36.9 C) (Oral)   Resp 20   SpO2 98%   Physical Exam Vitals signs and nursing note reviewed.  Constitutional:      Appearance: She is well-developed.  HENT:     Head: Normocephalic.     Nose: Nose normal.  Eyes:     General: No scleral icterus.    Conjunctiva/sclera: Conjunctivae normal.  Neck:     Musculoskeletal: Neck supple.     Thyroid: No thyromegaly.  Cardiovascular:     Rate and Rhythm: Normal rate and regular rhythm.     Heart sounds: No murmur. No friction rub. No gallop.   Pulmonary:     Breath sounds: No stridor. No wheezing or rales.  Chest:     Chest wall: No tenderness.  Abdominal:     General: There is no distension.     Tenderness: There is no abdominal tenderness. There is no rebound.  Musculoskeletal:     Comments: Deformed tender right hip  Lymphadenopathy:     Cervical: No  cervical adenopathy.  Skin:    Findings: No erythema or rash.  Neurological:     Mental Status: She is alert and oriented to person, place, and time.     Motor: No abnormal muscle tone.     Coordination: Coordination normal.  Psychiatric:        Behavior: Behavior normal.      ED Treatments / Results  Labs (all labs ordered are listed, but only abnormal results are displayed) Labs Reviewed  CBC WITH DIFFERENTIAL/PLATELET - Abnormal; Notable for the following components:  Result Value   WBC 17.8 (*)    RBC 3.85 (*)    Hemoglobin 10.9 (*)    MCHC 29.8 (*)    Neutro Abs 15.2 (*)    Abs Immature Granulocytes 0.11 (*)    All other components within normal limits  COMPREHENSIVE METABOLIC PANEL - Abnormal; Notable for the following components:   Potassium 5.3 (*)    CO2 21 (*)    Glucose, Bld 324 (*)    BUN 36 (*)    Creatinine, Ser 2.03 (*)    Albumin 3.1 (*)    GFR calc non Af Amer 24 (*)    GFR calc Af Amer 28 (*)    All other components within normal limits  SARS CORONAVIRUS 2 (TAT 6-24 HRS)    EKG None  Radiology Dg Chest 1 View  Result Date: 08/15/2019 CLINICAL DATA:  Fall, right hip fracture EXAM: CHEST  1 VIEW COMPARISON:  02/15/2015 FINDINGS: Heart and mediastinal contours are within normal limits. No focal opacities or effusions. No acute bony abnormality. IMPRESSION: No active disease. Electronically Signed   By: Rolm Baptise M.D.   On: 08/15/2019 20:33   Dg Pelvis 1-2 Views  Result Date: 08/15/2019 CLINICAL DATA:  Fall, right hip pain EXAM: PELVIS - 1-2 VIEW COMPARISON:  None. FINDINGS: There is a right femoral intertrochanteric fracture with varus angulation. Lesser trochanter fragment is displaced. No subluxation or dislocation. SI joints symmetric. Degenerative changes in the visualized lower lumbar spine. IMPRESSION: Right femoral intertrochanteric fracture with varus angulation. Electronically Signed   By: Rolm Baptise M.D.   On: 08/15/2019 20:29   Dg  Femur Min 2 Views Right  Result Date: 08/15/2019 CLINICAL DATA:  Fall, right hip pain EXAM: RIGHT FEMUR 2 VIEWS COMPARISON:  Pelvis images today FINDINGS: There is a comminuted right femoral intertrochanteric fracture with moderately displaced fracture fragments and varus angulation. No subluxation or dislocation. No additional acute bony abnormality. Advanced degenerative changes in the right knee. IMPRESSION: Comminuted right femoral intertrochanteric fracture with varus angulation. Electronically Signed   By: Rolm Baptise M.D.   On: 08/15/2019 20:29    Procedures Procedures (including critical care time)  Medications Ordered in ED Medications - No data to display   Initial Impression / Assessment and Plan / ED Course  I have reviewed the triage vital signs and the nursing notes.  Pertinent labs & imaging results that were available during my care of the patient were reviewed by me and considered in my medical decision making (see chart for details).       With a right intertrochanteric fracture.  I spoke with Dr. Doran Durand and he will see the patient and make sure surgery is arranged for repair of the right hip.  Medicine will admit. Final Clinical Impressions(s) / ED Diagnoses   Final diagnoses:  S/P right hip fracture    ED Discharge Orders    None       Milton Ferguson, MD 08/15/19 2237

## 2019-08-15 NOTE — H&P (Signed)
History and Physical    Jenny Glover:[id number] DOB: 08/04/1946 DOA: 08/15/2019  PCP: Reynold Bowen, MD  Patient coming from: Home.  Chief Complaint: Fall.  History provided by patient's husband.  HPI: Jenny Glover is a 73 y.o. female with history of A. fib, CAD, diabetes mellitus, hypertension, dementia was brought to the ER the patient had a fall at home but denies hitting her head also incontinence but denies any chest pain or shortness of breath after the fall had right hip pain and was brought to the ER.  ED Course: In the ER patient's x-ray showed right hip fracture and on-call orthopedic surgeon Dr. Doran Durand has been consulted.  Patient's last dose of Xarelto was this morning.  Patient's labs show acute renal failure with anemia with mild hyperkalemia.  EKG shows A. fib rate controlled.  COVID-19 test is negative.  Patient admitted for further management.  Review of Systems: As per HPI, rest all negative.   Past Medical History:  Diagnosis Date  . Anxiety   . Atrial fibrillation ([place])    s/p DCCV 2005, 2006. Placed on Tikosyn 2006 (negative adenosine cardiolite 03/2005, could not measure EF).  . Depression   . Diabetes mellitus without complication ([place])   . Diabetic foot ulcer ([place])   . High cholesterol   . History of cardioversion 2005/2006  . Hyperlipemia   . Hypertension   . Mental status change   . Neuropathy   . Obesity   . Skin cancer of face    treated with radiation    Past Surgical History:  Procedure Laterality Date  . LEFT HEART CATHETERIZATION WITH CORONARY ANGIOGRAM N/A 08/23/2012   Procedure: LEFT HEART CATHETERIZATION WITH CORONARY ANGIOGRAM;  Surgeon: Jettie Booze, MD;  Location: [facility] CATH LAB;  Service: Cardiovascular;  Laterality: N/A;  . PERCUTANEOUS CORONARY STENT INTERVENTION (PCI-S)  08/23/2012   Procedure: PERCUTANEOUS CORONARY STENT INTERVENTION (PCI-S);  Surgeon: Jettie Booze, MD;  Location: [facility] CATH LAB;  Service:  Cardiovascular;;  . SKIN CANCER EXCISION    . TENOTOMY       reports that she has never smoked. She has never used smokeless tobacco. She reports that she does not drink alcohol or use drugs.  No Known Allergies  Family History  Problem Relation Age of Onset  . Diabetes Father   . Coronary artery disease Father   . Stroke Mother     Prior to Admission medications   Medication Sig Start Date End Date Taking? Authorizing Provider  amLODipine (NORVASC) 5 MG tablet Take 5 mg by mouth daily.    [provider]  atorvastatin (LIPITOR) 80 MG tablet Take 1 tablet (80 mg total) by mouth daily at 6 PM. 08/15/19   Loel Dubonnet, NP  blood glucose meter kit and supplies KIT Dispense based on patient and insurance preference. Use up to four times daily as directed. (FOR ICD-9 250.00, 250.01). 02/15/15   Rivet, Sindy Guadeloupe, MD  cholecalciferol (VITAMIN D) 1000 UNITS tablet Take 2,000 Units by mouth daily.    [provider]  escitalopram (LEXAPRO) 10 MG tablet Take 10 mg daily by mouth.    [provider]  insulin glargine (LANTUS) 100 UNIT/ML injection Inject 0.15 mLs (15 Units total) into the skin at bedtime. Patient taking differently: Inject 50 Units into the skin daily. Breakfast. 02/15/15   Rivet, Sindy Guadeloupe, MD  insulin lispro (HUMALOG) 100 UNIT/ML injection Inject 10 Units into the skin 3 (three) times daily before meals.  [provider]  memantine (NAMENDA) 5 MG tablet Take 5 mg 2 (two) times daily by mouth.    [provider]  metoprolol succinate (TOPROL-XL) 100 MG 24 hr tablet Take 100 mg by mouth daily. Take with or immediately following a meal.    [provider]  NOVOLOG 100 UNIT/ML injection INJECT 10 UNITS BEFORE [facility] MEAL 09/14/18   [provider]  Rivaroxaban (XARELTO) 20 MG TABS Take 1 tablet (20 mg total) by mouth daily. 08/24/12   Reynold Bowen, MD    Physical Exam: Constitutional: Moderately built and nourished.  Vitals:   08/15/19 2100 08/15/19 2130 08/15/19 2235 08/15/19 2240  BP: (!) 113/48 (!) 110/55 103/61   Pulse: 79 75  79  Resp: 19 20 (!) 22 19  Temp:      TempSrc:      SpO2: 98% 98%  98%   Eyes: Anicteric no pallor. ENMT: No discharge from the ears eyes nose or mouth. Neck: No mass felt.  No neck rigidity. Respiratory: No rhonchi or crepitations. Cardiovascular: S1-S2 heard. Abdomen: Soft nontender bowel sound present. Musculoskeletal: Pain on my right hip. Skin: No rash. Neurologic: Alert awake oriented to name and place moves all extremities. Psychiatric: Has some difficulty recalling certain incidents.   Labs on Admission: I have personally reviewed following labs and imaging studies  CBC: Recent Labs  Lab 08/15/19 2120  WBC 17.8*  NEUTROABS 15.2*  HGB 10.9*  HCT 36.6  MCV 95.1  PLT 754   Basic Metabolic Panel: Recent Labs  Lab 08/15/19 2120  NA 138  K 5.3*  CL 105  CO2 21*  GLUCOSE 324*  BUN 36*  CREATININE 2.03*  CALCIUM 8.9   GFR: Estimated Creatinine Clearance: 30 mL/min (A) (by C-G formula based on SCr of 2.03 mg/dL (H)). Liver Function Tests: Recent Labs  Lab 08/15/19 2120  AST 22  ALT 12  ALKPHOS 105  BILITOT 0.6  PROT 7.0  ALBUMIN 3.1*   No results for input(s): LIPASE, AMYLASE in the last 168 hours. No results for input(s): AMMONIA in the last 168 hours. Coagulation Profile: No results for input(s): INR, PROTIME in the last 168 hours. Cardiac Enzymes: No results for input(s): CKTOTAL, CKMB, CKMBINDEX, TROPONINI in the last 168 hours. BNP (last 3 results) No results for input(s): PROBNP in the last 8760 hours. HbA1C: No results for input(s): HGBA1C in the last 72 hours. CBG: No results for input(s): GLUCAP in the last 168 hours. Lipid Profile: No results for input(s): CHOL, HDL, LDLCALC, TRIG, CHOLHDL, LDLDIRECT in the last 72 hours. Thyroid Function Tests: No results for input(s): TSH, T4TOTAL, FREET4, T3FREE, THYROIDAB in the  last 72 hours. Anemia Panel: No results for input(s): VITAMINB12, FOLATE, FERRITIN, TIBC, IRON, RETICCTPCT in the last 72 hours. Urine analysis:    Component Value Date/Time   COLORURINE YELLOW 02/15/2015 1341   APPEARANCEUR CLEAR 02/15/2015 1341   LABSPEC 1.030 02/15/2015 1341   PHURINE 5.5 02/15/2015 1341   GLUCOSEU >1000 (A) 02/15/2015 1341   HGBUR NEGATIVE 02/15/2015 1341   BILIRUBINUR NEGATIVE 02/15/2015 1341   KETONESUR 15 (A) 02/15/2015 1341   PROTEINUR NEGATIVE 02/15/2015 1341   UROBILINOGEN 0.2 02/15/2015 1341   NITRITE NEGATIVE 02/15/2015 1341   LEUKOCYTESUR NEGATIVE 02/15/2015 1341   Sepsis Labs: _0 (procalcitonin:4,lacticidven:4) )No results found for this or any previous visit (from the past 240 hour(s)).   Radiological Exams on Admission: Dg Chest 1 View  Result Date: 08/15/2019 CLINICAL DATA:  Fall, right hip fracture EXAM: CHEST  1 VIEW COMPARISON:  02/15/2015 FINDINGS: Heart and mediastinal contours are within normal limits. No focal opacities or effusions. No acute bony abnormality. IMPRESSION: No active disease. Electronically Signed   By: Rolm Baptise M.D.   On: 08/15/2019 20:33   Dg Pelvis 1-2 Views  Result Date: 08/15/2019 CLINICAL DATA:  Fall, right hip pain EXAM: PELVIS - 1-2 VIEW COMPARISON:  None. FINDINGS: There is a right femoral intertrochanteric fracture with varus angulation. Lesser trochanter fragment is displaced. No subluxation or dislocation. SI joints symmetric. Degenerative changes in the visualized lower lumbar spine. IMPRESSION: Right femoral intertrochanteric fracture with varus angulation. Electronically Signed   By: Rolm Baptise M.D.   On: 08/15/2019 20:29   Dg Femur Min 2 Views Right  Result Date: 08/15/2019 CLINICAL DATA:  Fall, right hip pain EXAM: RIGHT FEMUR 2 VIEWS COMPARISON:  Pelvis images today FINDINGS: There is a comminuted right femoral intertrochanteric fracture with moderately displaced fracture fragments and varus  angulation. No subluxation or dislocation. No additional acute bony abnormality. Advanced degenerative changes in the right knee. IMPRESSION: Comminuted right femoral intertrochanteric fracture with varus angulation. Electronically Signed   By: Rolm Baptise M.D.   On: 08/15/2019 20:29    EKG: Independently reviewed.  A. fib rate controlled.  Assessment/Plan Principal Problem:   Closed right hip fracture, initial encounter ([place]) Active Problems:   DM (diabetes mellitus) with peripheral vascular complication (HCC)   Hypertensive heart disease   Chronic atrial fibrillation   CAD (coronary artery disease)   Senile dementia, uncomplicated (HCC)   OSA on CPAP   Aortic stenosis   ARF (acute renal failure) ([place])    1. Right hip fracture status post mechanical fall -appreciate orthopedic consult.  We will keep patient n.p.o. past midnight in anticipation of surgery.  Patient last dose of Xarelto was around 10 AM this morning.  So surgery could likely be after 10 AM tomorrow. 2. A. fib holding Xarelto due to anticipation of surgery.  Family and patient aware.  Agreeable.  On Toprol for rate control. 3. Acute renal failure with mild hypokalemia cause not clear.  Will gently hydrate and follow metabolic panel.  If creatinine does not improve will check renal sonogram.  Follow UA. 4. Normocytic normochromic anemia could be from blood loss.  Follow CBC. 5. Moderate aortic stenosis per 2D echo done in 2019.  Presently and asymptomatic. 6. CAD denies any chest pain.  On Toprol.  Presently n.p.o. 7. Hypertension we will keep patient on Toprol and as needed IV hydralazine. 8. Dementia on Namenda to be continued after surgery. 9. Diabetes mellitus type 2 on Lantus.  Presently I kept patient on sliding scale coverage and will decide Lantus dose based on the CBG and resume full dose once patient starts eating.  COVID-19 test was negative.  Given the hip fracture patient will need close observation and  further management and will need more than 2 midnight stays in inpatient status.   DVT prophylaxis: SCDs.  Start Xarelto after surgery when okay with orthopedics. Code Status: Full code confirmed with patient husband. Family Communication: Patient husband. Disposition Plan: Most likely will need rehab. Consults called: Orthopedics. Admission status: Inpatient.   Rise Patience MD [facility] Pager [phone].  If 7PM-7AM, please contact night-coverage [email or web address] Password [facility]  08/15/2019, 11:58 PM

## 2019-08-15 NOTE — Patient Instructions (Signed)
Medication Instructions:  No medication changes today.   A refill of your Atorvastatin was sent.  *If you need a refill on your cardiac medications before your next appointment, please call your pharmacy*  Lab Work: None today.  Recommend labs 6 weeks after resuming Atorvastatin including lipid profile and liver function. If you have a follow up upcoming with Dr. Forde Dandy you may do with him. Otherwise stop by the office the 2nd week of January for labs. Please be fasting. You do not need an appointment.  If you have labs (blood work) drawn today and your tests are completely normal, you will receive your results only by: Marland Kitchen MyChart Message (if you have MyChart) OR . A paper copy in the mail If you have any lab test that is abnormal or we need to change your treatment, we will call you to review the results.  Testing/Procedures:  You had an EKG today.  Your physician has requested that you have an echocardiogram. Echocardiography is a painless test that uses sound waves to create images of your heart. It provides your doctor with information about the size and shape of your heart and how well your heart's chambers and valves are working. This procedure takes approximately one hour. There are no restrictions for this procedure.   Follow-Up: At [facility], you and your health needs are our priority.  As part of our continuing mission to provide you with exceptional heart care, we have created designated Provider Care Teams.  These Care Teams include your primary Cardiologist (physician) and Advanced Practice Providers (APPs -  Physician Assistants and Nurse Practitioners) who all work together to provide you with the care you need, when you need it.  Your next appointment:   6 month(s)  The format for your next appointment:   In Person  Provider:   Jenne Campus, MD  Other Instructions  IF the echocardiogram results have changed in the last year, we will schedule an in office  visit to discuss.    Aortic Valve Stenosis  Aortic valve stenosis is a narrowing of the aortic valve in the heart. The aortic valve opens and closes to regulate blood flow between the left side of the heart (left ventricle) and the artery that leads away from the heart (aorta). When the aortic valve becomes narrow, it is difficult for the heart to pump blood out to the body, which causes the heart to work harder. The extra work can weaken the heart muscle over time. Aortic valve stenosis can range from mild to severe. If it is not treated, it can become more severe over time and lead to heart failure. What are the causes? This condition may be caused by:  Buildup of calcium around and on the aortic valve. This can occur with aging. This is the most common cause of aortic valve stenosis.  A heart problem that developed in the womb (birth defect).  Rheumatic fever.  Radiation to the chest. What increases the risk? You may be more likely to develop this condition if:  You are older than age 63.  You were born with an abnormal bicuspid valve. What are the signs or symptoms? You may not have any symptoms until your condition becomes severe. It may take 10-20 years for mild or moderate aortic valve stenosis to become severe. Symptoms may include:  Shortness of breath. This may get worse during physical activity.  Feeling unusually weak and tired (fatigue).  Extreme discomfort in the chest, neck, or arm during  physical activity (angina).  A heartbeat that is irregular or faster than normal (palpitations).  Dizziness or fainting. This may happen when you get physically tired or after you take certain heart medicines, such as nitroglycerin. How is this diagnosed? This condition may be diagnosed with:  A physical exam.  Echocardiogram. This is a type of imaging test that uses sound waves (ultrasound) to make images of your heart. There are two kinds of this test that may be used. ?  Transthoracic echocardiogram (TTE). For this type, a wand-like tool (transducer) is moved over your chest to create ultrasound images that are recorded by a computer. ? Transesophageal echocardiogram (TEE). For this type, a flexible tube (probe) is inserted down the part of the body that moves food from your mouth to your stomach (esophagus). The heart and the esophagus are close to each other. Your health care provider will use the probe to take clear, detailed pictures of the heart.  Cardiac catheterization. For this procedure, a small, thin tube (catheter) is passed through a large vein in your neck, groin, or arm. The catheter is used to get information about arteries, structures, blood pressure, and oxygen levels in your heart.  Stress tests. These are tests that evaluate the blood supply to your heart and your heart's response to exercise. You may work with a health care provider who specializes in the heart (cardiologist) for diagnosis and treatment. How is this treated? Treatment depends on how severe your condition is and what your symptoms are. You will need to have your heart checked regularly to make sure that your condition is not getting worse or causing serious problems. Treatment may also include:  Surgery to replace your aortic valve. This is the most common treatment for aortic valve stenosis, and it is the only treatment to cure the condition. Several types of surgeries are available. The surgery may be done: ? Through a large incision over your heart (open-heart surgery). ? Through small incisions, using a flexible tube called a catheter (transcatheter aortic valve replacement, TAVR).  Medicines that help to keep your heart rate regular.  Medicines that thin your blood (anticoagulants) to prevent blood clots.  Antibiotic medicines to help prevent infection. If your condition is mild, you may only need regular follow-up visits for monitoring. Follow these instructions at home:  Lifestyle  Limit alcohol intake to no more than 1 drink a day for nonpregnant women and 2 drinks a day for men. One drink equals 12 oz of beer, 5 oz of wine, or 1 oz of hard liquor.  Do not use any products that contain nicotine or tobacco, such as cigarettes and e-cigarettes. If you need help quitting, ask your health care provider.  Work with your health care provider to manage your blood pressure and cholesterol.  Maintain a healthy weight. Eating and drinking   Eat a heart-healthy diet that includes plenty of fresh fruits and vegetables, whole grains, lean protein, and low-fat or nonfat dairy.  Limit how much caffeine you drink. Caffeine can affect your heart's rate and rhythm.  Avoid foods that are: ? High in salt (sodium), saturated fat, or sugar. ? Canned or highly processed. ? Fried.  Follow instructions from your health care provider about any other eating or drinking restrictions. Activity  Exercise regularly and return to your normal activities as told by your health care provider. Ask your health care provider what amount and type of physical activity is safe for you. ? If your aortic valve stenosis is  mild, you may only need to avoid very intense physical activity, such as heavy weight lifting. ? The more severe your aortic valve stenosis is, the more activities you may need to avoid. If you are taking blood thinners:  Before you take any medicines that contain aspirin or NSAIDs, talk with your health care provider. These medicines increase your risk for dangerous bleeding.  Take your medicine exactly as told, at the same time every day.  Avoid activities that could cause injury or bruising, and follow instructions about how to prevent falls.  Wear a medical alert bracelet or carry a card that lists what medicines you take. General instructions  Take over-the-counter and prescription medicines only as told by your health care provider.  If you were prescribed an  antibiotic, take it as told by your health care provider. Do not stop taking the antibiotic even if you start to feel better.  If you are a woman and you plan to become pregnant, talk with your health care provider before you become pregnant.  Before you have any type of medical or dental procedure or surgery, tell all health care providers that you have aortic valve stenosis. This may affect the treatment that you receive.  Keep all follow-up visits as told by your health care provider. This is important. Contact a health care provider if:  You have a fever. Get help right away if:  You develop any of the following symptoms: ? Chest pain. ? Chest tightness. ? Shortness of breath. ? Trouble breathing.  You feel light-headed.  You feel like you might faint.  Your heartbeat is irregular or faster than normal. These symptoms may represent a serious problem that is an emergency. Do not wait to see if the symptoms will go away. Get medical help right away. Call your local emergency services (911 in the [place] Do not drive yourself to the hospital. Summary  Aortic valve stenosis is a narrowing of the aortic valve in the heart. The aortic valve opens and closes to regulate blood flow between the left side of the heart (left ventricle) and the artery that leads away from the heart (aorta).  Aortic valve stenosis can range from mild to severe. If it is not treated, it can become more severe over time and lead to heart failure.  Treatment depends on how severe your condition is and what your symptoms are. You will need to have your heart checked regularly to make sure that your condition is not getting worse or causing serious problems.  Exercise regularly and return to your normal activities as told by your health care provider. Ask your health care provider what amount and type of physical activity is safe for you. This information is not intended to replace advice given to you by your  health care provider. Make sure you discuss any questions you have with your health care provider. Document Released: 05/30/2003 Document Revised: 08/13/2017 Document Reviewed: 06/03/2017 Elsevier Patient Education  2020 [employer].

## 2019-08-15 NOTE — ED Notes (Signed)
Right hip is deformed, 2+ right pedal pulse, cap refill less than 3 sec, pt able to wiggle right toes.

## 2019-08-15 NOTE — H&P (View-Only) (Signed)
Reason for Consult:  Right hip pain Referring Physician:  Dr. Gustavo Lah is an 73 y.o. female.  HPI:  73 y/o female with PMH of CAD, diabetes and a fib (Xarelto) fell this evening in the yard and landed on her R side.  She denies any LOC.  She c/o R hip pain that is moderate when trying to move but is not painful when she lies still.  She has a h/o R knee arthritis but has never had a broken hip before.  She is not a smoker.  She is accompanied by her husband who provides some history.  She last took Xarelto thjis morning.  Covid test pending.  Past Medical History:  Diagnosis Date  . Anxiety   . Atrial fibrillation ([place])    s/p DCCV 2005, 2006. Placed on Tikosyn 2006 (negative adenosine cardiolite 03/2005, could not measure EF).  . Depression   . Diabetes mellitus without complication ([place])   . Diabetic foot ulcer ([place])   . High cholesterol   . History of cardioversion 2005/2006  . Hyperlipemia   . Hypertension   . Mental status change   . Neuropathy   . Obesity   . Skin cancer of face    treated with radiation    Past Surgical History:  Procedure Laterality Date  . LEFT HEART CATHETERIZATION WITH CORONARY ANGIOGRAM N/A 08/23/2012   Procedure: LEFT HEART CATHETERIZATION WITH CORONARY ANGIOGRAM;  Surgeon: Jettie Booze, MD;  Location: [facility] CATH LAB;  Service: Cardiovascular;  Laterality: N/A;  . PERCUTANEOUS CORONARY STENT INTERVENTION (PCI-S)  08/23/2012   Procedure: PERCUTANEOUS CORONARY STENT INTERVENTION (PCI-S);  Surgeon: Jettie Booze, MD;  Location: [facility] CATH LAB;  Service: Cardiovascular;;  . SKIN CANCER EXCISION    . TENOTOMY      Family History  Problem Relation Age of Onset  . Diabetes Father   . Coronary artery disease Father   . Stroke Mother     Social History:  reports that she has never smoked. She has never used smokeless tobacco. She reports that she does not drink alcohol or use drugs.  Allergies: No Known Allergies  Medications: I  have reviewed the patient's current medications.  No results found for this or any previous visit (from the past 48 hour(s)).  Dg Chest 1 View  Result Date: 08/15/2019 CLINICAL DATA:  Fall, right hip fracture EXAM: CHEST  1 VIEW COMPARISON:  02/15/2015 FINDINGS: Heart and mediastinal contours are within normal limits. No focal opacities or effusions. No acute bony abnormality. IMPRESSION: No active disease. Electronically Signed   By: Rolm Baptise M.D.   On: 08/15/2019 20:33   Dg Pelvis 1-2 Views  Result Date: 08/15/2019 CLINICAL DATA:  Fall, right hip pain EXAM: PELVIS - 1-2 VIEW COMPARISON:  None. FINDINGS: There is a right femoral intertrochanteric fracture with varus angulation. Lesser trochanter fragment is displaced. No subluxation or dislocation. SI joints symmetric. Degenerative changes in the visualized lower lumbar spine. IMPRESSION: Right femoral intertrochanteric fracture with varus angulation. Electronically Signed   By: Rolm Baptise M.D.   On: 08/15/2019 20:29   Dg Femur Min 2 Views Right  Result Date: 08/15/2019 CLINICAL DATA:  Fall, right hip pain EXAM: RIGHT FEMUR 2 VIEWS COMPARISON:  Pelvis images today FINDINGS: There is a comminuted right femoral intertrochanteric fracture with moderately displaced fracture fragments and varus angulation. No subluxation or dislocation. No additional acute bony abnormality. Advanced degenerative changes in the right knee. IMPRESSION: Comminuted right femoral intertrochanteric fracture with  varus angulation. Electronically Signed   By: Rolm Baptise M.D.   On: 08/15/2019 20:29    ROS:  No recent f/c/n/v/wt loss PE:  Blood pressure (!) 132/55, pulse 81, temperature 98.4 F (36.9 C), temperature source Oral, resp. rate 17, SpO2 98 %. wn wd overweight woman in nad.  A and o x 4.  Mood and affec tnormal.  EOMI.  resp unlaboerd.  R LE shortened and externally rotated.  2+ dp pulse.  Feels LT dorsally at the foot.  No lymphadenopathy.  5/5 strength in  PF and DF of the ankle and toes.  Assessment/Plan: R hip intertrochanteric fracture - hold Xarelto.  Pt will need operative treatment of this displaced and unstable R hip fracture.  We'll plan surgery in the next day or so.  NPO after midnight.  Wylene Simmer 08/15/2019, 9:12 PM

## 2019-08-15 NOTE — Consult Note (Signed)
Reason for Consult:  Right hip pain Referring Physician:  Dr. Gustavo Lah is an 73 y.o. female.  HPI:  73 y/o female with PMH of CAD, diabetes and a fib (Xarelto) fell this evening in the yard and landed on her R side.  She denies any LOC.  She c/o R hip pain that is moderate when trying to move but is not painful when she lies still.  She has a h/o R knee arthritis but has never had a broken hip before.  She is not a smoker.  She is accompanied by her husband who provides some history.  She last took Xarelto thjis morning.  Covid test pending.  Past Medical History:  Diagnosis Date  . Anxiety   . Atrial fibrillation ([place])    s/p DCCV 2005, 2006. Placed on Tikosyn 2006 (negative adenosine cardiolite 03/2005, could not measure EF).  . Depression   . Diabetes mellitus without complication ([place])   . Diabetic foot ulcer ([place])   . High cholesterol   . History of cardioversion 2005/2006  . Hyperlipemia   . Hypertension   . Mental status change   . Neuropathy   . Obesity   . Skin cancer of face    treated with radiation    Past Surgical History:  Procedure Laterality Date  . LEFT HEART CATHETERIZATION WITH CORONARY ANGIOGRAM N/A 08/23/2012   Procedure: LEFT HEART CATHETERIZATION WITH CORONARY ANGIOGRAM;  Surgeon: Jettie Booze, MD;  Location: [facility] CATH LAB;  Service: Cardiovascular;  Laterality: N/A;  . PERCUTANEOUS CORONARY STENT INTERVENTION (PCI-S)  08/23/2012   Procedure: PERCUTANEOUS CORONARY STENT INTERVENTION (PCI-S);  Surgeon: Jettie Booze, MD;  Location: [facility] CATH LAB;  Service: Cardiovascular;;  . SKIN CANCER EXCISION    . TENOTOMY      Family History  Problem Relation Age of Onset  . Diabetes Father   . Coronary artery disease Father   . Stroke Mother     Social History:  reports that she has never smoked. She has never used smokeless tobacco. She reports that she does not drink alcohol or use drugs.  Allergies: No Known Allergies  Medications: I  have reviewed the patient's current medications.  No results found for this or any previous visit (from the past 48 hour(s)).  Dg Chest 1 View  Result Date: 08/15/2019 CLINICAL DATA:  Fall, right hip fracture EXAM: CHEST  1 VIEW COMPARISON:  02/15/2015 FINDINGS: Heart and mediastinal contours are within normal limits. No focal opacities or effusions. No acute bony abnormality. IMPRESSION: No active disease. Electronically Signed   By: Rolm Baptise M.D.   On: 08/15/2019 20:33   Dg Pelvis 1-2 Views  Result Date: 08/15/2019 CLINICAL DATA:  Fall, right hip pain EXAM: PELVIS - 1-2 VIEW COMPARISON:  None. FINDINGS: There is a right femoral intertrochanteric fracture with varus angulation. Lesser trochanter fragment is displaced. No subluxation or dislocation. SI joints symmetric. Degenerative changes in the visualized lower lumbar spine. IMPRESSION: Right femoral intertrochanteric fracture with varus angulation. Electronically Signed   By: Rolm Baptise M.D.   On: 08/15/2019 20:29   Dg Femur Min 2 Views Right  Result Date: 08/15/2019 CLINICAL DATA:  Fall, right hip pain EXAM: RIGHT FEMUR 2 VIEWS COMPARISON:  Pelvis images today FINDINGS: There is a comminuted right femoral intertrochanteric fracture with moderately displaced fracture fragments and varus angulation. No subluxation or dislocation. No additional acute bony abnormality. Advanced degenerative changes in the right knee. IMPRESSION: Comminuted right femoral intertrochanteric fracture with  varus angulation. Electronically Signed   By: Rolm Baptise M.D.   On: 08/15/2019 20:29    ROS:  No recent f/c/n/v/wt loss PE:  Blood pressure (!) 132/55, pulse 81, temperature 98.4 F (36.9 C), temperature source Oral, resp. rate 17, SpO2 98 %. wn wd overweight woman in nad.  A and o x 4.  Mood and affec tnormal.  EOMI.  resp unlaboerd.  R LE shortened and externally rotated.  2+ dp pulse.  Feels LT dorsally at the foot.  No lymphadenopathy.  5/5 strength in  PF and DF of the ankle and toes.  Assessment/Plan: R hip intertrochanteric fracture - hold Xarelto.  Pt will need operative treatment of this displaced and unstable R hip fracture.  We'll plan surgery in the next day or so.  NPO after midnight.  Wylene Simmer 08/15/2019, 9:12 PM

## 2019-08-15 NOTE — ED Triage Notes (Addendum)
Assisted to pull patient from car-unable to bear weight d/t fall prior to arrival and R leg pain. Hx dementia, accompanied by husband. Sts it hurts the worst in the groin area. Distal PMS intact.

## 2019-08-16 DIAGNOSIS — S72001A Fracture of unspecified part of neck of right femur, initial encounter for closed fracture: Secondary | ICD-10-CM | POA: Diagnosis not present

## 2019-08-16 LAB — CBC WITH DIFFERENTIAL/PLATELET
Abs Immature Granulocytes: 0.1 10*3/uL — ABNORMAL HIGH (ref 0.00–0.07)
Basophils Absolute: 0 10*3/uL (ref 0.0–0.1)
Basophils Relative: 0 %
Eosinophils Absolute: 0 10*3/uL (ref 0.0–0.5)
Eosinophils Relative: 0 %
HCT: 27 % — ABNORMAL LOW (ref 36.0–46.0)
Hemoglobin: 8.1 g/dL — ABNORMAL LOW (ref 12.0–15.0)
Immature Granulocytes: 1 %
Lymphocytes Relative: 16 %
Lymphs Abs: 2.1 10*3/uL (ref 0.7–4.0)
MCH: 28.2 pg (ref 26.0–34.0)
MCHC: 30 g/dL (ref 30.0–36.0)
MCV: 94.1 fL (ref 80.0–100.0)
Monocytes Absolute: 1.1 10*3/uL — ABNORMAL HIGH (ref 0.1–1.0)
Monocytes Relative: 8 %
Neutro Abs: 10 10*3/uL — ABNORMAL HIGH (ref 1.7–7.7)
Neutrophils Relative %: 75 %
Platelets: 288 10*3/uL (ref 150–400)
RBC: 2.87 MIL/uL — ABNORMAL LOW (ref 3.87–5.11)
RDW: 14.7 % (ref 11.5–15.5)
WBC: 13.3 10*3/uL — ABNORMAL HIGH (ref 4.0–10.5)
nRBC: 0 % (ref 0.0–0.2)

## 2019-08-16 LAB — BASIC METABOLIC PANEL
Anion gap: 12 (ref 5–15)
Anion gap: 9 (ref 5–15)
BUN: 50 mg/dL — ABNORMAL HIGH (ref 8–23)
BUN: 61 mg/dL — ABNORMAL HIGH (ref 8–23)
CO2: 20 mmol/L — ABNORMAL LOW (ref 22–32)
CO2: 19 mmol/L — ABNORMAL LOW (ref 22–32)
Calcium: 8.6 mg/dL — ABNORMAL LOW (ref 8.9–10.3)
Calcium: 8 mg/dL — ABNORMAL LOW (ref 8.9–10.3)
Chloride: 102 mmol/L (ref 98–111)
Chloride: 106 mmol/L (ref 98–111)
Creatinine, Ser: 3.47 mg/dL — ABNORMAL HIGH (ref 0.44–1.00)
Creatinine, Ser: 4.18 mg/dL — ABNORMAL HIGH (ref 0.44–1.00)
GFR calc Af Amer: 14 mL/min — ABNORMAL LOW (ref >60)
GFR calc Af Amer: 12 mL/min — ABNORMAL LOW (ref >60)
GFR calc non Af Amer: 12 mL/min — ABNORMAL LOW (ref >60)
GFR calc non Af Amer: 10 mL/min — ABNORMAL LOW (ref >60)
Glucose, Bld: 408 mg/dL — ABNORMAL HIGH (ref 70–99)
Glucose, Bld: 418 mg/dL — ABNORMAL HIGH (ref 70–99)
Potassium: 5.6 mmol/L — ABNORMAL HIGH (ref 3.5–5.1)
Potassium: 5.9 mmol/L — ABNORMAL HIGH (ref 3.5–5.1)
Sodium: 134 mmol/L — ABNORMAL LOW (ref 135–145)
Sodium: 134 mmol/L — ABNORMAL LOW (ref 135–145)

## 2019-08-16 LAB — CBG MONITORING, ED
Glucose-Capillary: 352 mg/dL — ABNORMAL HIGH (ref 70–99)
Glucose-Capillary: 360 mg/dL — ABNORMAL HIGH (ref 70–99)
Glucose-Capillary: 368 mg/dL — ABNORMAL HIGH (ref 70–99)
Glucose-Capillary: 372 mg/dL — ABNORMAL HIGH (ref 70–99)
Glucose-Capillary: 380 mg/dL — ABNORMAL HIGH (ref 70–99)
Glucose-Capillary: 419 mg/dL — ABNORMAL HIGH (ref 70–99)

## 2019-08-16 LAB — MRSA PCR SCREENING: MRSA by PCR: NEGATIVE

## 2019-08-16 LAB — ABO/RH: ABO/RH(D): B NEG

## 2019-08-16 LAB — GLUCOSE, CAPILLARY: Glucose-Capillary: 391 mg/dL — ABNORMAL HIGH (ref 70–99)

## 2019-08-16 LAB — SARS CORONAVIRUS 2 (TAT 6-24 HRS): SARS Coronavirus 2: NEGATIVE

## 2019-08-16 MED ORDER — POVIDONE-IODINE 10 % EX SWAB: 2.0000 "application " | Freq: Once | CUTANEOUS | Status: DC

## 2019-08-16 MED ORDER — CHLORHEXIDINE GLUCONATE 4 % EX LIQD
60.0000 mL | Freq: Once | CUTANEOUS | Status: AC
  Administered 2019-08-17: 4 via TOPICAL
  Filled 2019-08-16: qty 60

## 2019-08-16 MED ORDER — HYDRALAZINE HCL 20 MG/ML IJ SOLN: 10.0000 mg | INTRAMUSCULAR | Status: DC | PRN

## 2019-08-16 MED ORDER — CEFAZOLIN SODIUM-DEXTROSE 2-4 GM/100ML-% IV SOLN: 2.0000 g | INTRAVENOUS | Status: AC

## 2019-08-16 MED ORDER — METOPROLOL SUCCINATE ER 100 MG PO TB24
100.0000 mg | ORAL_TABLET | Freq: Every day | ORAL | Status: DC
  Administered 2019-08-17 – 2019-08-23 (×7): 100 mg via ORAL
  Filled 2019-08-16 (×8): qty 1

## 2019-08-16 MED ORDER — SODIUM CHLORIDE 0.9 % IV SOLN: INTRAVENOUS | Status: AC

## 2019-08-16 MED ORDER — ENSURE PRE-SURGERY PO LIQD
296.0000 mL | Freq: Once | ORAL | Status: AC
  Administered 2019-08-17: 296 mL via ORAL
  Filled 2019-08-16 (×2): qty 296

## 2019-08-16 MED ORDER — TRANEXAMIC ACID-NACL 1000-0.7 MG/100ML-% IV SOLN: 1000.0000 mg | INTRAVENOUS | Status: AC

## 2019-08-16 NOTE — Progress Notes (Signed)
PROGRESS NOTE    Patient: Jenny Glover                            PCP: Reynold Bowen, MD                    DOB: 05-May-1946            DOA: 08/15/2019 OVZ:[id number]             DOS: 08/16/2019, 8:16 AM   LOS: 1 day   Date of Service: The patient was seen and examined on 08/16/2019  Subjective:   Patient was seen and examined, remained stable in bed. Husband present at bedside. The patient and husband confirm that patient's fall was accidental. Number lost consciousness or hit her head.  Patient still expressing discomfort and pain with any movement in bed.  Brief Narrative:  Jenny Glover is a 73 y.o. female with history of A. fib, CAD, diabetes mellitus, hypertension, dementia was brought to the ER the patient had a fall at home but denies hitting her head also incontinence but denies any chest pain or shortness of breath after the fall had right hip pain and was brought to the ER.  ED Course: x-ray showed right hip fracture and on-call orthopedic surgeon Dr. Doran Durand has been consulted.  Patient's last dose of Xarelto was this morning.  Patient's labs show acute renal failure with anemia with mild hyperkalemia.  EKG shows A. fib rate controlled.  COVID-19 test is negative.  Patient admitted for further management.   Assessment & Plan:   Principal Problem:   Closed right hip fracture, initial encounter ([place]) Active Problems:   DM (diabetes mellitus) with peripheral vascular complication (HCC)   Hypertensive heart disease   Chronic atrial fibrillation   CAD (coronary artery disease)   Senile dementia, uncomplicated (HCC)   OSA on CPAP   Aortic stenosis   ARF (acute renal failure) (H[facility])    Right hip fracture status post mechanical fall  -Remained stable in bed, -N.p.o. -Orthopedic team consulted pending anticipated ORIF -  Patient last dose of Xarelto was around 10 AM in the morning of 08/15/2019.   -Cleared for surgery on 08/16/2019 at 10 AM.   A. fib  holding  Xarelto due to anticipation of surgery.  Family and patient aware.  Agreeable.  On Toprol for rate control.  Acute renal failure with mild hypokalemia  -Monitoring BUN/creatinine closely cause not clear.  Will gently hydrate and follow metabolic panel.  If creatinine does not improve will check renal sonogram.  Follow UA.   Acute on chronic normocytic normochromic anemia  -Monitoring H&H closely  Moderate aortic stenosis per 2D echo done in 2019.  Presently and asymptomatic.  CAD denies any chest pain.  On Toprol.  Presently n.p.o.   Hypertension we will keep patient on Toprol and as needed IV hydralazine.  Dementia on Namenda to be continued after surgery.   Diabetes mellitus II -on Lantus.  Presently I kept patient on sliding scale coverage and will decide Lantus dose based on the CBG and resume full dose once patient starts eating.  COVID-19 test was negative.     DVT prophylaxis: SCDs.  Start Xarelto after surgery when okay with orthopedics. Code Status: Full code confirmed with patient husband. Family Communication: Patient husband. Disposition Plan: Most likely will need rehab. Consults called: Orthopedics. Admission status: Inpatient. Given the hip fracture patient, acute on chronic anemia,  AKI, hyperkalemia is the criteria for admission likely greater than 2 midnight stay.    Echo 07/22/2018 Study Conclusions - Left ventricle: The cavity size was normal. Wall thickness was increased in a pattern of moderate LVH. Systolic function was normal. The estimated ejection fraction was in the range of 60% to 65%. Wall motion was normal; there were no regional wall motion abnormalities. - Aortic valve: There was moderate stenosis. Valve area (VTI): 0.65 cm^2. Valve area (Vmax): 0.61 cm^2. Valve area (Vmean): 0.65 cm^2. - Mitral valve: There was mild regurgitation. - Left atrium: The atrium was moderately dilated. - Right atrium: The atrium was  moderately dilated. - Pulmonary arteries: PA peak pressure: 56 mm Hg (S).    Procedures:   No admission procedures for hospital encounter.   Right hip for reduction internal fixation tentatively 12-20  Antimicrobials:  Anti-infectives (From admission, onward)   None       Medication:  . insulin aspart  0-9 Units Subcutaneous Q4H  . metoprolol succinate  100 mg Oral Daily    hydrALAZINE, morphine injection   Objective:   Vitals:   08/15/19 2130 08/15/19 2235 08/15/19 2240 08/16/19 0444  BP: (!) 110/55 103/61  (!) 106/58  Pulse: 75  79 84  Resp: 20 (!) 22 19 18   Temp:      TempSrc:      SpO2: 98%  98% 97%   No intake or output data in the 24 hours ending 08/16/19 0816 There were no vitals filed for this visit.   Examination:   Physical Exam  Constitution:  Alert, cooperative, no distress,  Appears calm and comfortable  Psychiatric: Normal and stable mood and affect, cognition intact,   HEENT: Normocephalic, PERRL, otherwise with in Normal limits  Chest:Chest symmetric Cardio vascular:  S1/S2, RRR, No murmure, No Rubs or Gallops  pulmonary: Clear to auscultation bilaterally, respirations unlabored, negative wheezes / crackles Abdomen: Soft, non-tender, non-distended, bowel sounds,no masses, no organomegaly Muscular skeletal:  Right hip pain with any movement -positive pulse Otherwise Limited exam - in bed, able to move all 4 extremities, Normal strength,  Neuro: CNII-XII intact. , normal motor and sensation, reflexes intact  Extremities: No pitting edema lower extremities, +2 pulses  Skin: Dry, warm to touch, negative for any Rashes, No open wounds Wounds: per nursing documentation     LABs:  CBC Latest Ref Rng & Units 08/15/2019 02/15/2015 08/23/2012  WBC 4.0 - 10.5 K/uL 17.8(H) 10.1 10.1  Hemoglobin 12.0 - 15.0 g/dL 10.9(L) 13.5 13.0  Hematocrit 36.0 - 46.0 % 36.6 40.9 40.0  Platelets 150 - 400 K/uL 342 216 236   CMP Latest Ref Rng & Units 08/15/2019  02/15/2015 08/24/2012  Glucose 70 - 99 mg/dL 324(H) 794(HH) 201(H)  BUN 8 - 23 mg/dL 36(H) 22(H) 20  Creatinine 0.44 - 1.00 mg/dL 2.03(H) 1.59(H) 1.12(H)  Sodium 135 - 145 mmol/L 138 126(L) 137  Potassium 3.5 - 5.1 mmol/L 5.3(H) 4.4 4.0  Chloride 98 - 111 mmol/L 105 89(L) 101  CO2 22 - 32 mmol/L 21(L) 23 26  Calcium 8.9 - 10.3 mg/dL 8.9 9.0 9.2  Total Protein 6.5 - 8.1 g/dL 7.0 - -  Total Bilirubin 0.3 - 1.2 mg/dL 0.6 - -  Alkaline Phos 38 - 126 U/L 105 - -  AST 15 - 41 U/L 22 - -  ALT 0 - 44 U/L 12 - -        SIGNED: Deatra James, MD, FACP, FHM. [facility],  Pager [phone]  If 7PM-7AM, please contact night-coverage [email or web address], Password [facility] 08/16/2019, 8:16 AM

## 2019-08-16 NOTE — Progress Notes (Signed)
Subjective: 73 y/o female with right hip IT fracture.  On Xarelto - last dose yesterday morning.    Objective: Vital signs in last 24 hours: Temp:  [98.4 F (36.9 C)] 98.4 F (36.9 C) (12/01 1850) Pulse Rate:  [75-84] 84 (12/02 0444) Resp:  [16-24] 24 (12/02 1115) BP: (103-133)/(48-84) 106/58 (12/02 0444) SpO2:  [95 %-98 %] 97 % (12/02 0444)  Intake/Output from previous day: No intake/output data recorded. Intake/Output this shift: Total I/O In: 600 [I.V.:600] Out: -   Recent Labs    08/15/19 2120 08/16/19 1045  HGB 10.9* 8.1*   Recent Labs    08/15/19 2120 08/16/19 1045  WBC 17.8* 13.3*  RBC 3.85* 2.87*  HCT 36.6 27.0*  PLT 342 288   Recent Labs    08/15/19 2120 08/16/19 1045  NA 138 134*  K 5.3* 5.6*  CL 105 102  CO2 21* 20*  BUN 36* 50*  CREATININE 2.03* 3.47*  GLUCOSE 324* 408*  CALCIUM 8.9 8.6*   No results for input(s): LABPT, INR in the last 72 hours.      Assessment/Plan: Right hip intertroch fracture - to OR tomorrow with Dr. Lyla Glassing for IM nailing.  Hold blood thinners.  NPO after midnight.     Wylene Simmer 08/16/2019, 2:30 PM

## 2019-08-16 NOTE — ED Notes (Signed)
CBG 324  Husband at bedside.  Pt restful at this time.

## 2019-08-16 NOTE — ED Notes (Signed)
  CBG 372  

## 2019-08-16 NOTE — ED Notes (Signed)
Husband Timmothy Sours:  [phone]  CELL: [phone]

## 2019-08-16 NOTE — Progress Notes (Signed)
NEW ADMISSION NOTE New Admission Note:   Arrival Method: stretcher from ED  Mental Orientation: alert and oriented to self and place.  Telemetry:Box 10  Assessment: Completed Skin: see skin assessment  IV: Right wrist  Pain:denies  Tubes: Safety Measures: Safety Fall Prevention Plan has been discussed Admission: To Be Completed [facility] Orientation: Patient has been orientated to the room, unit and staff.  Family: none at the bedside   Orders have been reviewed and implemented. Will continue to monitor the patient. Call light has been placed within reach and bed alarm has been activated.   Paulla Fore, RN

## 2019-08-16 NOTE — Progress Notes (Signed)
Inpatient Diabetes Program Recommendations  AACE/ADA: New Consensus Statement on Inpatient Glycemic Control (2015)  Target Ranges:  Prepandial:   less than 140 mg/dL      Peak postprandial:   less than 180 mg/dL (1-2 hours)      Critically ill patients:  140 - 180 mg/dL   Lab Results  Component Value Date   GLUCAP 368 (H) 08/16/2019    Review of Glycemic Control Results for BRITTYN, SALAZ (MRN [id number]) as of 08/16/2019 10:20  Ref. Range 08/16/2019 00:22 08/16/2019 04:35 08/16/2019 06:50 08/16/2019 09:50  Glucose-Capillary Latest Ref Range: 70 - 99 mg/dL 380 (H) 419 (H) 360 (H) 368 (H)   Diabetes history: DM2 Outpatient Diabetes medications: Lantus 50 units qd + Humalog 10 units tid Current orders for Inpatient glycemic control: Novolog sensitive correction q 4 hrs.  Inpatient Diabetes Program Recommendations:   -Add Lantus 50 units daily (patient's home dose) due to patient's CBGs >300.  Thank you, Nani Gasser. Jaydah Stahle, RN, MSN, CDE  Diabetes Coordinator Inpatient Glycemic Control Team Team Pager [phone] (8am-5pm) 08/16/2019 10:24 AM

## 2019-08-16 NOTE — ED Notes (Signed)
Pt restful at this time, sleeping.  Resp deep and unlabored.  IV infusing.

## 2019-08-16 NOTE — Progress Notes (Signed)
Notified MD Hal Hope via secure chat that k=5.6. Oncoming RN notified to follow up.   Paulla Fore, RN, BSN

## 2019-08-16 NOTE — Progress Notes (Signed)
Page attending doctor [facility] tonight about pt potassium being high since morning, Pt calm and resting on the bed, got a call back but no new orders yet, will continue to monitor.

## 2019-08-16 NOTE — ED Notes (Signed)
Attempted to collect pt's blood. No success.

## 2019-08-16 NOTE — ED Notes (Signed)
Remains restful without pain responses noted.  Husband at bedside with questions about POC.  Updated him on the admission process and holding for bed placement.  Msg to MD for change in diet orders if surgery tomorrow.

## 2019-08-16 NOTE — ED Notes (Signed)
Pt drowsy at this time after lunch.   Ate well and denies any further needs at this time.

## 2019-08-17 ENCOUNTER — Inpatient Hospital Stay (HOSPITAL_COMMUNITY): Payer: Medicare HMO

## 2019-08-17 ENCOUNTER — Encounter (HOSPITAL_COMMUNITY): Payer: Self-pay | Admitting: Certified Registered"

## 2019-08-17 ENCOUNTER — Encounter (HOSPITAL_COMMUNITY)
Admission: EM | Disposition: A | Discharge status: Skilled Nursing Facility | Payer: Self-pay | Source: Home / Self Care | Attending: Family Medicine

## 2019-08-17 DIAGNOSIS — L8990 Pressure ulcer of unspecified site, unspecified stage: Secondary | ICD-10-CM | POA: Insufficient documentation

## 2019-08-17 DIAGNOSIS — S72001A Fracture of unspecified part of neck of right femur, initial encounter for closed fracture: Secondary | ICD-10-CM | POA: Diagnosis not present

## 2019-08-17 LAB — GLUCOSE, CAPILLARY
Glucose-Capillary: 312 mg/dL — ABNORMAL HIGH (ref 70–99)
Glucose-Capillary: 357 mg/dL — ABNORMAL HIGH (ref 70–99)
Glucose-Capillary: 361 mg/dL — ABNORMAL HIGH (ref 70–99)
Glucose-Capillary: 365 mg/dL — ABNORMAL HIGH (ref 70–99)
Glucose-Capillary: 373 mg/dL — ABNORMAL HIGH (ref 70–99)
Glucose-Capillary: 378 mg/dL — ABNORMAL HIGH (ref 70–99)
Glucose-Capillary: 383 mg/dL — ABNORMAL HIGH (ref 70–99)

## 2019-08-17 LAB — HEMOGLOBIN AND HEMATOCRIT, BLOOD
HCT: 23.7 % — ABNORMAL LOW (ref 36.0–46.0)
Hemoglobin: 7.7 g/dL — ABNORMAL LOW (ref 12.0–15.0)

## 2019-08-17 LAB — URINALYSIS, COMPLETE (UACMP) WITH MICROSCOPIC
Bilirubin Urine: NEGATIVE
Glucose, UA: 150 mg/dL — AB
Ketones, ur: NEGATIVE mg/dL
Nitrite: NEGATIVE
Protein, ur: 30 mg/dL — AB
Specific Gravity, Urine: 1.021 (ref 1.005–1.030)
pH: 5 (ref 5.0–8.0)

## 2019-08-17 LAB — CBC WITH DIFFERENTIAL/PLATELET
Abs Immature Granulocytes: 0.12 10*3/uL — ABNORMAL HIGH (ref 0.00–0.07)
Basophils Absolute: 0.1 10*3/uL (ref 0.0–0.1)
Basophils Relative: 0 %
Eosinophils Absolute: 0 10*3/uL (ref 0.0–0.5)
Eosinophils Relative: 0 %
HCT: 21 % — ABNORMAL LOW (ref 36.0–46.0)
Hemoglobin: 6.5 g/dL — CL (ref 12.0–15.0)
Immature Granulocytes: 1 %
Lymphocytes Relative: 13 %
Lymphs Abs: 2.2 10*3/uL (ref 0.7–4.0)
MCH: 28.8 pg (ref 26.0–34.0)
MCHC: 31 g/dL (ref 30.0–36.0)
MCV: 92.9 fL (ref 80.0–100.0)
Monocytes Absolute: 2 10*3/uL — ABNORMAL HIGH (ref 0.1–1.0)
Monocytes Relative: 12 %
Neutro Abs: 12.5 10*3/uL — ABNORMAL HIGH (ref 1.7–7.7)
Neutrophils Relative %: 74 %
Platelets: 275 10*3/uL (ref 150–400)
RBC: 2.26 MIL/uL — ABNORMAL LOW (ref 3.87–5.11)
RDW: 14.9 % (ref 11.5–15.5)
WBC: 16.9 10*3/uL — ABNORMAL HIGH (ref 4.0–10.5)
nRBC: 0 % (ref 0.0–0.2)

## 2019-08-17 LAB — BASIC METABOLIC PANEL
Anion gap: 11 (ref 5–15)
BUN: 66 mg/dL — ABNORMAL HIGH (ref 8–23)
CO2: 20 mmol/L — ABNORMAL LOW (ref 22–32)
Calcium: 8.7 mg/dL — ABNORMAL LOW (ref 8.9–10.3)
Chloride: 105 mmol/L (ref 98–111)
Creatinine, Ser: 3.68 mg/dL — ABNORMAL HIGH (ref 0.44–1.00)
GFR calc Af Amer: 13 mL/min — ABNORMAL LOW (ref >60)
GFR calc non Af Amer: 12 mL/min — ABNORMAL LOW (ref >60)
Glucose, Bld: 352 mg/dL — ABNORMAL HIGH (ref 70–99)
Potassium: 4.8 mmol/L (ref 3.5–5.1)
Sodium: 136 mmol/L (ref 135–145)

## 2019-08-17 LAB — PROTIME-INR
INR: 1.5 — ABNORMAL HIGH (ref 0.8–1.2)
Prothrombin Time: 18 seconds — ABNORMAL HIGH (ref 11.4–15.2)

## 2019-08-17 LAB — SODIUM, URINE, RANDOM: Sodium, Ur: 33 mmol/L

## 2019-08-17 LAB — CREATININE, URINE, RANDOM: Creatinine, Urine: 270.23 mg/dL

## 2019-08-17 LAB — PREPARE RBC (CROSSMATCH)

## 2019-08-17 SURGERY — FIXATION, FRACTURE, INTERTROCHANTERIC, WITH INTRAMEDULLARY ROD
Anesthesia: Choice | Laterality: Right

## 2019-08-17 MED ORDER — DEXTROSE 50 % IV SOLN: 1.0000 | Freq: Once | INTRAVENOUS | Status: DC

## 2019-08-17 MED ORDER — ADULT MULTIVITAMIN W/MINERALS CH
1.0000 | ORAL_TABLET | Freq: Every day | ORAL | Status: DC
  Administered 2019-08-17 – 2019-08-23 (×6): 1 via ORAL
  Filled 2019-08-17 (×6): qty 1

## 2019-08-17 MED ORDER — FUROSEMIDE 10 MG/ML IJ SOLN
40.0000 mg | Freq: Once | INTRAMUSCULAR | Status: AC
  Administered 2019-08-17: 40 mg via INTRAVENOUS
  Filled 2019-08-17: qty 4

## 2019-08-17 MED ORDER — SODIUM ZIRCONIUM CYCLOSILICATE 10 G PO PACK
10.0000 g | PACK | Freq: Two times a day (BID) | ORAL | Status: DC
  Administered 2019-08-17: 10 g via ORAL
  Filled 2019-08-17: qty 1

## 2019-08-17 MED ORDER — GLUCERNA SHAKE PO LIQD
237.0000 mL | Freq: Three times a day (TID) | ORAL | Status: DC
  Administered 2019-08-17 – 2019-08-23 (×16): 237 mL via ORAL

## 2019-08-17 MED ORDER — DEXTROSE 5 % IV SOLN
INTRAVENOUS | Status: DC
  Administered 2019-08-17: 08:00:00 via INTRAVENOUS

## 2019-08-17 MED ORDER — SODIUM CHLORIDE 0.9 % IV SOLN
INTRAVENOUS | Status: AC
  Administered 2019-08-17: 08:00:00 via INTRAVENOUS

## 2019-08-17 MED ORDER — SODIUM CHLORIDE 0.9% IV SOLUTION
Freq: Once | INTRAVENOUS | Status: AC
  Administered 2019-08-17: 12:00:00 via INTRAVENOUS

## 2019-08-17 MED ORDER — INSULIN GLARGINE 100 UNIT/ML ~~LOC~~ SOLN
25.0000 [IU] | Freq: Two times a day (BID) | SUBCUTANEOUS | Status: DC
  Administered 2019-08-17 – 2019-08-20 (×6): 25 [IU] via SUBCUTANEOUS
  Filled 2019-08-17 (×9): qty 0.25

## 2019-08-17 NOTE — Plan of Care (Signed)

## 2019-08-17 NOTE — Progress Notes (Addendum)
PROGRESS NOTE    Patient: Jenny Glover                            PCP: Reynold Bowen, MD                    DOB: 1945-12-15            DOA: 08/15/2019 PYK:[id number]             DOS: 08/17/2019, 11:17 AM   LOS: 2 days   Date of Service: The patient was seen and examined on 08/17/2019  Subjective:   The patient was seen and examined this morning, remained stable. Reporting her hip pain is getting worse. Otherwise stable denies any chest pain or shortness of breath. N.p.o. overnight  Addendum: Repeat labs revealing hemoglobin of 6.5/WBC of 16.9  We are recommending no surgery today... Due to progressive kidney failure, hypotension, acute anemia -likely due to bleeding hematoma at the fracture site. A nontraumatic   The patient was informed of her worsening kidney function, anemia. Is agreed to blood transfusion.  Pros and cons were discussed in detail.   Brief Narrative:  Jenny Glover is a 73 y.o. female with history of A. fib, CAD, diabetes mellitus, hypertension, dementia was brought to the ER the patient had a fall at home but denies hitting her head also incontinence but denies any chest pain or shortness of breath after the fall had right hip pain and was brought to the ER.  ED Course: x-ray showed right hip fracture and on-call orthopedic surgeon Dr. Doran Durand has been consulted.  Patient's last dose of Xarelto was this morning.  Patient's labs show acute renal failure with anemia with mild hyperkalemia.  EKG shows A. fib rate controlled.  COVID-19 test is negative.  Patient admitted for further management.   Assessment & Plan:   Principal Problem:   Closed right hip fracture, initial encounter ([place]) Active Problems:   DM (diabetes mellitus) with peripheral vascular complication (HCC)   Hypertensive heart disease   Chronic atrial fibrillation   CAD (coronary artery disease)   Senile dementia, uncomplicated (HCC)   OSA on CPAP   Aortic stenosis   ARF (acute renal  failure) (HCC)   Pressure injury of skin    Right hip fracture status post mechanical fall  -Remained stable in bed, complaining of worsening hip pain. -N.p.o. overnight in anticipation of surgery today 08/17/2019 -Orthopedic team consulted pending anticipated ORIF -  Patient last dose of Xarelto was around 10 AM in the morning of 08/15/2019.   -Cleared for surgery on 08/16/2019 at 10 AM.  -Recommending withholding surgery for today due to worsening kidney function, severe anemia    A. fib  holding Xarelto due to anticipation of surgery.  Family and patient aware.  Agreeable.   On Toprol for rate control.  Acute renal sufficiency with hyperkalemia -Worsening BUN/creatinine likely due to episodes of hypotension, anemia -Monitoring BUN/creatinine closely... Elevated BUN/creatinine from baseline - Cr.  3.8--> 4.18 -Per patient had a history of renal sufficiency in the past, -Current etiology unknown, not on any nephrotoxins did not receive any dye -We will continue IV fluid hydration -We will avoid all nephrotoxins -Consulting nephrology  -Patient would receive D5 normal saline, insulin Repeating labs before surgery today  Acute on chronic normocytic normochromic anemia -likely worsening due to right hip hematoma secondary to fracture Hemoglobin 8.1 >>> 6.5  Will pursue with 2  units of PRBC transfusion -Monitoring H&H closely  Moderate aortic stenosis per 2D echo done in 2019.  Presently and asymptomatic.  CAD denies any chest pain.  On Toprol.  Presently n.p.o.  Hypertension -brief episode of hypotension overnight due to anemia hypovolemia.  Continue IV fluid hydration we will keep patient on Toprol and as needed IV hydralazine.  Dementia on Namenda to be continued after surgery.   Diabetes mellitus II -on Lantus.  Presently I kept patient on sliding scale coverage and will decide Lantus dose based on the CBG and resume full dose once patient starts eating.  COVID-19  test was negative.     DVT prophylaxis: SCDs.  Start Xarelto after surgery when okay with orthopedics. Code Status: Full code confirmed with patient husband. Family Communication: Patient husband. Disposition Plan: Most likely will need rehab. Consults called: Orthopedics. Admission status: Inpatient. Given the hip fracture patient, acute on chronic anemia, AKI, hyperkalemia is the criteria for admission likely greater than 2 midnight stay.    Echo 07/22/2018 Study Conclusions - Left ventricle: The cavity size was normal. Wall thickness was increased in a pattern of moderate LVH. Systolic function was normal. The estimated ejection fraction was in the range of 60% to 65%. Wall motion was normal; there were no regional wall motion abnormalities. - Aortic valve: There was moderate stenosis. Valve area (VTI): 0.65 cm^2. Valve area (Vmax): 0.61 cm^2. Valve area (Vmean): 0.65 cm^2. - Mitral valve: There was mild regurgitation. - Left atrium: The atrium was moderately dilated. - Right atrium: The atrium was moderately dilated. - Pulmonary arteries: PA peak pressure: 56 mm Hg (S).    Procedures:   No admission procedures for hospital encounter.   Right hip for reduction internal fixation tentatively 12-20  Antimicrobials:  Anti-infectives (From admission, onward)   Start     Dose/Rate Route Frequency Ordered Stop   08/17/19 0600  ceFAZolin (ANCEF) IVPB 2g/100 mL premix     2 g 200 mL/hr over 30 Minutes Intravenous On call to O.R. 08/16/19 2002 08/18/19 0559       Medication:  . insulin aspart  0-9 Units Subcutaneous Q4H  . metoprolol succinate  100 mg Oral Daily  . povidone-iodine  2 application Topical Once    hydrALAZINE, morphine injection   Objective:   Vitals:   08/16/19 1951 08/17/19 0038 08/17/19 0300 08/17/19 0919  BP: (!) 138/125 (!) 105/57 (!) 132/55 (!) 120/58  Pulse: 75 70 74 79  Resp:  16 15 17   Temp: 98.2 F (36.8 C) 98.5 F  (36.9 C) 98.4 F (36.9 C) 99.5 F (37.5 C)  TempSrc: Oral Oral Oral Oral  SpO2: 93% 96% 94% 95%    Intake/Output Summary (Last 24 hours) at 08/17/2019 1117 Last data filed at 08/16/2019 1717 Gross per 24 hour  Intake 1200 ml  Output -  Net 1200 ml   There were no vitals filed for this visit.   Examination:     BP (!) 120/58 (BP Location: Left Arm)   Pulse 79   Temp 99.5 F (37.5 C) (Oral)   Resp 17   SpO2 95%    Physical Exam  Constitution:  Alert, cooperative, no distress,  Psychiatric: Normal and stable mood and affect, cognition intact,   HEENT: Normocephalic, PERRL, otherwise with in Normal limits  Chest:Chest symmetric Cardio vascular:  S1/S2, RRR, No murmure, No Rubs or Gallops  pulmonary: Clear to auscultation bilaterally, respirations unlabored, negative wheezes / crackles Abdomen: Soft, non-tender, non-distended, bowel sounds,no masses, no  organomegaly Muscular skeletal:  Right hip pain, limited range of motion due to pain Limited exam - in bed, able to move all 4 extremities, Normal strength,  Neuro: CNII-XII intact. , normal motor and sensation, reflexes intact  Extremities: No pitting edema lower extremities, +2 pulses  Skin: Dry, warm to touch, negative for any Rashes, No open wounds Wounds: per nursing documentation Pressure Injury 08/16/19 Buttocks Left Stage II -  Partial thickness loss of dermis presenting as a shallow open ulcer with a red, pink wound bed without slough. (Active)  08/16/19 1805  Location: Buttocks  Location Orientation: Left  Staging: Stage II -  Partial thickness loss of dermis presenting as a shallow open ulcer with a red, pink wound bed without slough.  Wound Description (Comments):   Present on Admission: Yes      LABs:  CBC Latest Ref Rng & Units 08/16/2019 08/15/2019 02/15/2015  WBC 4.0 - 10.5 K/uL 13.3(H) 17.8(H) 10.1  Hemoglobin 12.0 - 15.0 g/dL 8.1(L) 10.9(L) 13.5  Hematocrit 36.0 - 46.0 % 27.0(L) 36.6 40.9  Platelets 150  - 400 K/uL 288 342 216   CMP Latest Ref Rng & Units 08/16/2019 08/16/2019 08/15/2019  Glucose 70 - 99 mg/dL 418(H) 408(H) 324(H)  BUN 8 - 23 mg/dL 61(H) 50(H) 36(H)  Creatinine 0.44 - 1.00 mg/dL 4.18(H) 3.47(H) 2.03(H)  Sodium 135 - 145 mmol/L 134(L) 134(L) 138  Potassium 3.5 - 5.1 mmol/L 5.9(H) 5.6(H) 5.3(H)  Chloride 98 - 111 mmol/L 106 102 105  CO2 22 - 32 mmol/L 19(L) 20(L) 21(L)  Calcium 8.9 - 10.3 mg/dL 8.0(L) 8.6(L) 8.9  Total Protein 6.5 - 8.1 g/dL - - 7.0  Total Bilirubin 0.3 - 1.2 mg/dL - - 0.6  Alkaline Phos 38 - 126 U/L - - 105  AST 15 - 41 U/L - - 22  ALT 0 - 44 U/L - - 12        SIGNED: Deatra James, MD, FACP, FHM. [facility],  Pager [phone]  If 7PM-7AM, please contact night-coverage [email or web address] Ota [facility] 08/17/2019, 11:17 AM

## 2019-08-17 NOTE — Progress Notes (Signed)
Pt completed 2nd unit of PRBC transfusion, no adverse reactions noted, vital signs taken and recorded.

## 2019-08-17 NOTE — Consult Note (Addendum)
--- STUDENT NOTE ---  Reason for Consult: hyperkalemia, AKI Referring Physician: [name] is an 73 y.o. female.  HPI: Jenny Glover has a past medical history of CAD, HTN, Afib, aortic stenosis, DMII, CKDIII, OSA, and dementia who presented to the emergency room 12/1 with right hip pain and was found to have a Rt intertrochanteric fracture s/p fall at home and was admitted for further management. She has been seen by ortho with plan for inpatient surgery. During her hospitalization, she became hypotensive 12/2 with a BP of 79/70. Her potassium increased from 5.3 to 5.9, urea 36 to 61 and her urine output decreased. See increased creatinine trends below. Additionally, her hemoglobin decreased from 10.1 to 8.1. Nephrology was consulted for hyperkalemia and acute kidney injury reflected. Her verbal history is not reliable due to her dementia.    Jenny Glover presented to [facility] ED after mechanical fall and had right hip pain.  In the ED she was noted to have sustained a right intertrochanteric fx and was admitted for Orthopedic surgical repair.  During her hospitalization she developed hypotension with BP of 79/70 and a significant drop in her Hgb from 10.1 to 6.5.  She also developed hyperkalemia and a rising BUN/Cr since admission.  We were consulted to further evaluate and manage her AKI/CKD stage 4.  Her exact baseline Cr is not know but was elevated at 1.65 in 2016 and was 2.03 at time of admission.  The trend in Scr is seen below.  Her hyperkalemia was treated with D5 and insulin alone.  Given her dementia, she was not able to participate in the history so existing medical literature was used.    Trend in Creatinine: Creatinine, Ser  Date/Time Value Ref Range Status  12/03/20202 1100 AM 3.68 0.44 - 1.00 mg/dL Final  08/16/2019 09:22 PM 4.18 (H) 0.44 - 1.00 mg/dL Final  08/16/2019 10:45 AM 3.47 (H) 0.44 - 1.00 mg/dL Final  08/15/2019 09:20 PM 2.03 (H) 0.44 - 1.00 mg/dL Final   02/15/2015 12:02 PM 1.59 (H) 0.44 - 1.00 mg/dL Final    PMH:   Past Medical History:  Diagnosis Date  . Anxiety   . Atrial fibrillation ([place])    s/p DCCV 2005, 2006. Placed on Tikosyn 2006 (negative adenosine cardiolite 03/2005, could not measure EF).  . Depression   . Diabetes mellitus without complication ([place])   . Diabetic foot ulcer ([place])   . High cholesterol   . History of cardioversion 2005/2006  . Hyperlipemia   . Hypertension   . Mental status change   . Neuropathy   . Obesity   . Skin cancer of face    treated with radiation    PSH:   Past Surgical History:  Procedure Laterality Date  . LEFT HEART CATHETERIZATION WITH CORONARY ANGIOGRAM N/A 08/23/2012   Procedure: LEFT HEART CATHETERIZATION WITH CORONARY ANGIOGRAM;  Surgeon: Jettie Booze, MD;  Location: [facility] CATH LAB;  Service: Cardiovascular;  Laterality: N/A;  . PERCUTANEOUS CORONARY STENT INTERVENTION (PCI-S)  08/23/2012   Procedure: PERCUTANEOUS CORONARY STENT INTERVENTION (PCI-S);  Surgeon: Jettie Booze, MD;  Location: [facility] CATH LAB;  Service: Cardiovascular;;  . SKIN CANCER EXCISION    . TENOTOMY      Allergies: No Known Allergies  Medications:   Prior to Admission medications   Medication Sig Start Date End Date Taking? Authorizing Provider  amLODipine (NORVASC) 5 MG tablet Take 5 mg by mouth daily.   Yes [provider]  atorvastatin (  LIPITOR) 80 MG tablet Take 1 tablet (80 mg total) by mouth daily at 6 PM. 08/15/19  Yes Loel Dubonnet, NP  cholecalciferol (VITAMIN D) 1000 UNITS tablet Take 2,000 Units by mouth daily.   Yes [provider]  clopidogrel (PLAVIX) 75 MG tablet Take 75 mg by mouth daily.   Yes [provider]  escitalopram (LEXAPRO) 10 MG tablet Take 10 mg daily by mouth.   Yes [provider]  insulin glargine (LANTUS) 100 UNIT/ML injection Inject 0.15 mLs (15 Units total) into the skin at bedtime. Patient taking differently: Inject 50 Units  into the skin daily. Breakfast. 02/15/15  Yes Rivet, Carly J, MD  insulin lispro (HUMALOG) 100 UNIT/ML injection Inject 10 Units into the skin 3 (three) times daily before meals.    Yes [provider]  memantine (NAMENDA) 5 MG tablet Take 5 mg 2 (two) times daily by mouth.   Yes [provider]  metoprolol succinate (TOPROL-XL) 100 MG 24 hr tablet Take 100 mg by mouth daily. Take with or immediately following a meal.   Yes [provider]  Rivaroxaban (XARELTO) 20 MG TABS Take 1 tablet (20 mg total) by mouth daily. 08/24/12  Yes Reynold Bowen, MD  blood glucose meter kit and supplies KIT Dispense based on patient and insurance preference. Use up to four times daily as directed. (FOR ICD-9 250.00, 250.01). 02/15/15   Rivet, Sindy Guadeloupe, MD    Inpatient medications: . sodium chloride   Intravenous Once  . furosemide  40 mg Intravenous Once  . insulin aspart  0-9 Units Subcutaneous Q4H  . metoprolol succinate  100 mg Oral Daily  . povidone-iodine  2 application Topical Once  . sodium zirconium cyclosilicate  10 g Oral BID    Discontinued Meds:   Medications Discontinued During This Encounter  Medication Reason  . NOVOLOG 100 UNIT/ML injection Discontinued by provider  . 0.9 %  sodium chloride infusion   . dextrose 50 % solution 50 mL     Social History:  reports that she has never smoked. She has never used smokeless tobacco. She reports that she does not drink alcohol or use drugs.  Family History:   Family History  Problem Relation Age of Onset  . Diabetes Father   . Coronary artery disease Father   . Stroke Mother     Review of systems not obtained due to patient factors.  Intake/Output Summary (Last 24 hours) at 08/17/2019 1148 Last data filed at 08/17/2019 1133 Gross per 24 hour  Intake 1755.02 ml  Output 150 ml  Net 1605.02 ml   BP (!) 120/58 (BP Location: Left Arm)   Pulse 79   Temp 99.5 F (37.5 C) (Oral)   Resp 17   SpO2 95%  Vitals:    08/16/19 1951 08/17/19 0038 08/17/19 0300 08/17/19 0919  BP: (!) 138/125 (!) 105/57 (!) 132/55 (!) 120/58  Pulse: 75 70 74 79  Resp:  16 15 17   Temp: 98.2 F (36.8 C) 98.5 F (36.9 C) 98.4 F (36.9 C) 99.5 F (37.5 C)  TempSrc: Oral Oral Oral Oral  SpO2: 93% 96% 94% 95%     Physical Exam  Constitutional: She is well-developed, well-nourished, and in no distress.  Alert, Attempts to answer questions and follow commands  Cardiovascular:  Irregularly irregular. Systolic murmur  Pulmonary/Chest: Effort normal and breath sounds normal. No respiratory distress.  Abdominal: Soft. Bowel sounds are normal. She exhibits no distension. There is no abdominal tenderness.  Skin:  Skin is warm and dry.  ecchymosis to Rt anterior hip, with edema Trace pretibial edema  Psychiatric: Mood and affect normal.  Loss of memory  Pt sitting in bed in NAD Lungs- CTA CVS IRR IRR, III/VI SEM no rub Abd- +BS, soft, NT/ND Ext- trace edema, Right hip ecchymosis/hematoma, + tender to palpation Neuro- slowed mentation, oriented to person and place  Labs: Basic Metabolic Panel: Recent Labs  Lab 08/15/19 2120 08/16/19 1045 08/16/19 2122 08/17/19 1100  NA 138 134* 134* 136  K 5.3* 5.6* 5.9* 4.8  CL 105 102 106 105  CO2 21* 20* 19* 20*  GLUCOSE 324* 408* 418* 352*  BUN 36* 50* 61* 66*  CREATININE 2.03* 3.47* 4.18* 3.68*  ALBUMIN 3.1*  --   --   --   CALCIUM 8.9 8.6* 8.0* 8.7*    CBC: Recent Labs  Lab 08/15/19 2120 08/16/19 1045 08/17/19 1100  WBC 17.8* 13.3* 16.9*  NEUTROABS 15.2* 10.0* 12.5*  HGB 10.9* 8.1* 6.5*  HCT 36.6 27.0* 21.0*  MCV 95.1 94.1 92.9  PLT 342 288 275   CBG: Recent Labs  Lab 08/16/19 1948 08/17/19 0037 08/17/19 0358 08/17/19 0809 08/17/19 0906  GLUCAP 391* 365* 357* 361* 373*   Urinalysis: Amber and cloudy, Mod hgb, protein, leukocytes. Microscopic: many bacteria, mucus, hyaline casts, ca oxalate crystal  Sodium Urine: 33 Creatinine Urine, random:  270.23   Xrays/Other Studies: Dg Chest 1 View  Result Date: 08/15/2019 CLINICAL DATA:  Fall, right hip fracture EXAM: CHEST  1 VIEW COMPARISON:  02/15/2015 FINDINGS: Heart and mediastinal contours are within normal limits. No focal opacities or effusions. No acute bony abnormality. IMPRESSION: No active disease. Electronically Signed   By: Rolm Baptise M.D.   On: 08/15/2019 20:33   Dg Pelvis 1-2 Views  Result Date: 08/15/2019 CLINICAL DATA:  Fall, right hip pain EXAM: PELVIS - 1-2 VIEW COMPARISON:  None. FINDINGS: There is a right femoral intertrochanteric fracture with varus angulation. Lesser trochanter fragment is displaced. No subluxation or dislocation. SI joints symmetric. Degenerative changes in the visualized lower lumbar spine. IMPRESSION: Right femoral intertrochanteric fracture with varus angulation. Electronically Signed   By: Rolm Baptise M.D.   On: 08/15/2019 20:29   Dg Femur Min 2 Views Right  Result Date: 08/15/2019 CLINICAL DATA:  Fall, right hip pain EXAM: RIGHT FEMUR 2 VIEWS COMPARISON:  Pelvis images today FINDINGS: There is a comminuted right femoral intertrochanteric fracture with moderately displaced fracture fragments and varus angulation. No subluxation or dislocation. No additional acute bony abnormality. Advanced degenerative changes in the right knee. IMPRESSION: Comminuted right femoral intertrochanteric fracture with varus angulation. Electronically Signed   By: Rolm Baptise M.D.   On: 08/15/2019 20:29   EKG 08/15/2019: A-Fib, left axis deviation  Renal US 08/17/2019: IMPRESSION: 1. Negative for obstructive uropathy. 2. 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   Assessment/Plan: 1.  Acute on chronic acute kidney injury. No nephrotoxic medications identified, unsure of NSAID use prior to hospitalization. Renal US not revealing. 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 Fractional excreted sodium  calculated is consistent with prerenal cause. Previous CKD Stage III, she has new onset of worsening function with BUN 66, Cre 3.68. With hypotension noted yesterday and dropping hemoglobin today of 6.5 in setting of recent fall while on xarelto, this is likely acute tubular necrosis due to acute blood loss, anemia and hypotension. Bruising and edema noted on right hip during exam is concerning for internal  bleed.  Urine culture  Continue gentle IV fluids - NS 146m/hr. Will continue to monitor, no acute need for dialysis at this time  2. Hyperkalemia. Likely secondary to the acute on chronic acute kidney injury. Levels increased to 5.9, however have decreased with most recent draw.   Lasix 477msingle dose given. Will continue to monitor  3.   Hypocalcemia. Likely also secondary to the acute on chronic acute kidney injury. Most recent 8.6  4.   Acute normocytic Anemia. Likely due to acute blood loss. Most recent Hgb 6.5.   Transfuse 2 units, managed by hospitalist  5.   Rt intertrochanteric hip fracture  Surgery postponed due to acute anemia requiring transfusion  6.   Atrial fibrillation, aortic stenosis, HTN and CAD  Xarelto held for scheduled surgery, would recommend switch to Eliquis due to renal effects.   7.  DM type II  Insulin regimen, managed by hospitalist  Cameon Ford 08/17/2019, 11:48 AM   I have seen and examined this patient and agree with plan and assessment in the above note with renal recommendations/intervention highlighted.  Assessment and Plan: 1. AKI/CKD stage 4- presumably ischemic ATN in setting of ABLA/hypotension and decreased po intake.  Thankfully her Scr has improved with IVF's overnight.  No urgent indication for dialysis and will continue with IVF's and follow UOP and daily Scr 2. 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  Improved with IVF's and will follow.  Ordered IV lasix x 1 since she is getting  blood products today 3. ABLA- significant drop in Hgb and drop in BP.  Agree with blood transfusion.  xarelto on hold but given AKI still having effects.  Continue to follow H/H and transfuse as needed. 4. Right IT hip fracture- agree with holding surgery today in light of hemorrhage and AKI.   5. Atrial fibrillation- rate controlled 6. Hypocalcemia- likely due to [facility] and vit D levels, cont to follow 7. DM type II- per primary 8. Aortic stenosis- stable 9. CAD- asymptomatic 10. HTN- BP was low, stable for now and follow after blood transfusion.  JoGovernor Rooksoladonato,MD 08/17/2019 4:39 PM

## 2019-08-17 NOTE — Plan of Care (Signed)

## 2019-08-17 NOTE — Progress Notes (Signed)
Notified by short stay RN that scheduled surgery is cancelled. Dr. Roger Shelter updated, diet resumed per MD. Husband at bedside updated.

## 2019-08-17 NOTE — Progress Notes (Signed)
Pt completed 1 unit PRBC transfusion, no adverse reactions noted, vital signs taken and recorded. Will continue to monitor.

## 2019-08-17 NOTE — Progress Notes (Signed)
CRITICAL VALUE ALERT  Critical Value:  Hemoglobin 6.5  Date & Time Notied:  08/17/19 11:40  Provider Notified: Dr. Roger Shelter  Orders Received/Actions taken: new orders received

## 2019-08-17 NOTE — Care Management (Signed)
CM consult acknowledged to assist with any HH/DME needs. Awaiting PT/OT eval for DCP recommendations and will continue to follow.  Emmelina Mcloughlin MSN, RN, NCM-BC, ACM-RN [phone] 

## 2019-08-17 NOTE — Progress Notes (Signed)
Inpatient Diabetes Program Recommendations  AACE/ADA: New Consensus Statement on Inpatient Glycemic Control (2015)  Target Ranges:  Prepandial:   less than 140 mg/dL      Peak postprandial:   less than 180 mg/dL (1-2 hours)      Critically ill patients:  140 - 180 mg/dL   Lab Results  Component Value Date   GLUCAP 312 (H) 08/17/2019    Review of Glycemic Control  Diabetes history: DM2 Outpatient Diabetes medications: Lantus 50 units qd + Humalog 10 units tid Current orders for Inpatient glycemic control: Novolog sensitive correction q 4 hrs.  Inpatient Diabetes Program Recommendations:   -Add Lantus 50 units daily (patient's home dose) due to patient's CBGs >300.  Thank you, Nani Gasser. Lareen Mullings, RN, MSN, CDE  Diabetes Coordinator Inpatient Glycemic Control Team Team Pager [phone] (8am-5pm) 08/17/2019 2:04 PM

## 2019-08-17 NOTE — Progress Notes (Signed)
Initial Nutrition Assessment  DOCUMENTATION CODES:   Not applicable  INTERVENTION:   -MVI with minerals daily -Glucerna Shake po TID, each supplement provides 220 kcal and 10 grams of protein  NUTRITION DIAGNOSIS:   Increased nutrient needs related to wound healing as evidenced by estimated needs.  GOAL:   Patient will meet greater than or equal to 90% of their needs  MONITOR:   PO intake, Supplement acceptance, Labs, Weight trends, Skin, I & O's  REASON FOR ASSESSMENT:   Consult Hip fracture protocol  ASSESSMENT:   Jenny Glover is a 73 y.o. female with history of A. fib, CAD, diabetes mellitus, hypertension, dementia was brought to the ER the patient had a fall at home but denies hitting her head also incontinence but denies any chest pain or shortness of breath after the fall had right hip pain and was brought to the ER.  Pt admitted with rt hip fracture s/p fall.   Reviewed I/O's: +1.2 L x 24 hours  Pt originally scheduled for IM nailing today, however, surgery postponed secondary to worsening kidney function and severe anemia.   Pt resting quietly at time of visit. Husband on phone in [name] chair.   Pt just advanced to a carb modified diet due to cancelled surgery. No meal intake data available to assess at this time.  Reviewed wt hx; noted pt has experienced a 23.4% wt loss over the past 13 months, which is concerning given pt's dementia and presence of pressure injury.   No results found for: HGBA1C PTA DM medications are 50 unit insulin glargine daily and 10 units insulin lispro TID.   Labs reviewed: CBGS: 357-373 (inpatient orders for glycemic control are 0-9 units insulin aspart every 4 hours).   NUTRITION - FOCUSED PHYSICAL EXAM:    Most Recent Value  Orbital Region  No depletion  Upper Arm Region  No depletion  Thoracic and Lumbar Region  No depletion  Buccal Region  No depletion  Temple Region  No depletion  Clavicle Bone Region  No depletion   Clavicle and Acromion Bone Region  No depletion  Scapular Bone Region  No depletion  Dorsal Hand  No depletion  Patellar Region  No depletion  Anterior Thigh Region  No depletion  Posterior Calf Region  No depletion  Edema (RD Assessment)  Mild  Hair  Reviewed  Eyes  Reviewed  Mouth  Reviewed  Skin  Reviewed  Nails  Reviewed       Diet Order:   Diet Order            Diet Carb Modified Fluid consistency: Thin; Room service appropriate? Yes  Diet effective now              EDUCATION NEEDS:   No education needs have been identified at this time  Skin:  Skin Assessment: Skin Integrity Issues: Skin Integrity Issues:: Stage II Stage II: buttocks  Last BM:  Unknown  Height:   Ht Readings from Last 1 Encounters:  08/15/19 5\' 10"  (1.778 m)    Weight:   Wt Readings from Last 1 Encounters:  08/15/19 87 kg    Ideal Body Weight:  72.7 kg  BMI:  Estimated body mass index is 27.51 kg/m as calculated from the following:   Height as of an earlier encounter on 08/15/19: 5\' 10"  (1.778 m).   Weight as of an earlier encounter on 08/15/19: 87 kg.  Estimated Nutritional Needs:   Kcal:  2000-2200  Protein:  100-115 grams  Fluid:  2-2.2 L    Jenny Glover A. Jimmye Norman, RD, LDN, [place] Registered Dietitian II Certified Diabetes Care and Education Specialist Pager: [phone] After hours Pager: [phone]

## 2019-08-17 NOTE — Progress Notes (Signed)
MD paged about Pt's potassium level of 5.9, new orders received.

## 2019-08-17 NOTE — Progress Notes (Signed)
Spoke with floor RN regarding patient scheduled for surgery this afternoon and currently has a potassium of 5.9. Floor RN paged attending to make aware.

## 2019-08-18 DIAGNOSIS — S72001A Fracture of unspecified part of neck of right femur, initial encounter for closed fracture: Secondary | ICD-10-CM | POA: Diagnosis not present

## 2019-08-18 LAB — GLUCOSE, CAPILLARY
Glucose-Capillary: 208 mg/dL — ABNORMAL HIGH (ref 70–99)
Glucose-Capillary: 265 mg/dL — ABNORMAL HIGH (ref 70–99)
Glucose-Capillary: 307 mg/dL — ABNORMAL HIGH (ref 70–99)
Glucose-Capillary: 322 mg/dL — ABNORMAL HIGH (ref 70–99)
Glucose-Capillary: 348 mg/dL — ABNORMAL HIGH (ref 70–99)
Glucose-Capillary: 377 mg/dL — ABNORMAL HIGH (ref 70–99)

## 2019-08-18 LAB — HEMOGLOBIN AND HEMATOCRIT, BLOOD
HCT: 22 % — ABNORMAL LOW (ref 36.0–46.0)
Hemoglobin: 7.3 g/dL — ABNORMAL LOW (ref 12.0–15.0)

## 2019-08-18 LAB — RENAL FUNCTION PANEL
Albumin: 2.5 g/dL — ABNORMAL LOW (ref 3.5–5.0)
Anion gap: 8 (ref 5–15)
BUN: 57 mg/dL — ABNORMAL HIGH (ref 8–23)
CO2: 22 mmol/L (ref 22–32)
Calcium: 8.4 mg/dL — ABNORMAL LOW (ref 8.9–10.3)
Chloride: 105 mmol/L (ref 98–111)
Creatinine, Ser: 2.54 mg/dL — ABNORMAL HIGH (ref 0.44–1.00)
GFR calc Af Amer: 21 mL/min — ABNORMAL LOW (ref >60)
GFR calc non Af Amer: 18 mL/min — ABNORMAL LOW (ref >60)
Glucose, Bld: 281 mg/dL — ABNORMAL HIGH (ref 70–99)
Phosphorus: 3.2 mg/dL (ref 2.5–4.6)
Potassium: 4.3 mmol/L (ref 3.5–5.1)
Sodium: 135 mmol/L (ref 135–145)

## 2019-08-18 LAB — VITAMIN D 25 HYDROXY (VIT D DEFICIENCY, FRACTURES): Vit D, 25-Hydroxy: 29.56 ng/mL — ABNORMAL LOW (ref 30–100)

## 2019-08-18 LAB — CBC
HCT: 21.7 % — ABNORMAL LOW (ref 36.0–46.0)
Hemoglobin: 7.2 g/dL — ABNORMAL LOW (ref 12.0–15.0)
MCH: 29.6 pg (ref 26.0–34.0)
MCHC: 33.2 g/dL (ref 30.0–36.0)
MCV: 89.3 fL (ref 80.0–100.0)
Platelets: 211 10*3/uL (ref 150–400)
RBC: 2.43 MIL/uL — ABNORMAL LOW (ref 3.87–5.11)
RDW: 14.4 % (ref 11.5–15.5)
WBC: 14.3 10*3/uL — ABNORMAL HIGH (ref 4.0–10.5)
nRBC: 0 % (ref 0.0–0.2)

## 2019-08-18 LAB — PREPARE RBC (CROSSMATCH)

## 2019-08-18 MED ORDER — SODIUM CHLORIDE 0.9% IV SOLUTION
Freq: Once | INTRAVENOUS | Status: AC
  Administered 2019-08-18: 15:00:00 via INTRAVENOUS

## 2019-08-18 NOTE — Progress Notes (Addendum)
Inpatient Diabetes Program Recommendations  AACE/ADA: New Consensus Statement on Inpatient Glycemic Control (2015)  Target Ranges:  Prepandial:   less than 140 mg/dL      Peak postprandial:   less than 180 mg/dL (1-2 hours)      Critically ill patients:  140 - 180 mg/dL   Lab Results  Component Value Date   GLUCAP 208 (H) 08/18/2019    Review of Glycemic Control Results for JANIECE, SCOVILL (MRN [id number]) as of 08/18/2019 12:00  Ref. Range 08/17/2019 08:09 08/17/2019 09:06 08/17/2019 11:55 08/17/2019 16:32 08/17/2019 20:12 08/18/2019 00:27 08/18/2019 03:51 08/18/2019 08:08  Glucose-Capillary Latest Ref Range: 70 - 99 mg/dL 361 (H) 373 (H) 312 (H) 383 (H) 378 (H) 348 (H) 265 (H) 208 (H)   Diabetes history: DM2 Outpatient Diabetes medications: Lantus 50 units qd + Humalog 10 units tid Current orders for Inpatient glycemic control:  Novolog sensitive correction q 4 hrs Lantus 25 units bid  Glucerna tid between meals  Inpatient Diabetes Program Recommendations:    Glucose trends improved, however, is still >200. Renal function improving but still elevated. Patient takes meal coverage at home.  Increase Lantus to 28 units bid  Add Novolog 5 units tid meal coverage if eating at least 50% of meals.  Thank you, Tama Headings RN, MSN, BC-ADM Inpatient Diabetes Coordinator Team Pager [phone] (8a-5p)

## 2019-08-18 NOTE — Progress Notes (Signed)
Late entry       Subjective:  Patient seen and examined. C/o R hip pain. Surgery cancelled due to ARF, anemia, hypotension  Objective:   VITALS:   Vitals:   08/17/19 1809 08/17/19 2007 08/18/19 0338 08/18/19 0812  BP: (!) 136/52 (!) 129/94 (!) 126/109 (!) 113/55  Pulse: 84 82 85 68  Resp: 15 18 18 17   Temp: 98.6 F (37 C) 98.3 F (36.8 C) 98.6 F (37 C) 98.4 F (36.9 C)  TempSrc: Oral Oral Oral Oral  SpO2: 98% 97% 93% 93%    NAD ABD soft RLE: Shortened and externally rotated. Thigh midly swollen. Compartments soft and compressible. (+) TA/GS/EHL. 2+ DP. SILT.  Lab Results  Component Value Date   WBC 14.3 (H) 08/18/2019   HGB 7.2 (L) 08/18/2019   HCT 21.7 (L) 08/18/2019   MCV 89.3 08/18/2019   PLT 211 08/18/2019   BMET    Component Value Date/Time   NA 135 08/18/2019 0332   K 4.3 08/18/2019 0332   CL 105 08/18/2019 0332   CO2 22 08/18/2019 0332   GLUCOSE 281 (H) 08/18/2019 0332   BUN 57 (H) 08/18/2019 0332   CREATININE 2.54 (H) 08/18/2019 0332   CALCIUM 8.4 (L) 08/18/2019 0332   GFRNONAA 18 (L) 08/18/2019 0332   GFRAA 21 (L) 08/18/2019 0332     Assessment/Plan: 1 Day Post-Op   Principal Problem:   Closed right hip fracture, initial encounter ([place]) Active Problems:   DM (diabetes mellitus) with peripheral vascular complication (HCC)   Hypertensive heart disease   Chronic atrial fibrillation   CAD (coronary artery disease)   Senile dementia, uncomplicated (HCC)   OSA on CPAP   Aortic stenosis   ARF (acute renal failure) (HCC)   Pressure injury of skin   NWB RLE Bed rest DVT ppx: hold xarelto PO pain control PT/OT ABLA: receiving PRBCs Dispo: Transfuse to hgb of 10, proceed with surgery when medically ready   Hilton Cork Nikka Hakimian 08/18/2019, 12:04 PM   Rod Can, MD [phone] [facility] is now [facility]  [facility] [place] [address], [address], [place], [place] [address] Phone: [phone]  [email or web address] Facebook  [employer]

## 2019-08-18 NOTE — Progress Notes (Signed)
Admit: 08/15/2019 LOS: 3  HPI: Jenny Glover has a past medical history of CAD, HTN, Afib, aortic stenosis, DMII, CKDIII, OSA, and dementia who presented to the emergency room 12/1 with right hip pain and was found to have a Rt intertrochanteric fracture s/p fall at home and was admitted for further management. She has been seen by ortho with plan for inpatient surgery. During her hospitalization, she became hypotensive 12/2 with a BP of 79/70. Her potassium increased from 5.3 to 5.9, urea 36 to 61, creatinine from 2.03 on admission to 4.18 and her urine output decreased. Additionally, her hemoglobin decreased from 10.1 to 6.5. Nephrology was consulted for hyperkalemia and acute kidney injury reflected. Her verbal history is not reliable due to her dementia, most information obtained through chart or with husband Timmothy Sours.   Subjective:  . Today she is accompanied by her husband. She is feeling well.   12/03 0701 - 12/04 0700 In: 690 [I.V.:690] Out: 1300 [Urine:1300]   Scheduled Meds: . feeding supplement (GLUCERNA SHAKE)  237 mL Oral TID BM  . insulin aspart  0-9 Units Subcutaneous Q4H  . insulin glargine  25 Units Subcutaneous BID  . metoprolol succinate  100 mg Oral Daily  . multivitamin with minerals  1 tablet Oral Daily  . povidone-iodine  2 application Topical Once   Continuous Infusions: PRN Meds:.hydrALAZINE, morphine injection  Current Labs:   Recent Labs  Lab 08/16/19 2122 08/17/19 1100 08/18/19 0332  NA 134* 136 135  K 5.9* 4.8 4.3  CL 106 105 105  CO2 19* 20* 22  GLUCOSE 418* 352* 281*  BUN 61* 66* 57*  CREATININE 4.18* 3.68* 2.54*  CALCIUM 8.0* 8.7* 8.4*  PHOS  --   --  3.2   Recent Labs  Lab 08/15/19 2120 08/16/19 1045 08/17/19 1100 08/17/19 1900 08/18/19 0332  WBC 17.8* 13.3* 16.9*  --  14.3*  NEUTROABS 15.2* 10.0* 12.5*  --   --   HGB 10.9* 8.1* 6.5* 7.7* 7.2*  HCT 36.6 27.0* 21.0* 23.7* 21.7*  MCV 95.1 94.1 92.9  --  89.3  PLT 342 288 275  --  211     Physical Exam:  Blood pressure (!) 113/55, pulse 68, temperature 98.4 F (36.9 C), temperature source Oral, resp. rate 17, SpO2 93 %. Physical Exam  Constitutional: She is well-developed, well-nourished, and in no distress.  Cardiovascular:  Irregularly, irregular. Systolic murmur  Pulmonary/Chest: Effort normal and breath sounds normal.  Abdominal: Soft. Bowel sounds are normal. She exhibits no distension. There is no abdominal tenderness.  Neurological:  Slowed mentation  Skin: Skin is warm and dry.  Skin is taught and warm in Rt hip area Trace pretibial edema Rt leg  Psychiatric: Affect normal.    Assessment and Plan  1. Acute on chronic acute kidney injury. Presumed ischemic ATN in setting of acute blood loss anemia, hypotension and reduced oral intake. Renal function has continued to improve. Cre was 2.03 on admission and peaked at 4.18, today is 2.54. BUN peaked at 66, down to 57 today. Leukocytes on U/A.   Urine culture clean catch sent  Continue gentle IV fluids - NS 135mL/hr. Will continue to monitor, no acute need for dialysis at this time  2.   Hyperkalemia. Resolved. Likely secondary to the acute on chronic acute kidney injury.   3.   Hypocalcemia. On further evaluation, appears to correct within normal limits with albumin. PTH outstanding.        Vit. D level slightly decreased  Vit D supplement due to low serum level and recent fracture  4.   Acute normocytic Anemia. Likely due to acute blood loss.  Hgb 6.5 yesterday, transfused 2 units, today is 7.2.   5.   Rt intertrochanteric hip fracture  Surgery postponed due to acute anemia requiring transfusion  6.   Atrial fibrillation, aortic stenosis, HTN and CAD  Xarelto held for scheduled surgery, would recommend switch to Eliquis due to renal effects.   7.  DM type II  Insulin regimen, managed by hospitalist    Edwena Felty PA student 08/18/2019, 11:02 AM   I have personally seen and examined  this patient and agree with the assessment/plan as outlined above by Edwena Felty PA student. 1.  Acute kidney injury: Appears to have been hemodynamically mediated and likely with mild ATN and is improving with conservative management/hemodynamic support.  Excellent urine output overnight with downtrending creatinine and no indications for dialysis.  Hyperkalemia is corrected and metabolic acidosis better. 2.  Acute blood loss anemia: Hemoglobin level again trending down with some tenderness over her hip fracture surgery site-May need to involve orthopedic surgery again to see if imaging is required with possible evacuation of hematoma.  Dajane Valli K.,MD 08/18/2019 12:32 PM

## 2019-08-18 NOTE — Plan of Care (Signed)

## 2019-08-18 NOTE — Progress Notes (Signed)
     Subjective:  Patient seen and examined. C/o R hip pain.   Objective:   VITALS:   Vitals:   08/17/19 1809 08/17/19 2007 08/18/19 0338 08/18/19 0812  BP: (!) 136/52 (!) 129/94 (!) 126/109 (!) 113/55  Pulse: 84 82 85 68  Resp: 15 18 18 17   Temp: 98.6 F (37 C) 98.3 F (36.8 C) 98.6 F (37 C) 98.4 F (36.9 C)  TempSrc: Oral Oral Oral Oral  SpO2: 98% 97% 93% 93%    NAD ABD soft RLE: Shortened and externally rotated. Thigh midly swollen. Compartments soft and compressible. (+) TA/GS/EHL. 2+ DP. SILT.  Lab Results  Component Value Date   WBC 14.3 (H) 08/18/2019   HGB 7.2 (L) 08/18/2019   HCT 21.7 (L) 08/18/2019   MCV 89.3 08/18/2019   PLT 211 08/18/2019   BMET    Component Value Date/Time   NA 135 08/18/2019 0332   K 4.3 08/18/2019 0332   CL 105 08/18/2019 0332   CO2 22 08/18/2019 0332   GLUCOSE 281 (H) 08/18/2019 0332   BUN 57 (H) 08/18/2019 0332   CREATININE 2.54 (H) 08/18/2019 0332   CALCIUM 8.4 (L) 08/18/2019 0332   GFRNONAA 18 (L) 08/18/2019 0332   GFRAA 21 (L) 08/18/2019 0332     Assessment/Plan: 1 Day Post-Op   Principal Problem:   Closed right hip fracture, initial encounter ([place]) Active Problems:   DM (diabetes mellitus) with peripheral vascular complication (HCC)   Hypertensive heart disease   Chronic atrial fibrillation   CAD (coronary artery disease)   Senile dementia, uncomplicated (HCC)   OSA on CPAP   Aortic stenosis   ARF (acute renal failure) (HCC)   Pressure injury of skin   NWB RLE Bed rest DVT ppx: hold xarelto PO pain control PT/OT ABLA: s/p PRBCs, hgb now 7.2 Dispo: Transfuse to hgb closer to 10, recheck labs in am, hopefully surgery tomorrow, NPO after MN   Hilton Cork Versie Fleener 08/18/2019, 1:30 PM   Rod Can, MD [phone] [facility] is now [facility]  [facility] [place] [address], [place], [place] [address] Phone: [phone] [email or web address] Facebook  [employer]

## 2019-08-18 NOTE — Progress Notes (Signed)
PROGRESS NOTE    Patient: Jenny Glover                            PCP: Reynold Bowen, MD                    DOB: [birth year]            DOA: 08/15/2019 LYY:[id number]             DOS: 08/18/2019, 12:55 PM   LOS: 3 days   Date of Service: The patient was seen and examined on 08/18/2019  Subjective:   Pain was seen and examined this morning, remained stable no acute distress.  Denies any chest pain or shortness of breath.  Complaining of tenderness on her hip.  Especially with any kind of exertion or movement.  She tolerated 2 units of packed red blood cell transfusion yesterday. Still urinating with no difficulties. Remains hemodynamically stable.   Brief Narrative:  Jenny Glover is a 73 y.o. female with history of A. fib, CAD, diabetes mellitus, hypertension, dementia was brought to the ER the patient had a fall at home but denies hitting her head also incontinence but denies any chest pain or shortness of breath after the fall had right hip pain and was brought to the ER.  ED Course: x-ray showed right hip fracture and on-call orthopedic surgeon Dr. Doran Durand has been consulted.  Patient's last dose of Xarelto was this morning.  Patient's labs show acute renal failure with anemia with mild hyperkalemia.  EKG shows A. fib rate controlled.  COVID-19 test is negative.  Patient admitted for further management.   Assessment & Plan:   Principal Problem:   Closed right hip fracture, initial encounter ([place]) Active Problems:   DM (diabetes mellitus) with peripheral vascular complication (HCC)   Hypertensive heart disease   Chronic atrial fibrillation   CAD (coronary artery disease)   Senile dementia, uncomplicated (HCC)   OSA on CPAP   Aortic stenosis   ARF (acute renal failure) (HCC)   Pressure injury of skin    Right hip fracture status post mechanical fall  Remained stable but continues to complain of pain with any movement in bed. -Surgery was canceled yesterday due to  anemia, acute renal failure.  -Orthopedic team consulted, and following pending anticipated ORIF   Patient last dose of Xarelto was around 10 AM in the morning of 08/15/2019.   -Cleared for surgery on 08/16/2019 at 10 AM.  -Recommending with surgery in a.m. if remained hemodynamically stable, stable H&H, improved kidney function.    A. fib  holding Xarelto due to anticipation of surgery.  Family and patient aware.  Agreeable.   On Toprol for rate control.  Acute renal sufficiency with hyperkalemia -Likely acute tubular necrosis due to hypotension, and acute anemia. -Monitoring kidney function closely -Monitoring BUN/creatinine closely... Elevated BUN/creatinine from baseline - Cr.  3.8--> 4.18 >>> 2.54 -Per patient had a history of renal sufficiency in the past,  -We will continue IV fluid hydration -We will avoid all nephrotoxins -Appreciate nephrology following and recommendations    Acute on chronic normocytic normochromic anemia  -likely worsening due to right hip hematoma secondary to fracture on Xarelto - Hemoglobin 8.1 >>> 6.5  >> s/p 2 u PRBS transfusion >> 7.7>>7.2 -Monitoring H&H closely  Moderate aortic stenosis per 2D echo done in 2019.  Presently and asymptomatic.  CAD denies any chest pain.  On Toprol.  Presently n.p.o.  Hypertension -brief episode of hypotension overnight due to anemia hypovolemia.  Continue IV fluid hydration we will keep patient on Toprol and as needed IV hydralazine.  Dementia on Namenda to be continued after surgery.   Diabetes mellitus II -on Lantus.  Presently I kept patient on sliding scale coverage and will decide Lantus dose based on the CBG and resume full dose once patient starts eating.  COVID-19 test was negative.     DVT prophylaxis: SCDs.  Start Xarelto after surgery when okay with orthopedics. Code Status: Full code confirmed with patient husband. Family Communication: Patient husband. Disposition Plan: Most likely  will need rehab. Consults called: Orthopedics. Admission status: Inpatient. Given the hip fracture patient, acute on chronic anemia, AKI, hyperkalemia is the criteria for admission likely greater than 2 midnight stay.    Echo 07/22/2018 Study Conclusions - Left ventricle: The cavity size was normal. Wall thickness was increased in a pattern of moderate LVH. Systolic function was normal. The estimated ejection fraction was in the range of 60% to 65%. Wall motion was normal; there were no regional wall motion abnormalities. - Aortic valve: There was moderate stenosis. Valve area (VTI): 0.65 cm^2. Valve area (Vmax): 0.61 cm^2. Valve area (Vmean): 0.65 cm^2. - Mitral valve: There was mild regurgitation. - Left atrium: The atrium was moderately dilated. - Right atrium: The atrium was moderately dilated. - Pulmonary arteries: PA peak pressure: 56 mm Hg (S).    Procedures:   No admission procedures for hospital encounter.   Right hip for reduction internal fixation tentatively 12-20  Antimicrobials:  Anti-infectives (From admission, onward)   Start     Dose/Rate Route Frequency Ordered Stop   08/17/19 0600  ceFAZolin (ANCEF) IVPB 2g/100 mL premix     2 g 200 mL/hr over 30 Minutes Intravenous On call to O.R. 08/16/19 2002 08/18/19 0559       Medication:  . feeding supplement (GLUCERNA SHAKE)  237 mL Oral TID BM  . insulin aspart  0-9 Units Subcutaneous Q4H  . insulin glargine  25 Units Subcutaneous BID  . metoprolol succinate  100 mg Oral Daily  . multivitamin with minerals  1 tablet Oral Daily  . povidone-iodine  2 application Topical Once    hydrALAZINE, morphine injection   Objective:   Vitals:   08/17/19 1809 08/17/19 2007 08/18/19 0338 08/18/19 0812  BP: (!) 136/52 (!) 129/94 (!) 126/109 (!) 113/55  Pulse: 84 82 85 68  Resp: 15 18 18 17   Temp: 98.6 F (37 C) 98.3 F (36.8 C) 98.6 F (37 C) 98.4 F (36.9 C)  TempSrc: Oral Oral Oral Oral   SpO2: 98% 97% 93% 93%    Intake/Output Summary (Last 24 hours) at 08/18/2019 1255 Last data filed at 08/18/2019 0315 Gross per 24 hour  Intake 135 ml  Output 1150 ml  Net -1015 ml   There were no vitals filed for this visit.   Examination:    BP (!) 113/55 (BP Location: Left Arm)   Pulse 68   Temp 98.4 F (36.9 C) (Oral)   Resp 17   SpO2 93%    Physical Exam  Constitution:  Alert, cooperative, no distress,  Psychiatric: Normal and stable mood and affect, cognition intact,   HEENT: Normocephalic, PERRL, otherwise with in Normal limits  Chest:Chest symmetric Cardio vascular:  S1/S2, RRR, No murmure, No Rubs or Gallops  pulmonary: Clear to auscultation bilaterally, respirations unlabored, negative wheezes / crackles Abdomen: Soft, non-tender, non-distended, bowel sounds,no masses, no  organomegaly Muscular skeletal:  Left lower extremity, hip tenderness.  Limited range of motion on the left lower extremity.  Limited exam - in bed, able to move all 4 extremities, Normal strength,  Neuro: CNII-XII intact. , normal motor and sensation, reflexes intact  Extremities:  Left hip pain, limited movement due to pain.  No pitting edema lower extremities, +2 pulses  Skin: Dry, warm to touch, negative for any Rashes, No open wounds Wounds: per nursing documentation Pressure Injury 08/16/19 Buttocks Left Stage II -  Partial thickness loss of dermis presenting as a shallow open ulcer with a red, pink wound bed without slough. (Active)  08/16/19 1805  Location: Buttocks  Location Orientation: Left  Staging: Stage II -  Partial thickness loss of dermis presenting as a shallow open ulcer with a red, pink wound bed without slough.  Wound Description (Comments):   Present on Admission: Yes     LABs:  CBC Latest Ref Rng & Units 08/18/2019 08/17/2019 08/17/2019  WBC 4.0 - 10.5 K/uL 14.3(H) - 16.9(H)  Hemoglobin 12.0 - 15.0 g/dL 7.2(L) 7.7(L) 6.5(LL)  Hematocrit 36.0 - 46.0 % 21.7(L) 23.7(L)  21.0(L)  Platelets 150 - 400 K/uL 211 - 275   CMP Latest Ref Rng & Units 08/18/2019 08/17/2019 08/16/2019  Glucose 70 - 99 mg/dL 281(H) 352(H) 418(H)  BUN 8 - 23 mg/dL 57(H) 66(H) 61(H)  Creatinine 0.44 - 1.00 mg/dL 2.54(H) 3.68(H) 4.18(H)  Sodium 135 - 145 mmol/L 135 136 134(L)  Potassium 3.5 - 5.1 mmol/L 4.3 4.8 5.9(H)  Chloride 98 - 111 mmol/L 105 105 106  CO2 22 - 32 mmol/L 22 20(L) 19(L)  Calcium 8.9 - 10.3 mg/dL 8.4(L) 8.7(L) 8.0(L)  Total Protein 6.5 - 8.1 g/dL - - -  Total Bilirubin 0.3 - 1.2 mg/dL - - -  Alkaline Phos 38 - 126 U/L - - -  AST 15 - 41 U/L - - -  ALT 0 - 44 U/L - - -        SIGNED: Deatra James, MD, FACP, FHM. [facility],  Pager [phone]  If 7PM-7AM, please contact night-coverage [email or web address] 08/18/2019, 12:55 PM

## 2019-08-19 ENCOUNTER — Encounter (HOSPITAL_COMMUNITY)
Admission: EM | Disposition: A | Discharge status: Skilled Nursing Facility | Payer: Self-pay | Source: Home / Self Care | Attending: Family Medicine

## 2019-08-19 ENCOUNTER — Inpatient Hospital Stay (HOSPITAL_COMMUNITY): Payer: Medicare HMO | Admitting: Certified Registered Nurse Anesthetist

## 2019-08-19 ENCOUNTER — Inpatient Hospital Stay (HOSPITAL_COMMUNITY): Payer: Medicare HMO

## 2019-08-19 DIAGNOSIS — Z8781 Personal history of (healed) traumatic fracture: Secondary | ICD-10-CM | POA: Diagnosis not present

## 2019-08-19 DIAGNOSIS — S72001A Fracture of unspecified part of neck of right femur, initial encounter for closed fracture: Secondary | ICD-10-CM | POA: Diagnosis not present

## 2019-08-19 DIAGNOSIS — I2510 Atherosclerotic heart disease of native coronary artery without angina pectoris: Secondary | ICD-10-CM

## 2019-08-19 DIAGNOSIS — Z9989 Dependence on other enabling machines and devices: Secondary | ICD-10-CM

## 2019-08-19 DIAGNOSIS — G4733 Obstructive sleep apnea (adult) (pediatric): Secondary | ICD-10-CM

## 2019-08-19 DIAGNOSIS — I482 Chronic atrial fibrillation, unspecified: Secondary | ICD-10-CM

## 2019-08-19 HISTORY — PX: INTRAMEDULLARY (IM) NAIL INTERTROCHANTERIC: SHX5875

## 2019-08-19 LAB — RENAL FUNCTION PANEL
Albumin: 2.4 g/dL — ABNORMAL LOW (ref 3.5–5.0)
Anion gap: 7 (ref 5–15)
BUN: 45 mg/dL — ABNORMAL HIGH (ref 8–23)
CO2: 23 mmol/L (ref 22–32)
Calcium: 8.6 mg/dL — ABNORMAL LOW (ref 8.9–10.3)
Chloride: 110 mmol/L (ref 98–111)
Creatinine, Ser: 1.61 mg/dL — ABNORMAL HIGH (ref 0.44–1.00)
GFR calc Af Amer: 37 mL/min — ABNORMAL LOW (ref >60)
GFR calc non Af Amer: 32 mL/min — ABNORMAL LOW (ref >60)
Glucose, Bld: 173 mg/dL — ABNORMAL HIGH (ref 70–99)
Phosphorus: 2.1 mg/dL — ABNORMAL LOW (ref 2.5–4.6)
Potassium: 4.3 mmol/L (ref 3.5–5.1)
Sodium: 140 mmol/L (ref 135–145)

## 2019-08-19 LAB — GLUCOSE, CAPILLARY
Glucose-Capillary: 152 mg/dL — ABNORMAL HIGH (ref 70–99)
Glucose-Capillary: 154 mg/dL — ABNORMAL HIGH (ref 70–99)
Glucose-Capillary: 157 mg/dL — ABNORMAL HIGH (ref 70–99)
Glucose-Capillary: 165 mg/dL — ABNORMAL HIGH (ref 70–99)
Glucose-Capillary: 214 mg/dL — ABNORMAL HIGH (ref 70–99)
Glucose-Capillary: 227 mg/dL — ABNORMAL HIGH (ref 70–99)
Glucose-Capillary: 243 mg/dL — ABNORMAL HIGH (ref 70–99)
Glucose-Capillary: 248 mg/dL — ABNORMAL HIGH (ref 70–99)

## 2019-08-19 LAB — PARATHYROID HORMONE, INTACT (NO CA): PTH: 38 pg/mL (ref 15–65)

## 2019-08-19 LAB — CBC
HCT: 28.9 % — ABNORMAL LOW (ref 36.0–46.0)
Hemoglobin: 9.4 g/dL — ABNORMAL LOW (ref 12.0–15.0)
MCH: 29.3 pg (ref 26.0–34.0)
MCHC: 32.5 g/dL (ref 30.0–36.0)
MCV: 90 fL (ref 80.0–100.0)
Platelets: 212 10*3/uL (ref 150–400)
RBC: 3.21 MIL/uL — ABNORMAL LOW (ref 3.87–5.11)
RDW: 14.7 % (ref 11.5–15.5)
WBC: 12.6 10*3/uL — ABNORMAL HIGH (ref 4.0–10.5)
nRBC: 0.2 % (ref 0.0–0.2)

## 2019-08-19 LAB — PROTIME-INR
INR: 1.2 (ref 0.8–1.2)
Prothrombin Time: 15.1 seconds (ref 11.4–15.2)

## 2019-08-19 SURGERY — FIXATION, FRACTURE, INTERTROCHANTERIC, WITH INTRAMEDULLARY ROD
Anesthesia: General | Site: Hip | Laterality: Right

## 2019-08-19 MED ORDER — FENTANYL CITRATE (PF) 100 MCG/2ML IJ SOLN
25.0000 ug | INTRAMUSCULAR | Status: DC | PRN
  Administered 2019-08-19: 25 ug via INTRAVENOUS

## 2019-08-19 MED ORDER — METOCLOPRAMIDE HCL 5 MG/ML IJ SOLN: 5.0000 mg | Freq: Three times a day (TID) | INTRAMUSCULAR | Status: DC | PRN

## 2019-08-19 MED ORDER — ONDANSETRON HCL 4 MG/2ML IJ SOLN
INTRAMUSCULAR | Status: AC
  Filled 2019-08-19: qty 2

## 2019-08-19 MED ORDER — LACTATED RINGERS IV SOLN
INTRAVENOUS | Status: DC
  Administered 2019-08-19 (×2): via INTRAVENOUS

## 2019-08-19 MED ORDER — FENTANYL CITRATE (PF) 250 MCG/5ML IJ SOLN
INTRAMUSCULAR | Status: DC | PRN
  Administered 2019-08-19 (×3): 50 ug via INTRAVENOUS

## 2019-08-19 MED ORDER — RIVAROXABAN 10 MG PO TABS
10.0000 mg | ORAL_TABLET | Freq: Every day | ORAL | Status: DC
  Administered 2019-08-20 – 2019-08-23 (×4): 10 mg via ORAL
  Filled 2019-08-19 (×4): qty 1

## 2019-08-19 MED ORDER — METOCLOPRAMIDE HCL 5 MG PO TABS: 5.0000 mg | ORAL_TABLET | Freq: Three times a day (TID) | ORAL | Status: DC | PRN

## 2019-08-19 MED ORDER — DOCUSATE SODIUM 100 MG PO CAPS
100.0000 mg | ORAL_CAPSULE | Freq: Two times a day (BID) | ORAL | Status: DC
  Administered 2019-08-19 – 2019-08-23 (×8): 100 mg via ORAL
  Filled 2019-08-19 (×8): qty 1

## 2019-08-19 MED ORDER — PHENOL 1.4 % MT LIQD: 1.0000 | OROMUCOSAL | Status: DC | PRN

## 2019-08-19 MED ORDER — 0.9 % SODIUM CHLORIDE (POUR BTL) OPTIME
TOPICAL | Status: DC | PRN
  Administered 2019-08-19: 1000 mL

## 2019-08-19 MED ORDER — MIDAZOLAM HCL 2 MG/2ML IJ SOLN
INTRAMUSCULAR | Status: AC
  Filled 2019-08-19: qty 2

## 2019-08-19 MED ORDER — PHENYLEPHRINE 40 MCG/ML (10ML) SYRINGE FOR IV PUSH (FOR BLOOD PRESSURE SUPPORT)
PREFILLED_SYRINGE | INTRAVENOUS | Status: DC | PRN
  Administered 2019-08-19: 40 ug via INTRAVENOUS
  Administered 2019-08-19: 80 ug via INTRAVENOUS
  Administered 2019-08-19 (×2): 40 ug via INTRAVENOUS

## 2019-08-19 MED ORDER — CEFAZOLIN SODIUM-DEXTROSE 2-4 GM/100ML-% IV SOLN
INTRAVENOUS | Status: AC
  Filled 2019-08-19: qty 100

## 2019-08-19 MED ORDER — CEFAZOLIN SODIUM-DEXTROSE 2-4 GM/100ML-% IV SOLN
2.0000 g | Freq: Once | INTRAVENOUS | Status: AC
  Administered 2019-08-19: 12:00:00 2 g via INTRAVENOUS

## 2019-08-19 MED ORDER — VITAMIN D 25 MCG (1000 UNIT) PO TABS
2000.0000 [IU] | ORAL_TABLET | Freq: Every day | ORAL | Status: DC
  Administered 2019-08-19 – 2019-08-23 (×5): 2000 [IU] via ORAL
  Filled 2019-08-19 (×5): qty 2

## 2019-08-19 MED ORDER — DEXAMETHASONE SODIUM PHOSPHATE 10 MG/ML IJ SOLN
INTRAMUSCULAR | Status: DC | PRN
  Administered 2019-08-19: 10 mg via INTRAVENOUS

## 2019-08-19 MED ORDER — MENTHOL 3 MG MT LOZG: 1.0000 | LOZENGE | OROMUCOSAL | Status: DC | PRN

## 2019-08-19 MED ORDER — ESCITALOPRAM OXALATE 10 MG PO TABS
10.0000 mg | ORAL_TABLET | Freq: Every day | ORAL | Status: DC
  Administered 2019-08-19 – 2019-08-23 (×5): 10 mg via ORAL
  Filled 2019-08-19 (×5): qty 1

## 2019-08-19 MED ORDER — SUGAMMADEX SODIUM 200 MG/2ML IV SOLN
INTRAVENOUS | Status: DC | PRN
  Administered 2019-08-19: 200 mg via INTRAVENOUS

## 2019-08-19 MED ORDER — INSULIN ASPART 100 UNIT/ML ~~LOC~~ SOLN
SUBCUTANEOUS | Status: AC
  Filled 2019-08-19: qty 1

## 2019-08-19 MED ORDER — PROPOFOL 10 MG/ML IV BOLUS
INTRAVENOUS | Status: DC | PRN
  Administered 2019-08-19: 80 mg via INTRAVENOUS

## 2019-08-19 MED ORDER — DEXAMETHASONE SODIUM PHOSPHATE 10 MG/ML IJ SOLN
INTRAMUSCULAR | Status: AC
  Filled 2019-08-19: qty 1

## 2019-08-19 MED ORDER — FENTANYL CITRATE (PF) 100 MCG/2ML IJ SOLN
INTRAMUSCULAR | Status: AC
  Filled 2019-08-19: qty 2

## 2019-08-19 MED ORDER — ONDANSETRON HCL 4 MG PO TABS: 4.0000 mg | ORAL_TABLET | Freq: Four times a day (QID) | ORAL | Status: DC | PRN

## 2019-08-19 MED ORDER — FENTANYL CITRATE (PF) 250 MCG/5ML IJ SOLN
INTRAMUSCULAR | Status: AC
  Filled 2019-08-19: qty 5

## 2019-08-19 MED ORDER — ROCURONIUM BROMIDE 50 MG/5ML IV SOSY
PREFILLED_SYRINGE | INTRAVENOUS | Status: DC | PRN
  Administered 2019-08-19: 20 mg via INTRAVENOUS
  Administered 2019-08-19: 60 mg via INTRAVENOUS
  Administered 2019-08-19: 20 mg via INTRAVENOUS

## 2019-08-19 MED ORDER — ONDANSETRON HCL 4 MG/2ML IJ SOLN: 4.0000 mg | Freq: Four times a day (QID) | INTRAMUSCULAR | Status: DC | PRN

## 2019-08-19 MED ORDER — ATORVASTATIN CALCIUM 80 MG PO TABS
80.0000 mg | ORAL_TABLET | Freq: Every day | ORAL | Status: DC
  Administered 2019-08-19 – 2019-08-23 (×5): 80 mg via ORAL
  Filled 2019-08-19 (×5): qty 1

## 2019-08-19 MED ORDER — PROPOFOL 10 MG/ML IV BOLUS
INTRAVENOUS | Status: AC
  Filled 2019-08-19: qty 20

## 2019-08-19 MED ORDER — LIDOCAINE 2% (20 MG/ML) 5 ML SYRINGE
INTRAMUSCULAR | Status: DC | PRN
  Administered 2019-08-19: 40 mg via INTRAVENOUS

## 2019-08-19 MED ORDER — AMLODIPINE BESYLATE 5 MG PO TABS
5.0000 mg | ORAL_TABLET | Freq: Every day | ORAL | Status: DC
  Administered 2019-08-19 – 2019-08-23 (×5): 5 mg via ORAL
  Filled 2019-08-19 (×5): qty 1

## 2019-08-19 MED ORDER — PHENYLEPHRINE HCL-NACL 10-0.9 MG/250ML-% IV SOLN
INTRAVENOUS | Status: DC | PRN
  Administered 2019-08-19: 50 ug/min via INTRAVENOUS

## 2019-08-19 MED ORDER — ONDANSETRON HCL 4 MG/2ML IJ SOLN
INTRAMUSCULAR | Status: DC | PRN
  Administered 2019-08-19: 4 mg via INTRAVENOUS

## 2019-08-19 SURGICAL SUPPLY — 57 items
ADH SKN CLS APL DERMABOND .7 (GAUZE/BANDAGES/DRESSINGS) ×1
ALCOHOL 70% 16 OZ (MISCELLANEOUS) ×3 IMPLANT
APL PRP STRL LF DISP 70% ISPRP (MISCELLANEOUS) ×1
BIT DRILL AO GAMMA 4.2X130 (BIT) IMPLANT
BIT DRILL AO GAMMA 4.2X180 (BIT) ×2 IMPLANT
BNDG COHESIVE 4X5 TAN STRL (GAUZE/BANDAGES/DRESSINGS) ×2 IMPLANT
CHLORAPREP W/TINT 26 (MISCELLANEOUS) ×3 IMPLANT
COVER PERINEAL POST (MISCELLANEOUS) ×3 IMPLANT
COVER SURGICAL LIGHT HANDLE (MISCELLANEOUS) ×3 IMPLANT
COVER WAND RF STERILE (DRAPES) ×3 IMPLANT
DERMABOND ADVANCED (GAUZE/BANDAGES/DRESSINGS) ×2
DERMABOND ADVANCED .7 DNX12 (GAUZE/BANDAGES/DRESSINGS) ×2 IMPLANT
DRAPE C-ARM 42X72 X-RAY (DRAPES) ×3 IMPLANT
DRAPE C-ARMOR (DRAPES) ×3 IMPLANT
DRAPE HALF SHEET 40X57 (DRAPES) ×3 IMPLANT
DRAPE IMP U-DRAPE 54X76 (DRAPES) ×6 IMPLANT
DRAPE STERI IOBAN 125X83 (DRAPES) ×3 IMPLANT
DRAPE U-SHAPE 47X51 STRL (DRAPES) ×6 IMPLANT
DRSG MEPILEX BORDER 4X4 (GAUZE/BANDAGES/DRESSINGS) ×4 IMPLANT
DRSG PAD ABDOMINAL 8X10 ST (GAUZE/BANDAGES/DRESSINGS) IMPLANT
ELECT REM PT RETURN 9FT ADLT (ELECTROSURGICAL) ×3
ELECTRODE REM PT RTRN 9FT ADLT (ELECTROSURGICAL) ×1 IMPLANT
FACESHIELD WRAPAROUND (MASK) ×3 IMPLANT
FACESHIELD WRAPAROUND OR TEAM (MASK) ×1 IMPLANT
GLOVE BIO SURGEON STRL SZ8.5 (GLOVE) ×6 IMPLANT
GLOVE BIOGEL PI IND STRL 8.5 (GLOVE) ×1 IMPLANT
GLOVE BIOGEL PI INDICATOR 8.5 (GLOVE) ×2
GOWN STRL REUS W/ TWL LRG LVL3 (GOWN DISPOSABLE) ×2 IMPLANT
GOWN STRL REUS W/TWL 2XL LVL3 (GOWN DISPOSABLE) ×3 IMPLANT
GOWN STRL REUS W/TWL LRG LVL3 (GOWN DISPOSABLE) ×6
GUIDEROD T2 3X1000 (ROD) ×2 IMPLANT
K-WIRE  3.2X450M STR (WIRE) ×2
K-WIRE 3.2X450M STR (WIRE) ×1
KIT TURNOVER KIT B (KITS) ×3 IMPLANT
KWIRE 3.2X450M STR (WIRE) IMPLANT
MANIFOLD NEPTUNE II (INSTRUMENTS) ×1 IMPLANT
MARKER SKIN DUAL TIP RULER LAB (MISCELLANEOUS) ×3 IMPLANT
NAIL GAMMA LG R 5TI 10X400X125 (Nail) ×2 IMPLANT
NS IRRIG 1000ML POUR BTL (IV SOLUTION) ×3 IMPLANT
PACK GENERAL/GYN (CUSTOM PROCEDURE TRAY) ×3 IMPLANT
PACK UNIVERSAL I (CUSTOM PROCEDURE TRAY) ×3 IMPLANT
PAD ARMBOARD 7.5X6 YLW CONV (MISCELLANEOUS) ×6 IMPLANT
PADDING CAST ABS 4INX4YD NS (CAST SUPPLIES)
PADDING CAST ABS COTTON 4X4 ST (CAST SUPPLIES) IMPLANT
REAMER SHAFT BIXCUT (INSTRUMENTS) ×2 IMPLANT
SCREW LAG GAMMA 3 110MM (Screw) ×2 IMPLANT
SCREW LOCKING FULL THREAD 5X52 (Screw) ×2 IMPLANT
SCREW LOCKING T2 F/T  5MMX45MM (Screw) ×2 IMPLANT
SCREW LOCKING T2 F/T 5MMX45MM (Screw) IMPLANT
STAPLER VISISTAT 35W (STAPLE) ×2 IMPLANT
SUT MNCRL AB 3-0 PS2 27 (SUTURE) ×1 IMPLANT
SUT MON AB 2-0 CT1 27 (SUTURE) ×3 IMPLANT
SUT MON AB 2-0 CT1 36 (SUTURE) ×2 IMPLANT
SUT VIC AB 1 CT1 27 (SUTURE) ×3
SUT VIC AB 1 CT1 27XBRD ANBCTR (SUTURE) ×1 IMPLANT
TOWEL GREEN STERILE (TOWEL DISPOSABLE) ×3 IMPLANT
TOWEL GREEN STERILE FF (TOWEL DISPOSABLE) ×3 IMPLANT

## 2019-08-19 NOTE — Transfer of Care (Signed)
Immediate Anesthesia Transfer of Care Note  Patient: Jenny Glover  Procedure(s) Performed: INTRAMEDULLARY (IM) NAIL INTERTROCHANTRIC (Right Hip)  Patient Location: PACU  Anesthesia Type:General  Level of Consciousness: drowsy and patient cooperative  Airway & Oxygen Therapy: Patient Spontanous Breathing  Post-op Assessment: Report given to RN and Post -op Vital signs reviewed and stable  Post vital signs: Reviewed and stable  Last Vitals:  Vitals Value Taken Time  BP 157/67 08/19/19 1419  Temp    Pulse 73 08/19/19 1422  Resp 23 08/19/19 1422  SpO2 90 % 08/19/19 1422  Vitals shown include unvalidated device data.  Last Pain:  Vitals:   08/19/19 0812  TempSrc: Oral  PainSc: Asleep         Complications: No apparent anesthesia complications

## 2019-08-19 NOTE — Anesthesia Postprocedure Evaluation (Signed)
Anesthesia Post Note  Patient: Jenny Glover  Procedure(s) Performed: INTRAMEDULLARY (IM) NAIL INTERTROCHANTRIC (Right Hip)     Patient location during evaluation: PACU Anesthesia Type: General Level of consciousness: awake and alert Pain management: pain level controlled Vital Signs Assessment: post-procedure vital signs reviewed and stable Respiratory status: spontaneous breathing, nonlabored ventilation, respiratory function stable and patient connected to nasal cannula oxygen Cardiovascular status: blood pressure returned to baseline and stable Postop Assessment: no apparent nausea or vomiting Anesthetic complications: no    Last Vitals:  Vitals:   08/19/19 1518 08/19/19 1532  BP:  (!) 145/65  Pulse: 81 86  Resp: 18   Temp: 36.7 C 36.6 C  SpO2: 100% 100%    Last Pain:  Vitals:   08/19/19 1532  TempSrc: Oral  PainSc: Asleep    LLE Motor Response: Purposeful movement;Responds to commands (08/19/19 1543)   RLE Motor Response: Purposeful movement;Responds to commands (08/19/19 1543) RLE Sensation: Pain;Decreased (08/19/19 1543)      Effie Berkshire

## 2019-08-19 NOTE — Anesthesia Preprocedure Evaluation (Addendum)
Anesthesia Evaluation  Patient identified by MRN, date of birth, ID band Patient awake    Reviewed: Allergy & Precautions, Patient's Chart, lab work & pertinent test results  Airway Mallampati: II  TM Distance: >3 FB Neck ROM: Full    Dental  (+) Missing, Dental Advisory Given, Loose,    Pulmonary sleep apnea ,    breath sounds clear to auscultation       Cardiovascular hypertension, + CAD and + Peripheral Vascular Disease  + dysrhythmias Atrial Fibrillation  Rhythm:Regular Rate:Normal     Neuro/Psych PSYCHIATRIC DISORDERS Anxiety Depression Dementia negative neurological ROS     GI/Hepatic negative GI ROS, Neg liver ROS,   Endo/Other  diabetes  Renal/GU Renal disease     Musculoskeletal negative musculoskeletal ROS (+)   Abdominal Normal abdominal exam  (+)   Peds  Hematology negative hematology ROS (+)   Anesthesia Other Findings   Reproductive/Obstetrics                            Anesthesia Physical Anesthesia Plan  ASA: III  Anesthesia Plan: General   Post-op Pain Management:    Induction: Intravenous  PONV Risk Score and Plan: 4 or greater and Dexamethasone, Ondansetron and Treatment may vary due to age or medical condition  Airway Management Planned: Oral ETT  Additional Equipment: None  Intra-op Plan:   Post-operative Plan: Extubation in OR  Informed Consent: I have reviewed the patients History and Physical, chart, labs and discussed the procedure including the risks, benefits and alternatives for the proposed anesthesia with the patient or authorized representative who has indicated his/her understanding and acceptance.     Dental advisory given  Plan Discussed with: CRNA  Anesthesia Plan Comments: (Pt AOx3, expressed understanding of the risks of anesthesia and the procedure. )        Anesthesia Quick Evaluation

## 2019-08-19 NOTE — Progress Notes (Signed)
Pt to OR.

## 2019-08-19 NOTE — Discharge Instructions (Signed)
Dr. Rod Can Adult Hip & Knee Specialist [facility] [address], [place], [place] [address] [phone]   POSTOPERATIVE DIRECTIONS    Hip Rehabilitation, Guidelines Following Surgery   WEIGHT BEARING Partial weight bearing with assist device as directed.  TDWB RLE   HOME CARE INSTRUCTIONS  Remove items at home which could result in a fall. This includes throw rugs or furniture in walking pathways.  Continue medications as instructed at time of discharge.  You may have some home medications which will be placed on hold until you complete the course of blood thinner medication.  4 days after discharge, you may start showering. No tub baths or soaking your incisions. Do not put on socks or shoes without following the instructions of your caregivers.   Sit on chairs with arms. Use the chair arms to help push yourself up when arising.  Arrange for the use of a toilet seat elevator so you are not sitting low.   Walk with walker as instructed.  You may resume a sexual relationship in one month or when given the OK by your caregiver.  Use walker as long as suggested by your caregivers.  Avoid periods of inactivity such as sitting longer than an hour when not asleep. This helps prevent blood clots.  You may return to work once you are cleared by Engineer, production.  Do not drive a car for 6 weeks or until released by your surgeon.  Do not drive while taking narcotics.  Wear elastic stockings for two weeks following surgery during the day but you may remove then at night.  Make sure you keep all of your appointments after your operation with all of your doctors and caregivers. You should call the office at the above phone number and make an appointment for approximately two weeks after the date of your surgery. Please pick up a stool softener and laxative for home use as long as you are requiring pain medications.  ICE to the affected hip every three hours  for 30 minutes at a time and then as needed for pain and swelling. Continue to use ice on the hip for pain and swelling from surgery. You may notice swelling that will progress down to the foot and ankle.  This is normal after surgery.  Elevate the leg when you are not up walking on it.   It is important for you to complete the blood thinner medication as prescribed by your doctor.  Continue to use the breathing machine which will help keep your temperature down.  It is common for your temperature to cycle up and down following surgery, especially at night when you are not up moving around and exerting yourself.  The breathing machine keeps your lungs expanded and your temperature down.  RANGE OF MOTION AND STRENGTHENING EXERCISES  These exercises are designed to help you keep full movement of your hip joint. Follow your caregiver's or physical therapist's instructions. Perform all exercises about fifteen times, three times per day or as directed. Exercise both hips, even if you have had only one joint replacement. These exercises can be done on a training (exercise) mat, on the floor, on a table or on a bed. Use whatever works the best and is most comfortable for you. Use music or television while you are exercising so that the exercises are a pleasant break in your day. This will make your life better with the exercises acting as a break in routine you can look  forward to.  Lying on your back, slowly slide your foot toward your buttocks, raising your knee up off the floor. Then slowly slide your foot back down until your leg is straight again.  Lying on your back spread your legs as far apart as you can without causing discomfort.  Lying on your side, raise your upper leg and foot straight up from the floor as far as is comfortable. Slowly lower the leg and repeat.  Lying on your back, tighten up the muscle in the front of your thigh (quadriceps muscles). You can do this by keeping your leg straight and  trying to raise your heel off the floor. This helps strengthen the largest muscle supporting your knee.  Lying on your back, tighten up the muscles of your buttocks both with the legs straight and with the knee bent at a comfortable angle while keeping your heel on the floor.   SKILLED REHAB INSTRUCTIONS: If the patient is transferred to a skilled rehab facility following release from the hospital, a list of the current medications will be sent to the facility for the patient to continue.  When discharged from the skilled rehab facility, please have the facility set up the patient's [place] prior to being released. Also, the skilled facility will be responsible for providing the patient with their medications at time of release from the facility to include their pain medication and their blood thinner medication. If the patient is still at the rehab facility at time of the two week follow up appointment, the skilled rehab facility will also need to assist the patient in arranging follow up appointment in our office and any transportation needs.  MAKE SURE YOU:  Understand these instructions.  Will watch your condition.  Will get help right away if you are not doing well or get worse.  Pick up stool softner and laxative for home use following surgery while on pain medications. Daily dry dressing changes as needed. In 4 days, you may remove your dressings and begin taking showers - no tub baths or soaking the incisions. Continue to use ice for pain and swelling after surgery. Do not use any lotions or creams on the incision until instructed by your surgeon.

## 2019-08-19 NOTE — Progress Notes (Signed)
PROGRESS NOTE    Jenny Glover  UXN:[id number] DOB: [birth year] DOA: 08/15/2019 PCP: Reynold Bowen, MD   Brief Narrative:  Jenny Vecchiarelli Tuckeris a 73 y.o.femalewithhistory of A. fib, CAD, diabetes mellitus, hypertension, dementia was brought to the ER the patient had a fall at home but denies hitting her head also incontinence but denies any chest pain or shortness of breath after the fall had right hip pain and was brought to the ER.  ED Course: x-ray showed right hip fracture and on-call orthopedic surgeon Dr. Doran Durand has been consulted. Patient's last dose of Xarelto was this morning. Patient's labs show acute renal failure with anemia with mild hyperkalemia. EKG shows A. fib rate controlled.  COVID-19 test is negative. Patient admitted for further management.    Assessment & Plan:   Principal Problem:   Closed right hip fracture, initial encounter ([place]) Active Problems:   DM (diabetes mellitus) with peripheral vascular complication (HCC)   Hypertensive heart disease   Chronic atrial fibrillation   CAD (coronary artery disease)   Senile dementia, uncomplicated (HCC)   OSA on CPAP   Aortic stenosis   ARF (acute renal failure) (HCC)   Pressure injury of skin   Right hip fracture status post mechanical fall Remained stable but continues to complain of pain with any movement in bed. -Surgery was canceled yesterday, 12/4, due to anemia, acute renal failure.  -Orthopedic team consulted, and following pending anticipated ORIF  Patient last dose of Xarelto was around 10 AM in the morning of 08/15/2019.  -Cleared for surgery on 08/16/2019 at 10 AM.  -Recommending with surgery in today if remained hemodynamically stable, stable H&H, improved kidney function. HgB 7.3>9.8. Cr 2.54>1.61.     A. fib  holding Xarelto due to anticipation of surgery. Family and patient aware. Agreeable.  On Toprol for rate control, HR stable.   Acute renal sufficiency with  hyperkalemia -Likely acute tubular necrosis due to hypotension, and acute anemia. -Monitoring kidney function closely -Monitoring BUN/creatinine closely... Elevated BUN/creatinine from baseline - Cr.  3.8--> 4.18 >>> 2.54>>1.61.  -Per patient had a history of renal sufficiency in the past,  -We will continue IV fluid hydration -We will avoid all nephrotoxins -Appreciate nephrology following and recommendations    Acute on chronic normocytic normochromic anemia  -likely worsening due to right hip hematoma secondary to fracture on Xarelto, HgB improved 12/5  - Hemoglobin 8.1 >>> 6.5  >> s/p 2 u PRBS transfusion >> 7.7>>7.2>>>s/p 2u PRBC>>9.8.  -Monitoring H&H closely  Moderate aortic stenosis per 2D echo done in 2019. Presently asymptomatic.  CAD denies any chest pain. On Toprol. Presently n.p.o.  Hypertension -BPs normotensive  Continue IV fluid hydration we will keep patient on Toprol and as needed IV hydralazine.  Dementia on Namenda to be continued after surgery.   Diabetes mellitus II -on Lantus 25u. Presently I kept patient on sliding scale coverage and will decide Lantus dose based on the CBG and resume full dose once patient starts eating.  COVID-19 test was negative.     DVT prophylaxis: Xarelto held anticipating surgery  Code Status: FULL  Family Communication:  No family at bedside  Disposition Plan: Pending surgery and post-operative course   Consultants:   Orthopedics   Nephrology   Procedures:   Anticipated ORIF   Antimicrobials:   None    Subjective: Patient reports she is feeling well. Tenderness of hip, but pain otherwise well controlled.   Review of Systems Otherwise negative except as per HPI, including: General:  Denies fever, chills, night sweats or unintended weight loss. Resp: Denies cough, wheezing, shortness of breath. Cardiac: Denies chest pain, palpitations, orthopnea, paroxysmal nocturnal dyspnea. GI: Denies  abdominal pain, nausea, vomiting, diarrhea or constipation GU: Denies dysuria, frequency, hesitancy or incontinence MS: Denies muscle aches, joint pain or swelling Neuro: Denies headache, neurologic deficits (focal weakness, numbness, tingling), abnormal gait Psych: Denies anxiety, depression, SI/HI/AVH Skin: Denies new rashes or lesions ID: Denies sick contacts, exotic exposures, travel  Objective: Vitals:   08/18/19 1846 08/18/19 1952 08/18/19 2100 08/19/19 0305  BP: 131/61 (!) 148/53 (!) 141/68 105/62  Pulse: 61 84 64 70  Resp: 16  14   Temp: 98.1 F (36.7 C) 98.2 F (36.8 C) 99.2 F (37.3 C) 98.4 F (36.9 C)  TempSrc: Oral Oral Axillary Oral  SpO2: 95% 97% 95% 96%    Intake/Output Summary (Last 24 hours) at 08/19/2019 0736 Last data filed at 08/19/2019 0300 Gross per 24 hour  Intake 4.33 ml  Output 1500 ml  Net -1495.67 ml   There were no vitals filed for this visit.  Examination:  General exam: Appears calm and comfortable  Respiratory system: Clear to auscultation. Respiratory effort normal. Cardiovascular system: Irregularly irregular. 2+ pedal pulses.  Gastrointestinal system: Abdomen is nondistended, soft and nontender. No organomegaly or masses felt. Normal bowel sounds heard. Central nervous system: Alert and oriented. No focal neurological deficits. Extremities: RLE externally rotated. Edema in the thigh and hip. Able to wiggle toes.  Skin: No rashes, lesions or ulcers Psychiatry: Slowed mentation but answers questions and engages appropriately.     Data Reviewed:   CBC: Recent Labs  Lab 08/15/19 2120 08/16/19 1045 08/17/19 1100 08/17/19 1900 08/18/19 0332 08/18/19 1350 08/19/19 0347  WBC 17.8* 13.3* 16.9*  --  14.3*  --  12.6*  NEUTROABS 15.2* 10.0* 12.5*  --   --   --   --   HGB 10.9* 8.1* 6.5* 7.7* 7.2* 7.3* 9.4*  HCT 36.6 27.0* 21.0* 23.7* 21.7* 22.0* 28.9*  MCV 95.1 94.1 92.9  --  89.3  --  90.0  PLT 342 288 275  --  211  --  025   Basic  Metabolic Panel: Recent Labs  Lab 08/16/19 1045 08/16/19 2122 08/17/19 1100 08/18/19 0332 08/19/19 0347  NA 134* 134* 136 135 140  K 5.6* 5.9* 4.8 4.3 4.3  CL 102 106 105 105 110  CO2 20* 19* 20* 22 23  GLUCOSE 408* 418* 352* 281* 173*  BUN 50* 61* 66* 57* 45*  CREATININE 3.47* 4.18* 3.68* 2.54* 1.61*  CALCIUM 8.6* 8.0* 8.7* 8.4* 8.6*  PHOS  --   --   --  3.2 2.1*   GFR: Estimated Creatinine Clearance: 37.8 mL/min (A) (by C-G formula based on SCr of 1.61 mg/dL (H)). Liver Function Tests: Recent Labs  Lab 08/15/19 2120 08/18/19 0332 08/19/19 0347  AST 22  --   --   ALT 12  --   --   ALKPHOS 105  --   --   BILITOT 0.6  --   --   PROT 7.0  --   --   ALBUMIN 3.1* 2.5* 2.4*   No results for input(s): LIPASE, AMYLASE in the last 168 hours. No results for input(s): AMMONIA in the last 168 hours. Coagulation Profile: Recent Labs  Lab 08/17/19 1155 08/19/19 0347  INR 1.5* 1.2   Cardiac Enzymes: No results for input(s): CKTOTAL, CKMB, CKMBINDEX, TROPONINI in the last 168 hours. BNP (last 3 results) No results  for input(s): PROBNP in the last 8760 hours. HbA1C: No results for input(s): HGBA1C in the last 72 hours. CBG: Recent Labs  Lab 08/18/19 1159 08/18/19 1606 08/18/19 1947 08/19/19 0008 08/19/19 0332  GLUCAP 322* 377* 307* 214* 165*   Lipid Profile: No results for input(s): CHOL, HDL, LDLCALC, TRIG, CHOLHDL, LDLDIRECT in the last 72 hours. Thyroid Function Tests: No results for input(s): TSH, T4TOTAL, FREET4, T3FREE, THYROIDAB in the last 72 hours. Anemia Panel: No results for input(s): VITAMINB12, FOLATE, FERRITIN, TIBC, IRON, RETICCTPCT in the last 72 hours. Sepsis Labs: No results for input(s): PROCALCITON, LATICACIDVEN in the last 168 hours.  Recent Results (from the past 240 hour(s))  SARS CORONAVIRUS 2 (TAT 6-24 HRS) Nasopharyngeal Nasopharyngeal Swab     Status: None   Collection Time: 08/15/19  8:42 PM   Specimen: Nasopharyngeal Swab  Result  Value Ref Range Status   SARS Coronavirus 2 NEGATIVE NEGATIVE Final    Comment: (NOTE) SARS-CoV-2 target nucleic acids are NOT DETECTED. The SARS-CoV-2 RNA is generally detectable in upper and lower respiratory specimens during the acute phase of infection. Negative results do not preclude SARS-CoV-2 infection, do not rule out co-infections with other pathogens, and should not be used as the sole basis for treatment or other patient management decisions. Negative results must be combined with clinical observations, patient history, and epidemiological information. The expected result is Negative. Fact Sheet for Patients: SugarRoll.be Fact Sheet for Healthcare Providers: https://www.woods-mathews.com/ This test is not yet approved or cleared by the [place] FDA and  has been authorized for detection and/or diagnosis of SARS-CoV-2 by FDA under an Emergency Use Authorization (EUA). This EUA will remain  in effect (meaning this test can be used) for the duration of the COVID-19 declaration under Section 56 4(b)(1) of the Act, 21 U.S.C. section 360bbb-3(b)(1), unless the authorization is terminated or revoked sooner. Performed at [facility], [place] [address] [place], [place] [address]   MRSA PCR Screening     Status: None   Collection Time: 08/16/19  8:06 PM   Specimen: Nasal Mucosa; Nasopharyngeal  Result Value Ref Range Status   MRSA by PCR NEGATIVE NEGATIVE Final    Comment:        The GeneXpert MRSA Assay (FDA approved for NASAL specimens only), is one component of a comprehensive MRSA colonization surveillance program. It is not intended to diagnose MRSA infection nor to guide or monitor treatment for MRSA infections. Performed at [facility], [place] [address], [place], [place] [address]   Urine culture     Status: Abnormal (Preliminary result)   Collection Time: 08/18/19 12:36 AM   Specimen: Urine, Clean Catch   Result Value Ref Range Status   Specimen Description URINE, CLEAN CATCH  Final   Special Requests NONE  Final   Culture (A)  Final    >=100,000 COLONIES/mL GRAM NEGATIVE RODS IDENTIFICATION AND SUSCEPTIBILITIES TO FOLLOW Performed at [facility], [address], [place], [place] [address]    Report Status PENDING  Incomplete         Radiology Studies: US Renal  Result Date: 08/17/2019 CLINICAL DATA:  Acute renal failure EXAM: RENAL / URINARY TRACT ULTRASOUND COMPLETE COMPARISON:  Ultrasound 05/12/2013 FINDINGS: Right Kidney: Renal measurements: 10.3 x 4.4 x 4.7 cm = volume: 112 mL . Echogenicity within normal limits. No mass or hydronephrosis visualized. Left Kidney: Renal measurements: 11.0 x 6.4 x 5.2 cm = volume: 192 mL. Echogenicity within normal limits. Redemonstration of a  simple appearing renal sinus cyst measuring 3.6 x 3.2 x 3.7 cm. No solid mass or hydronephrosis visualized. Bladder: Appears normal for degree of bladder distention. Other: None. IMPRESSION: 1. Negative for obstructive uropathy. 2. 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 Electronically Signed   By: Davina Poke M.D.   On: 08/17/2019 12:00        Scheduled Meds: . feeding supplement (GLUCERNA SHAKE)  237 mL Oral TID BM  . insulin aspart  0-9 Units Subcutaneous Q4H  . insulin glargine  25 Units Subcutaneous BID  . metoprolol succinate  100 mg Oral Daily  . multivitamin with minerals  1 tablet Oral Daily  . povidone-iodine  2 application Topical Once   Continuous Infusions:   LOS: 4 days   Time spent= 25 mins    Melina Schools, DO [facility]  If 7PM-7AM, please contact night-coverage  08/19/2019, 7:36 AM

## 2019-08-19 NOTE — Progress Notes (Signed)
Orthopedic Tech Progress Note Patient Details:  Jenny Glover 1946/03/31 [id number] I applied an Over Head Frame with Trapeze for patient Patient ID: Jenny Glover, female   DOB: [birth year], 73 y.o.   MRN: [id number]   Janit Pagan 08/19/2019, 3:40 PM

## 2019-08-19 NOTE — Op Note (Signed)
OPERATIVE REPORT  SURGEON: Rod Can, MD   ASSISTANT: Griffith Citron, PA-C.  PREOPERATIVE DIAGNOSIS: Comminuted Right intertrochanteric femur fracture.   POSTOPERATIVE DIAGNOSIS: Comminuted Right intertrochanteric femur fracture.   PROCEDURE: Intramedullary fixation, Right femur.   IMPLANTS: Stryker Gamma3 Hip Fracture Nail, 10 by 400 mm, 125 degrees. 10.5 x 110 mm Hip Fracture Nail Lag Screw. 5 x 52.5 mm distal interlocking screw 1.  ANESTHESIA:  General  ESTIMATED BLOOD LOSS:-400 mL    ANTIBIOTICS: 2 g Ancef.  DRAINS: None.  COMPLICATIONS: None.   CONDITION: PACU - hemodynamically stable.Marland Kitchen   BRIEF CLINICAL NOTE: Jenny Glover is a 73 y.o. female who presented with an intertrochanteric femur fracture. The patient was admitted to the hospitalist service and underwent perioperative risk stratification and medical optimization. The risks, benefits, and alternatives to the procedure were explained, and the patient elected to proceed.  PROCEDURE IN DETAIL: Surgical site was marked by myself. The patient was taken to the operating room and anesthesia was induced on the bed. The patient was then transferred to the [facility] table and the nonoperative lower extremity was scissored underneath the operative side. The fracture was reduced with traction, internal rotation, and adduction.  Of note, the fracture was comminuted.  The greater trochanter was fractured.  The calcar/lesser trochanter was fractured.  With appropriate positioning and traction, the femoral neck fragment was flexed up apex anterior, and the overall fracture alignment was in significant varus.  The hip was prepped and draped in the normal sterile surgical fashion. Timeout was called verifying side and site of surgery. Preop antibiotics were given with 60 minutes of beginning the procedure.  Fluoroscopy was used to define the patient's anatomy.  I created a 4 cm incision longitudinally over the proximal trochanter.  The IT  band was incised.  I placed a Cobb retractor subperiosteally up onto the femoral neck in order to reverse the apex anterior angulation.  Improved reduction on the lateral view was confirmed with fluoroscopy.  Please note that the PA was required to hold this for the duration of the case.  A 4 cm incision was made just proximal to the tip of the greater trochanter. The awl was used to obtain the standard starting point for a trochanteric entry nail under fluoroscopic control.  Obtaining a start point was challenging, because the greater trochanter fragment was not attached to the femoral shaft.  The guidepin was placed. The entry reamer was used to open the proximal femur.  I placed the guidewire to the level of the physeal scar of the knee. I measured the length of the guidewire. Sequential reaming was performed up to a size 11.5 mm with excellent chatter. Therefore, a size 10 by 400 mm nail was selected and assembled to the jig on the back table. The nail was placed without any difficulty. Through a separate stab incision, the cannula was placed down to the bone in preparation for the cephalomedullary device.  A laterally directed force on the proximal femur was placed in order to improve the varus angulation.  Additional traction was added as well.  A guidepin was placed into the femoral head using AP and lateral fluoroscopy views. The pin was measured, and then reaming was performed to the appropriate depth. The lag screw was inserted to the appropriate depth. The fracture was compressed through the jig. The setscrew was tightened statically due to severe comminution of the fracture. Using perfect circle technique, a distal interlocking screw was placed. The jig was removed. Final  AP and lateral fluoroscopy views were obtained to confirm fracture reduction and hardware placement. Tip apex distance was appropriate. There was no chondral penetration.  The wounds were copiously irrigated with saline. The wound  was closed in layers with #1 Vicryl for the fascia, 2-0 Monocryl for the deep dermal layer, and staples. Glue was applied to the skin. Once the glue was fully hardened, sterile dressing was applied. The patient was then awakened from anesthesia and taken to the PACU in stable condition. Sponge needle and instrument counts were correct at the end of the case 2. There were no known complications.  Please note that a surgical assistant was a medical necessity for this procedure to perform it in a safe and expeditious manner. Assistant was necessary to provide appropriate retraction of vital neurovascular structures, to prevent femoral fracture, and to allow for anatomic placement of the prosthesis.  POSTOPERATIVE PLAN: We will readmit the patient to the hospitalist. Weightbearing status will be touchdown weightbearing with a walker. We will resume Xarelto tomorrow morning at 10 mg daily; after 48-72 hours, the normal dose can be given. The patient will work with physical therapy and undergo disposition planning.

## 2019-08-19 NOTE — Interval H&P Note (Signed)
History and Physical Interval Note:  08/19/2019 10:21 AM  Jenny Glover  has presented today for surgery, with the diagnosis of right hip fracture.  The various methods of treatment have been discussed with the patient and family. After consideration of risks, benefits and other options for treatment, the patient has consented to  Procedure(s): INTRAMEDULLARY (IM) NAIL INTERTROCHANTRIC (Right) as a surgical intervention.  The patient's history has been reviewed, patient examined, no change in status, stable for surgery.  I have reviewed the patient's chart and labs.  Questions were answered to the patient's satisfaction.    The risks, benefits, and alternatives were discussed with the patient. There are risks associated with the surgery including, but not limited to, problems with anesthesia (death), infection, differences in leg length/angulation/rotation, fracture of bones, loosening or failure of implants, malunion, nonunion, hematoma (blood accumulation) which may require surgical drainage, blood clots, pulmonary embolism, nerve injury (foot drop), and blood vessel injury. The patient understands these risks and elects to proceed.    Hilton Cork Zuly Belkin

## 2019-08-19 NOTE — Anesthesia Procedure Notes (Signed)
Procedure Name: Intubation Date/Time: 08/19/2019 12:09 PM Performed by: Griffin Dakin, CRNA Pre-anesthesia Checklist: Patient identified, Emergency Drugs available, Suction available and Patient being monitored Patient Re-evaluated:Patient Re-evaluated prior to induction Oxygen Delivery Method: Circle system utilized Preoxygenation: Pre-oxygenation with 100% oxygen Induction Type: IV induction Ventilation: Mask ventilation without difficulty Laryngoscope Size: Mac and 3 Grade View: Grade I Tube type: Oral Tube size: 7.0 mm Number of attempts: 1 Airway Equipment and Method: Stylet Placement Confirmation: ETT inserted through vocal cords under direct vision,  positive ETCO2 and breath sounds checked- equal and bilateral Secured at: 21 cm Tube secured with: Tape Dental Injury: Teeth and Oropharynx as per pre-operative assessment

## 2019-08-20 DIAGNOSIS — F0390 Unspecified dementia without behavioral disturbance: Secondary | ICD-10-CM

## 2019-08-20 DIAGNOSIS — N1832 Chronic kidney disease, stage 3b: Secondary | ICD-10-CM

## 2019-08-20 DIAGNOSIS — E118 Type 2 diabetes mellitus with unspecified complications: Secondary | ICD-10-CM

## 2019-08-20 DIAGNOSIS — S72001A Fracture of unspecified part of neck of right femur, initial encounter for closed fracture: Secondary | ICD-10-CM | POA: Diagnosis not present

## 2019-08-20 DIAGNOSIS — E1165 Type 2 diabetes mellitus with hyperglycemia: Secondary | ICD-10-CM

## 2019-08-20 DIAGNOSIS — S72141A Displaced intertrochanteric fracture of right femur, initial encounter for closed fracture: Secondary | ICD-10-CM | POA: Diagnosis not present

## 2019-08-20 DIAGNOSIS — I350 Nonrheumatic aortic (valve) stenosis: Secondary | ICD-10-CM | POA: Diagnosis not present

## 2019-08-20 DIAGNOSIS — N179 Acute kidney failure, unspecified: Secondary | ICD-10-CM | POA: Diagnosis not present

## 2019-08-20 LAB — BPAM RBC
Blood Product Expiration Date: 202012102359
Blood Product Expiration Date: 202012102359
Blood Product Expiration Date: 202012112359
Blood Product Expiration Date: 202012112359
Blood Product Expiration Date: 202012112359
Blood Product Expiration Date: 202012272359
ISSUE DATE / TIME: 202012031156
ISSUE DATE / TIME: 202012031528
ISSUE DATE / TIME: 202012041417
ISSUE DATE / TIME: 202012041815
Unit Type and Rh: 1700
Unit Type and Rh: 1700
Unit Type and Rh: 1700
Unit Type and Rh: 1700
Unit Type and Rh: 1700
Unit Type and Rh: 1700

## 2019-08-20 LAB — TYPE AND SCREEN
ABO/RH(D): B NEG
Antibody Screen: NEGATIVE
Unit division: 0
Unit division: 0
Unit division: 0
Unit division: 0
Unit division: 0
Unit division: 0

## 2019-08-20 LAB — BASIC METABOLIC PANEL
Anion gap: 6 (ref 5–15)
BUN: 45 mg/dL — ABNORMAL HIGH (ref 8–23)
CO2: 24 mmol/L (ref 22–32)
Calcium: 8.3 mg/dL — ABNORMAL LOW (ref 8.9–10.3)
Chloride: 107 mmol/L (ref 98–111)
Creatinine, Ser: 1.56 mg/dL — ABNORMAL HIGH (ref 0.44–1.00)
GFR calc Af Amer: 38 mL/min — ABNORMAL LOW (ref >60)
GFR calc non Af Amer: 33 mL/min — ABNORMAL LOW (ref >60)
Glucose, Bld: 362 mg/dL — ABNORMAL HIGH (ref 70–99)
Potassium: 5.6 mmol/L — ABNORMAL HIGH (ref 3.5–5.1)
Sodium: 137 mmol/L (ref 135–145)

## 2019-08-20 LAB — HEMOGLOBIN A1C
Hgb A1c MFr Bld: 6.4 % — ABNORMAL HIGH (ref 4.8–5.6)
Mean Plasma Glucose: 136.98 mg/dL

## 2019-08-20 LAB — GLUCOSE, CAPILLARY
Glucose-Capillary: 163 mg/dL — ABNORMAL HIGH (ref 70–99)
Glucose-Capillary: 252 mg/dL — ABNORMAL HIGH (ref 70–99)
Glucose-Capillary: 323 mg/dL — ABNORMAL HIGH (ref 70–99)
Glucose-Capillary: 324 mg/dL — ABNORMAL HIGH (ref 70–99)
Glucose-Capillary: 330 mg/dL — ABNORMAL HIGH (ref 70–99)
Glucose-Capillary: 339 mg/dL — ABNORMAL HIGH (ref 70–99)
Glucose-Capillary: 360 mg/dL — ABNORMAL HIGH (ref 70–99)

## 2019-08-20 LAB — URINE CULTURE: Culture: >=100000 — AB

## 2019-08-20 LAB — CBC
HCT: 26.2 % — ABNORMAL LOW (ref 36.0–46.0)
Hemoglobin: 8.5 g/dL — ABNORMAL LOW (ref 12.0–15.0)
MCH: 29.6 pg (ref 26.0–34.0)
MCHC: 32.4 g/dL (ref 30.0–36.0)
MCV: 91.3 fL (ref 80.0–100.0)
Platelets: 202 10*3/uL (ref 150–400)
RBC: 2.87 MIL/uL — ABNORMAL LOW (ref 3.87–5.11)
RDW: 14.7 % (ref 11.5–15.5)
WBC: 9.8 10*3/uL (ref 4.0–10.5)
nRBC: 0.2 % (ref 0.0–0.2)

## 2019-08-20 LAB — LIPID PANEL
Cholesterol: 107 mg/dL (ref 0–200)
HDL: 27 mg/dL — ABNORMAL LOW (ref >40)
LDL Cholesterol: 59 mg/dL (ref 0–99)
Total CHOL/HDL Ratio: 4 RATIO
Triglycerides: 106 mg/dL (ref <150)
VLDL: 21 mg/dL (ref 0–40)

## 2019-08-20 MED ORDER — MEMANTINE HCL 5 MG PO TABS
5.0000 mg | ORAL_TABLET | Freq: Two times a day (BID) | ORAL | Status: DC
  Administered 2019-08-20 – 2019-08-23 (×7): 5 mg via ORAL
  Filled 2019-08-20 (×8): qty 1

## 2019-08-20 MED ORDER — INSULIN GLARGINE 100 UNIT/ML ~~LOC~~ SOLN
30.0000 [IU] | Freq: Two times a day (BID) | SUBCUTANEOUS | Status: DC
  Administered 2019-08-20 – 2019-08-21 (×3): 30 [IU] via SUBCUTANEOUS
  Filled 2019-08-20 (×6): qty 0.3

## 2019-08-20 MED ORDER — SODIUM ZIRCONIUM CYCLOSILICATE 10 G PO PACK
10.0000 g | PACK | Freq: Two times a day (BID) | ORAL | Status: DC
  Administered 2019-08-20 (×2): 10 g via ORAL
  Filled 2019-08-20 (×2): qty 1

## 2019-08-20 MED ORDER — INSULIN ASPART 100 UNIT/ML ~~LOC~~ SOLN
0.0000 [IU] | SUBCUTANEOUS | Status: DC
  Administered 2019-08-20: 8 [IU] via SUBCUTANEOUS
  Administered 2019-08-20: 15 [IU] via SUBCUTANEOUS
  Administered 2019-08-21: 3 [IU] via SUBCUTANEOUS
  Administered 2019-08-21: 2 [IU] via SUBCUTANEOUS
  Administered 2019-08-21: 15 [IU] via SUBCUTANEOUS
  Administered 2019-08-21 (×2): 3 [IU] via SUBCUTANEOUS
  Administered 2019-08-21 – 2019-08-22 (×2): 11 [IU] via SUBCUTANEOUS
  Administered 2019-08-22: 3 [IU] via SUBCUTANEOUS
  Administered 2019-08-22: 8 [IU] via SUBCUTANEOUS
  Administered 2019-08-22 – 2019-08-23 (×2): 3 [IU] via SUBCUTANEOUS
  Administered 2019-08-23: 2 [IU] via SUBCUTANEOUS
  Administered 2019-08-23: 8 [IU] via SUBCUTANEOUS
  Administered 2019-08-23 (×2): 2 [IU] via SUBCUTANEOUS

## 2019-08-20 NOTE — Plan of Care (Signed)

## 2019-08-20 NOTE — Progress Notes (Signed)
PROGRESS NOTE    Jenny Glover  NFA:[id number] DOB: 25-Jul-1946 DOA: 08/15/2019 PCP: Reynold Bowen, MD     Brief Narrative:  73 y.o.WF PMHx dementia, A. fib, CAD, HTN, diabetes type 2 uncontrolled with complication,  was brought to the ER the patient had a fall at home but denies hitting her head also incontinence but denies any chest pain or shortness of breath after the fall had right hip pain and was brought to the ER.  ED Course: x-ray showed right hip fracture and on-call orthopedic surgeon Dr. Doran Durand has been consulted. Patient's last dose of Xarelto was this morning. Patient's labs show acute renal failure with anemia with mild hyperkalemia. EKG shows A. fib rate controlled.  COVID-19 test is negative. Patient admitted for further management.   Subjective: 24 hours afebrile A/O x4, but confused.  Does not recall that she had surgery yesterday nor does she recall speaking to the surgeon this morning.    Assessment & Plan:   Principal Problem:   Closed right hip fracture, initial encounter ([place]) Active Problems:   DM (diabetes mellitus) with peripheral vascular complication (HCC)   Hypertensive heart disease   Chronic atrial fibrillation   CAD (coronary artery disease)   Senile dementia, uncomplicated (HCC)   OSA on CPAP   Aortic stenosis   ARF (acute renal failure) (HCC)   Pressure injury of skin   S/P right hip fracture  RIGHT hip fracture s/p mechanical fall -12/5 s/p implants to fix right hip fracture see procedure below  Atrial fibrillation -Currently NSR -Amlodipine 5 mg daily -Hydralazine PRN -Toprol 100 mg daily -Xarelto 10 mg daily restarted  Essential HTN -See A. fib   CAD -See A. 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 (baselineCr from 02/15/2015= 1.59)  Recent Labs  Lab 08/16/19 2122 08/17/19 1100 08/18/19 0332 08/19/19 0347 08/20/19 0414  CREATININE 4.18* 3.68* 2.54* 1.61*  1.56*  -Previous baseline  Hyperkalemia  -Lokelma 10 g BID x 3 doses  Acute on chronic normocytic normochromic anemia -Transfused 4 units PRBC Recent Labs  Lab 08/17/19 1900 08/18/19 0332 08/18/19 1350 08/19/19 0347 08/20/19 0414  HGB 7.7* 7.2* 7.3* 9.4* 8.5*  -: Stable  Dementia -Namenda 5 mg BID  Diabetes type 2 uncontrolled with complication -Hemoglobin A1c pending -Lipid panel pending -12/6 increase Lantus 30 units BID -12/6 increase moderate SSI  NOTE; COVID-19 test was negative.     DVT prophylaxis: Xarelto Code Status: Full Family Communication: 12/6 husband at bedside counseled on plan of care answered all questions Disposition Plan: TBD   Consultants:  Nephrology    Procedures/Significant Events:  12/5 s/p RIGHT leg IMPLANTS: Stryker Gamma3 Hip Fracture Nail, 10 by 400 mm, 125 degrees. 10.5 x 110 mm Hip Fracture Nail Lag Screw. 5 x 52.5 mm distal interlocking screw 1.  I have personally reviewed and interpreted all radiology studies and my findings are as above.  VENTILATOR SETTINGS:    Cultures   Antimicrobials: Anti-infectives (From admission, onward)   Start     Stop   08/19/19 1045  ceFAZolin (ANCEF) IVPB 2g/100 mL premix     08/19/19 1221   08/19/19 1039  ceFAZolin (ANCEF) 2-4 GM/100ML-% IVPB    Note to Pharmacy: Henrine Screws   : cabinet override   08/19/19 1221   08/17/19 0600  ceFAZolin (ANCEF) IVPB 2g/100 mL premix     08/18/19 0559       Devices    LINES / TUBES:  Continuous Infusions:   Objective: Vitals:   08/19/19 1518 08/19/19 1532 08/19/19 1950 08/20/19 0435  BP:  (!) 145/65 (!) 139/59 (!) 110/58  Pulse: 81 86 76 73  Resp: 18     Temp: 98 F (36.7 C) 97.9 F (36.6 C) 98 F (36.7 C) (!) 97.5 F (36.4 C)  TempSrc:  Oral Oral Oral  SpO2: 100% 100% 100% 100%    Intake/Output Summary (Last 24 hours) at 08/20/2019 0731 Last data filed at 08/19/2019 2200 Gross per 24 hour  Intake 1233.83 ml   Output 1200 ml  Net 33.83 ml   There were no vitals filed for this visit.  Examination:  General: A/O x4, some confusion, positive acute respiratory distress Eyes: negative scleral hemorrhage, negative anisocoria, negative icterus ENT: Negative Runny nose, negative gingival bleeding, Neck:  Negative scars, masses, torticollis, lymphadenopathy, JVD Lungs: Clear to auscultation bilaterally without wheezes or crackles Cardiovascular: Regular rate and rhythm without murmur gallop or rub normal S1 and S2 Abdomen: negative abdominal pain, nondistended, positive soft, bowel sounds, no rebound, no ascites, no appreciable mass Extremities: No significant cyanosis, clubbing, or edema.  RIGHT lower extremity lateral aspect of thigh has surgical dressings in place negative sign of infection or bleeding.  Skin: Negative rashes, lesions, ulcers Psychiatric:  Negative depression, negative anxiety, negative fatigue, negative mania  Central nervous system:  Cranial nerves II through XII intact, tongue/uvula midline, all extremities muscle strength 5/5, sensation intact throughout, negative dysarthria, negative expressive aphasia, negative receptive aphasia.  .     Data Reviewed: Care during the described time interval was provided by me .  I have reviewed this patient's available data, including medical history, events of note, physical examination, and all test results as part of my evaluation.  CBC: Recent Labs  Lab 08/15/19 2120 08/16/19 1045 08/17/19 1100 08/17/19 1900 08/18/19 0332 08/18/19 1350 08/19/19 0347 08/20/19 0414  WBC 17.8* 13.3* 16.9*  --  14.3*  --  12.6* 9.8  NEUTROABS 15.2* 10.0* 12.5*  --   --   --   --   --   HGB 10.9* 8.1* 6.5* 7.7* 7.2* 7.3* 9.4* 8.5*  HCT 36.6 27.0* 21.0* 23.7* 21.7* 22.0* 28.9* 26.2*  MCV 95.1 94.1 92.9  --  89.3  --  90.0 91.3  PLT 342 288 275  --  211  --  212 161   Basic Metabolic Panel: Recent Labs  Lab 08/16/19 2122 08/17/19 1100  08/18/19 0332 08/19/19 0347 08/20/19 0414  NA 134* 136 135 140 137  K 5.9* 4.8 4.3 4.3 5.6*  CL 106 105 105 110 107  CO2 19* 20* 22 23 24   GLUCOSE 418* 352* 281* 173* 362*  BUN 61* 66* 57* 45* 45*  CREATININE 4.18* 3.68* 2.54* 1.61* 1.56*  CALCIUM 8.0* 8.7* 8.4* 8.6* 8.3*  PHOS  --   --  3.2 2.1*  --    GFR: Estimated Creatinine Clearance: 39.1 mL/min (A) (by C-G formula based on SCr of 1.56 mg/dL (H)). Liver Function Tests: Recent Labs  Lab 08/15/19 2120 08/18/19 0332 08/19/19 0347  AST 22  --   --   ALT 12  --   --   ALKPHOS 105  --   --   BILITOT 0.6  --   --   PROT 7.0  --   --   ALBUMIN 3.1* 2.5* 2.4*   No results for input(s): LIPASE, AMYLASE in the last 168 hours. No results for input(s): AMMONIA in the last 168 hours. Coagulation  Profile: Recent Labs  Lab 08/17/19 1155 08/19/19 0347  INR 1.5* 1.2   Cardiac Enzymes: No results for input(s): CKTOTAL, CKMB, CKMBINDEX, TROPONINI in the last 168 hours. BNP (last 3 results) No results for input(s): PROBNP in the last 8760 hours. HbA1C: No results for input(s): HGBA1C in the last 72 hours. CBG: Recent Labs  Lab 08/19/19 1421 08/19/19 1704 08/19/19 1948 08/20/19 0017 08/20/19 0433  GLUCAP 227* 243* 248* 324* 339*   Lipid Profile: No results for input(s): CHOL, HDL, LDLCALC, TRIG, CHOLHDL, LDLDIRECT in the last 72 hours. Thyroid Function Tests: No results for input(s): TSH, T4TOTAL, FREET4, T3FREE, THYROIDAB in the last 72 hours. Anemia Panel: No results for input(s): VITAMINB12, FOLATE, FERRITIN, TIBC, IRON, RETICCTPCT in the last 72 hours. Sepsis Labs: No results for input(s): PROCALCITON, LATICACIDVEN in the last 168 hours.  Recent Results (from the past 240 hour(s))  SARS CORONAVIRUS 2 (TAT 6-24 HRS) Nasopharyngeal Nasopharyngeal Swab     Status: None   Collection Time: 08/15/19  8:42 PM   Specimen: Nasopharyngeal Swab  Result Value Ref Range Status   SARS Coronavirus 2 NEGATIVE NEGATIVE Final     Comment: (NOTE) SARS-CoV-2 target nucleic acids are NOT DETECTED. The SARS-CoV-2 RNA is generally detectable in upper and lower respiratory specimens during the acute phase of infection. Negative results do not preclude SARS-CoV-2 infection, do not rule out co-infections with other pathogens, and should not be used as the sole basis for treatment or other patient management decisions. Negative results must be combined with clinical observations, patient history, and epidemiological information. The expected result is Negative. Fact Sheet for Patients: SugarRoll.be Fact Sheet for Healthcare Providers: https://www.-mathews.com/ This test is not yet approved or cleared by the [place] FDA and  has been authorized for detection and/or diagnosis of SARS-CoV-2 by FDA under an Emergency Use Authorization (EUA). This EUA will remain  in effect (meaning this test can be used) for the duration of the COVID-19 declaration under Section 56 4(b)(1) of the Act, 21 U.S.C. section 360bbb-3(b)(1), unless the authorization is terminated or revoked sooner. Performed at [facility], [place] [address], [place], [place] [address]   MRSA PCR Screening     Status: None   Collection Time: 08/16/19  8:06 PM   Specimen: Nasal Mucosa; Nasopharyngeal  Result Value Ref Range Status   MRSA by PCR NEGATIVE NEGATIVE Final    Comment:        The GeneXpert MRSA Assay (FDA approved for NASAL specimens only), is one component of a comprehensive MRSA colonization surveillance program. It is not intended to diagnose MRSA infection nor to guide or monitor treatment for MRSA infections. Performed at [facility], [place] [address], [place], [place] [address]   Urine culture     Status: Abnormal (Preliminary result)   Collection Time: 08/18/19 12:36 AM   Specimen: Urine, Clean Catch  Result Value Ref Range Status   Specimen Description URINE, CLEAN CATCH   Final   Special Requests NONE  Final   Culture (A)  Final    >=100,000 COLONIES/mL GRAM NEGATIVE RODS IDENTIFICATION AND SUSCEPTIBILITIES TO FOLLOW Performed at [facility], [address], [place],  [address]    Report Status PENDING  Incomplete         Radiology Studies: Pelvis Portable  Result Date: 08/19/2019 CLINICAL DATA:  Post nailing of RIGHT femur EXAM: PORTABLE PELVIS 1-2 VIEWS COMPARISON:  Portable exam 1437 hours compared to 08/15/2019 FINDINGS: IM nail with proximal  compression screw placed across a reduced intertrochanteric fracture of the RIGHT femur. Lesser trochanteric fracture fragment remains displaced. Mild RIGHT hip joint space narrowing. LEFT hip joint and SI joints preserved. No pelvic fracture or bone destruction. IMPRESSION: Post ORIF of intertrochanteric fracture RIGHT femur. Electronically Signed   By: Lavonia Dana M.D.   On: 08/19/2019 14:58   Dg C-arm 1-60 Min  Result Date: 08/19/2019 CLINICAL DATA:  Intramedullary rod fixation of right femur fracture. EXAM: RIGHT FEMUR 2 VIEWS; DG C-ARM 1-60 MIN COMPARISON:  Right femur radiographs-08/15/2019 FLUOROSCOPY TIME:  2 minutes, 27 seconds FINDINGS: Five spot intraoperative fluoroscopic images of the right femur are provided for review. Images demonstrate the sequela of intramedullary rod fixation of the right femur and dynamic screw fixation of the right femoral neck transfixing known comminuted intertrochanteric femur fracture. The distal end of the intramedullary rod is transfixed with a single cancellous screw. Alignment appears near anatomic. There is a minimal amount of subcutaneous emphysema about the operative site. No radiopaque foreign body. Incidentally noted moderate to severe degenerative change involving the medial and lateral compartments of the right knee. IMPRESSION: Post intramedullary rod and dynamic screw fixation of the right femur and femoral neck without evidence of complication.  Electronically Signed   By: Sandi Mariscal M.D.   On: 08/19/2019 14:27   Dg Femur, Min 2 Views Right  Result Date: 08/19/2019 CLINICAL DATA:  Intramedullary rod fixation of right femur fracture. EXAM: RIGHT FEMUR 2 VIEWS; DG C-ARM 1-60 MIN COMPARISON:  Right femur radiographs-08/15/2019 FLUOROSCOPY TIME:  2 minutes, 27 seconds FINDINGS: Five spot intraoperative fluoroscopic images of the right femur are provided for review. Images demonstrate the sequela of intramedullary rod fixation of the right femur and dynamic screw fixation of the right femoral neck transfixing known comminuted intertrochanteric femur fracture. The distal end of the intramedullary rod is transfixed with a single cancellous screw. Alignment appears near anatomic. There is a minimal amount of subcutaneous emphysema about the operative site. No radiopaque foreign body. Incidentally noted moderate to severe degenerative change involving the medial and lateral compartments of the right knee. IMPRESSION: Post intramedullary rod and dynamic screw fixation of the right femur and femoral neck without evidence of complication. Electronically Signed   By: Sandi Mariscal M.D.   On: 08/19/2019 14:27   Dg Femur Port, Min 2 Views Right  Result Date: 08/19/2019 CLINICAL DATA:  Status post right IM nail. EXAM: RIGHT FEMUR PORTABLE 2 VIEW COMPARISON:  Earlier today FINDINGS: Interval open reduction and internal fixation involving the comminuted intertrochanteric fracture involving the proximal right femur. There is been interval placement of a long IM nail with hip screw. The hardware components and fracture fragments are in anatomic alignment. Advanced osteoarthritis is noted involving the right knee. IMPRESSION: 1. Status post ORIF of comminuted intertrochanteric fracture of the proximal right femur. 2. Advanced osteoarthritis of the right knee. Electronically Signed   By: Kerby Moors M.D.   On: 08/19/2019 16:22        Scheduled Meds:   amLODipine  5 mg Oral Daily   atorvastatin  80 mg Oral q1800   cholecalciferol  2,000 Units Oral Daily   docusate sodium  100 mg Oral BID   escitalopram  10 mg Oral Daily   feeding supplement (GLUCERNA SHAKE)  237 mL Oral TID BM   insulin aspart  0-9 Units Subcutaneous Q4H   insulin glargine  25 Units Subcutaneous BID   metoprolol succinate  100 mg Oral Daily   multivitamin  with minerals  1 tablet Oral Daily   rivaroxaban  10 mg Oral Daily   Continuous Infusions:   LOS: 5 days    Time spent:40 min    Shallen Luedke, Geraldo Docker, MD [facility] Pager [phone]  If 7PM-7AM, please contact night-coverage [email or web address] Password [facility] 08/20/2019, 7:31 AM

## 2019-08-20 NOTE — Progress Notes (Addendum)
Patient ID: Jenny Glover, female   DOB: 1946/03/29, 73 y.o.   MRN: [id number] Subjective: 1 Day Post-Op Procedure(s) (LRB): INTRAMEDULLARY (IM) NAIL INTERTROCHANTRIC (Right)    Patient sleeping this am when I arrived.  Woke to speak with me.  Relatively comfortable on this POD#1 No events reported  Objective:   VITALS:   Vitals:   08/20/19 0435 08/20/19 0744  BP: (!) 110/58 126/66  Pulse: 73 68  Resp:  17  Temp: (!) 97.5 F (36.4 C) (!) 97.4 F (36.3 C)  SpO2: 100% 100%    Neurovascular intact Incision: dressings on right hip and thigh C/D/I  LABS Recent Labs    08/18/19 0332 08/18/19 1350 08/19/19 0347 08/20/19 0414  HGB 7.2* 7.3* 9.4* 8.5*  HCT 21.7* 22.0* 28.9* 26.2*  WBC 14.3*  --  12.6* 9.8  PLT 211  --  212 202    Recent Labs    08/18/19 0332 08/19/19 0347 08/20/19 0414  NA 135 140 137  K 4.3 4.3 5.6*  BUN 57* 45* 45*  CREATININE 2.54* 1.61* 1.56*  GLUCOSE 281* 173* 362*    Recent Labs    08/17/19 1155 08/19/19 0347  INR 1.5* 1.2     Assessment/Plan: 1 Day Post-Op Procedure(s) (LRB): INTRAMEDULLARY (IM) NAIL INTERTROCHANTRIC (Right)   Advance diet Up with therapy, TDWB per [facility]'s note Disposition pending therapy evaluation and recs DVT prophylaxis - Xarelto

## 2019-08-20 NOTE — Progress Notes (Signed)
Patient ID: Jenny Glover, female   DOB: [birth year], 73 y.o.   MRN: [id number] [facility] Progress Note   Assessment/ Plan:   1. Acute kidney Injury on chronic kidney disease stage III (baseline creatinine suspected to be around 1.6).  Appears to have likely been from hemodynamically mediated renal injury with acute hypotension/acute blood loss anemia.  Renal function improved with hemodynamic support and currently back to baseline.  She has mild hyperkalemia which I suspect is related to her hyperglycemia as well as increased potassium load from hematoma/recent surgery.  Will sign off at this time and recommend follow-up with her primary care provider for ongoing surveillance of renal function. 2.  Right hip fracture status post intramedullary intertrochanteric nail yesterday.  Appears to have been doing well postoperatively overnight with additional plans noted per orthopedic surgery. 3.  Hypertension: Blood pressures currently under well controlled, continue monitoring. 4.  Hyperkalemia: Mild and likely from hyperglycemia/resorbing hematoma.  Lokelma per primary service.  Subjective:   Reports to be feeling better today, denies any chest pain or shortness of breath.   Objective:   BP 126/66 (BP Location: Right Arm)   Pulse 68   Temp (!) 97.4 F (36.3 C) (Oral)   Resp 17   SpO2 100%   Intake/Output Summary (Last 24 hours) at 08/20/2019 1136 Last data filed at 08/19/2019 2200 Gross per 24 hour  Intake 1233.83 ml  Output 1200 ml  Net 33.83 ml   Weight change:   Physical Exam: Gen: Sitting in recliner, husband at bedside CVS: Pulse regular rhythm, normal rate, S1 and S2 normal Resp: Clear to auscultation, no rales/rhonchi Abd: Soft, obese, nontender Ext: Clean dressing right thigh, trace right lower extremity edema, no left ankle edema  Imaging: Pelvis Portable  Result Date: 08/19/2019 CLINICAL DATA:  Post nailing of RIGHT femur EXAM: PORTABLE PELVIS 1-2 VIEWS  COMPARISON:  Portable exam 1437 hours compared to 08/15/2019 FINDINGS: IM nail with proximal compression screw placed across a reduced intertrochanteric fracture of the RIGHT femur. Lesser trochanteric fracture fragment remains displaced. Mild RIGHT hip joint space narrowing. LEFT hip joint and SI joints preserved. No pelvic fracture or bone destruction. IMPRESSION: Post ORIF of intertrochanteric fracture RIGHT femur. Electronically Signed   By: Lavonia Dana M.D.   On: 08/19/2019 14:58   Dg C-arm 1-60 Min  Result Date: 08/19/2019 CLINICAL DATA:  Intramedullary rod fixation of right femur fracture. EXAM: RIGHT FEMUR 2 VIEWS; DG C-ARM 1-60 MIN COMPARISON:  Right femur radiographs-08/15/2019 FLUOROSCOPY TIME:  2 minutes, 27 seconds FINDINGS: Five spot intraoperative fluoroscopic images of the right femur are provided for review. Images demonstrate the sequela of intramedullary rod fixation of the right femur and dynamic screw fixation of the right femoral neck transfixing known comminuted intertrochanteric femur fracture. The distal end of the intramedullary rod is transfixed with a single cancellous screw. Alignment appears near anatomic. There is a minimal amount of subcutaneous emphysema about the operative site. No radiopaque foreign body. Incidentally noted moderate to severe degenerative change involving the medial and lateral compartments of the right knee. IMPRESSION: Post intramedullary rod and dynamic screw fixation of the right femur and femoral neck without evidence of complication. Electronically Signed   By: Sandi Mariscal M.D.   On: 08/19/2019 14:27   Dg Femur, Min 2 Views Right  Result Date: 08/19/2019 CLINICAL DATA:  Intramedullary rod fixation of right femur fracture. EXAM: RIGHT FEMUR 2 VIEWS; DG C-ARM 1-60 MIN COMPARISON:  Right femur radiographs-08/15/2019 FLUOROSCOPY TIME:  2  minutes, 27 seconds FINDINGS: Five spot intraoperative fluoroscopic images of the right femur are provided for review.  Images demonstrate the sequela of intramedullary rod fixation of the right femur and dynamic screw fixation of the right femoral neck transfixing known comminuted intertrochanteric femur fracture. The distal end of the intramedullary rod is transfixed with a single cancellous screw. Alignment appears near anatomic. There is a minimal amount of subcutaneous emphysema about the operative site. No radiopaque foreign body. Incidentally noted moderate to severe degenerative change involving the medial and lateral compartments of the right knee. IMPRESSION: Post intramedullary rod and dynamic screw fixation of the right femur and femoral neck without evidence of complication. Electronically Signed   By: Sandi Mariscal M.D.   On: 08/19/2019 14:27   Dg Femur Port, Min 2 Views Right  Result Date: 08/19/2019 CLINICAL DATA:  Status post right IM nail. EXAM: RIGHT FEMUR PORTABLE 2 VIEW COMPARISON:  Earlier today FINDINGS: Interval open reduction and internal fixation involving the comminuted intertrochanteric fracture involving the proximal right femur. There is been interval placement of a long IM nail with hip screw. The hardware components and fracture fragments are in anatomic alignment. Advanced osteoarthritis is noted involving the right knee. IMPRESSION: 1. Status post ORIF of comminuted intertrochanteric fracture of the proximal right femur. 2. Advanced osteoarthritis of the right knee. Electronically Signed   By: Kerby Moors M.D.   On: 08/19/2019 16:22    Labs: BMET Recent Labs  Lab 08/15/19 2120 08/16/19 1045 08/16/19 2122 08/17/19 1100 08/18/19 0332 08/19/19 0347 08/20/19 0414  NA 138 134* 134* 136 135 140 137  K 5.3* 5.6* 5.9* 4.8 4.3 4.3 5.6*  CL 105 102 106 105 105 110 107  CO2 21* 20* 19* 20* 22 23 24   GLUCOSE 324* 408* 418* 352* 281* 173* 362*  BUN 36* 50* 61* 66* 57* 45* 45*  CREATININE 2.03* 3.47* 4.18* 3.68* 2.54* 1.61* 1.56*  CALCIUM 8.9 8.6* 8.0* 8.7* 8.4* 8.6* 8.3*  PHOS  --   --    --   --  3.2 2.1*  --    CBC Recent Labs  Lab 08/15/19 2120 08/16/19 1045 08/17/19 1100  08/18/19 0332 08/18/19 1350 08/19/19 0347 08/20/19 0414  WBC 17.8* 13.3* 16.9*  --  14.3*  --  12.6* 9.8  NEUTROABS 15.2* 10.0* 12.5*  --   --   --   --   --   HGB 10.9* 8.1* 6.5*   < > 7.2* 7.3* 9.4* 8.5*  HCT 36.6 27.0* 21.0*   < > 21.7* 22.0* 28.9* 26.2*  MCV 95.1 94.1 92.9  --  89.3  --  90.0 91.3  PLT 342 288 275  --  211  --  212 202   < > = values in this interval not displayed.    Medications:    . amLODipine  5 mg Oral Daily  . atorvastatin  80 mg Oral q1800  . cholecalciferol  2,000 Units Oral Daily  . docusate sodium  100 mg Oral BID  . escitalopram  10 mg Oral Daily  . feeding supplement (GLUCERNA SHAKE)  237 mL Oral TID BM  . insulin aspart  0-9 Units Subcutaneous Q4H  . insulin glargine  25 Units Subcutaneous BID  . metoprolol succinate  100 mg Oral Daily  . multivitamin with minerals  1 tablet Oral Daily  . rivaroxaban  10 mg Oral Daily  . sodium zirconium cyclosilicate  10 g Oral BID   Elmarie Shiley, MD 08/20/2019, 11:36  AM

## 2019-08-20 NOTE — Evaluation (Addendum)
Physical Therapy Evaluation Patient Details Name: Jenny Glover MRN: [id number] DOB: [birth year] Today's Date: 08/20/2019   History of Present Illness  Jenny Glover is a 73 y.o. female with history of A. fib, CAD, diabetes mellitus, hypertension, dementia was brought to the ER the patient had a fall at home and R hip pain; Now s/p Intramedullary fixation, Right femur, TDWB RLE; Hospital course also complicated by anemia and acute on chronic kidney injury.  Clinical Impression   Patient is s/p above surgery resulting in functional limitations due to the deficits listed below (see PT Problem List). Recieved in bed and pleasant and ready to get up; she is an unclear historian, and at this point (on 12/6) her PLOF is unknown; Presents to PT with decr functional mobility, difficulty maintaining TWB during upright activities;   Patient will benefit from skilled PT to increase their independence and safety with mobility to allow discharge to the venue listed below.    Addendum: Discussed pt's PLOF with her husband on 12/8; Prior to admission, she was walking independently in the home; has a RW, but would try not to use it; Supervision with ADLs; Will benefit from post-acute rehab to maximize independence and safety with mobility prior to dc home.     Follow Up Recommendations SNF    Equipment Recommendations  Rolling walker with 5" wheels;3in1 (PT)    Recommendations for Other Services       Precautions / Restrictions Precautions Precautions: Fall Restrictions RLE Weight Bearing: Touchdown weight bearing      Mobility  Bed Mobility Overal bed mobility: Needs Assistance Bed Mobility: Supine to Sit     Supine to sit: +2 for physical assistance;Mod assist;Max assist     General bed mobility comments: Cues for technique and sequence; Ultimately required 2 person assist to help move LEs off of teh bed, elevate trunk to sit, and stabilize once sitting due to heavy posterior lean  initailly  Transfers Overall transfer level: Needs assistance Equipment used: 2 person hand held assist;Rolling walker (2 wheeled) Transfers: Stand Pivot Transfers;Sit to/from Stand Sit to Stand: Max assist;+2 physical assistance Stand pivot transfers: Max assist;+2 safety/equipment       General transfer comment: Started with basic pivot OOB to recliner placed on pt's stronger R side; R knee blocked for stability during pivot to chair; then attemtped sit to stand from recliner to RW; Max assist to power up, and once up, noted she put too much weight on her RLE  Ambulation/Gait                Stairs            Wheelchair Mobility    Modified Rankin (Stroke Patients Only)       Balance Overall balance assessment: Needs assistance   Sitting balance-Leahy Scale: Poor(Approaching Fair) Sitting balance - Comments: initial heavy posterior lean     Standing balance-Leahy Scale: Zero                               Pertinent Vitals/Pain Pain Assessment: Faces Faces Pain Scale: Hurts even more Pain Location: R hip with attempts at ROM Pain Descriptors / Indicators: Grimacing;Guarding Pain Intervention(s): Monitored during session;Premedicated before session;Repositioned    Home Living Family/patient expects to be discharged to:: Private residence Living Arrangements: Spouse/significant other Available Help at Discharge: Family Type of Home: House Home Access: Stairs to enter   [employer] of Steps: (Pt did  not state)     Additional Comments: Unclear historian    Prior Function Level of Independence: Needs assistance         Comments: Unclear historian with history of dementia; Will need more information re: PLOF     Hand Dominance        Extremity/Trunk Assessment   Upper Extremity Assessment Upper Extremity Assessment: Generalized weakness    Lower Extremity Assessment Lower Extremity Assessment: RLE  deficits/detail RLE Deficits / Details: Grossly decr AROM and strength, limited by pain post fracture and surgical fixation RLE: Unable to fully assess due to pain    Cervical / Trunk Assessment Cervical / Trunk Assessment: Other exceptions Cervical / Trunk Exceptions: Overall trunk mobiltiy limtied by body habitus  Communication   Communication: HOH  Cognition Arousal/Alertness: Awake/alert Behavior During Therapy: WFL for tasks assessed/performed Overall Cognitive Status: No family/caregiver present to determine baseline cognitive functioning                                 General Comments: With history of dementia      General Comments General comments (skin integrity, edema, etc.): Session conducted on 3 L supplemental O2, and O2 sat remained greater than or equal to 92%    Exercises     Assessment/Plan    PT Assessment Patient needs continued PT services  PT Problem List Decreased strength;Decreased range of motion;Decreased activity tolerance;Decreased balance;Decreased mobility;Decreased knowledge of use of DME;Decreased safety awareness;Decreased knowledge of precautions;Pain;Decreased cognition;Obesity       PT Treatment Interventions DME instruction;Functional mobility training;Gait training;Balance training;Therapeutic activities;Therapeutic exercise;Neuromuscular re-education;Cognitive remediation;Patient/family education;Wheelchair mobility training    PT Goals (Current goals can be found in the Care Plan section)  Acute Rehab PT Goals Patient Stated Goal: Did not state, but agreeable to getting OOB PT Goal Formulation: Patient unable to participate in goal setting Time For Goal Achievement: 09/03/19 Potential to Achieve Goals: Fair    Frequency Min 2X/week   Barriers to discharge        Co-evaluation               AM-PAC PT "6 Clicks" Mobility  Outcome Measure Help needed turning from your back to your side while in a flat bed  without using bedrails?: A Lot Help needed moving from lying on your back to sitting on the side of a flat bed without using bedrails?: Total Help needed moving to and from a bed to a chair (including a wheelchair)?: A Lot Help needed standing up from a chair using your arms (e.g., wheelchair or bedside chair)?: Total Help needed to walk in hospital room?: Total Help needed climbing 3-5 steps with a railing? : Total 6 Click Score: 8    End of Session Equipment Utilized During Treatment: Gait belt Activity Tolerance: Patient tolerated treatment well Patient left: in chair;with call bell/phone within reach;with chair alarm set Nurse Communication: Mobility status PT Visit Diagnosis: Unsteadiness on feet (R26.81);Other abnormalities of gait and mobility (R26.89);Pain Pain - Right/Left: Right Pain - part of body: Hip    Time: 0914-0950 PT Time Calculation (min) (ACUTE ONLY): 36 min   Charges:   PT Evaluation $PT Eval Moderate Complexity: 1 Mod PT Treatments $Therapeutic Activity: 8-22 mins        Roney Marion, PT  Acute Rehabilitation Services Pager [phone] Office [phone]   Colletta [place] 08/20/2019, 2:10 PM

## 2019-08-20 NOTE — Plan of Care (Signed)
  Problem: Safety: Goal: Ability to remain free from injury will improve Outcome: Progressing   Problem: Elimination: Goal: Will not experience complications related to bowel motility Outcome: Progressing   Problem: Skin Integrity: Goal: Risk for impaired skin integrity will decrease Outcome: Progressing

## 2019-08-21 ENCOUNTER — Other Ambulatory Visit (HOSPITAL_BASED_OUTPATIENT_CLINIC_OR_DEPARTMENT_OTHER): Payer: Medicare HMO

## 2019-08-21 ENCOUNTER — Encounter (HOSPITAL_COMMUNITY): Payer: Self-pay | Admitting: Orthopedic Surgery

## 2019-08-21 DIAGNOSIS — L89322 Pressure ulcer of left buttock, stage 2: Secondary | ICD-10-CM

## 2019-08-21 DIAGNOSIS — I350 Nonrheumatic aortic (valve) stenosis: Secondary | ICD-10-CM | POA: Diagnosis not present

## 2019-08-21 DIAGNOSIS — N179 Acute kidney failure, unspecified: Secondary | ICD-10-CM | POA: Diagnosis not present

## 2019-08-21 DIAGNOSIS — S72001A Fracture of unspecified part of neck of right femur, initial encounter for closed fracture: Secondary | ICD-10-CM | POA: Diagnosis not present

## 2019-08-21 DIAGNOSIS — Z8781 Personal history of (healed) traumatic fracture: Secondary | ICD-10-CM | POA: Diagnosis not present

## 2019-08-21 LAB — BASIC METABOLIC PANEL
Anion gap: 8 (ref 5–15)
BUN: 52 mg/dL — ABNORMAL HIGH (ref 8–23)
CO2: 24 mmol/L (ref 22–32)
Calcium: 8.4 mg/dL — ABNORMAL LOW (ref 8.9–10.3)
Chloride: 105 mmol/L (ref 98–111)
Creatinine, Ser: 1.51 mg/dL — ABNORMAL HIGH (ref 0.44–1.00)
GFR calc Af Amer: 40 mL/min — ABNORMAL LOW (ref >60)
GFR calc non Af Amer: 34 mL/min — ABNORMAL LOW (ref >60)
Glucose, Bld: 148 mg/dL — ABNORMAL HIGH (ref 70–99)
Potassium: 4.4 mmol/L (ref 3.5–5.1)
Sodium: 137 mmol/L (ref 135–145)

## 2019-08-21 LAB — CBC
HCT: 24.5 % — ABNORMAL LOW (ref 36.0–46.0)
Hemoglobin: 7.8 g/dL — ABNORMAL LOW (ref 12.0–15.0)
MCH: 29.7 pg (ref 26.0–34.0)
MCHC: 31.8 g/dL (ref 30.0–36.0)
MCV: 93.2 fL (ref 80.0–100.0)
Platelets: 204 10*3/uL (ref 150–400)
RBC: 2.63 MIL/uL — ABNORMAL LOW (ref 3.87–5.11)
RDW: 15.6 % — ABNORMAL HIGH (ref 11.5–15.5)
WBC: 12.9 10*3/uL — ABNORMAL HIGH (ref 4.0–10.5)
nRBC: 0.2 % (ref 0.0–0.2)

## 2019-08-21 LAB — GLUCOSE, CAPILLARY
Glucose-Capillary: 139 mg/dL — ABNORMAL HIGH (ref 70–99)
Glucose-Capillary: 123 mg/dL — ABNORMAL HIGH (ref 70–99)
Glucose-Capillary: 184 mg/dL — ABNORMAL HIGH (ref 70–99)
Glucose-Capillary: 340 mg/dL — ABNORMAL HIGH (ref 70–99)
Glucose-Capillary: 383 mg/dL — ABNORMAL HIGH (ref 70–99)
Glucose-Capillary: 412 mg/dL — ABNORMAL HIGH (ref 70–99)

## 2019-08-21 LAB — SARS CORONAVIRUS 2 (TAT 6-24 HRS): SARS Coronavirus 2: NEGATIVE

## 2019-08-21 MED ORDER — FOSFOMYCIN TROMETHAMINE 3 G PO PACK
3.0000 g | PACK | Freq: Once | ORAL | Status: AC
  Administered 2019-08-21: 3 g via ORAL
  Filled 2019-08-21: qty 3

## 2019-08-21 NOTE — Plan of Care (Signed)

## 2019-08-21 NOTE — NC FL2 (Signed)
[place] LEVEL OF CARE SCREENING TOOL     IDENTIFICATION  Patient Name: Jenny Glover Birthdate: Oct 28, 1945 Sex: female Admission Date (Current Location): 08/15/2019  [facility] and [place] Number:  [employer] and Address:  [facility], [place] [address] [place], [place] [address]      Provider Number: [id number]  Attending Physician Name and Address:  Patrecia Pour, MD  Relative Name and Phone Number:  Rhilyn Battle [phone]    Current Level of Care: Hospital Recommended Level of Care: [place] Prior Approval Number:    Date Approved/Denied:   PASRR Number:    Discharge Plan: SNF    Current Diagnoses: Patient Active Problem List   Diagnosis Date Noted  . S/P right hip fracture   . Pressure injury of skin 08/17/2019  . Closed right hip fracture, initial encounter ([place]) 08/15/2019  . ARF (acute renal failure) ([place]) 08/15/2019  . Aortic stenosis 07/08/2018  . Senile dementia, uncomplicated ([place]) 03/54/6568  . OSA on CPAP 12/30/2015  . CAD (coronary artery disease) 08/23/2012  . Morbid obesity ([place])   . DM (diabetes mellitus) with peripheral vascular complication ([place]) 12/75/1700  . Hypertensive heart disease   . Chronic atrial fibrillation   . Hyperlipidemia     Orientation RESPIRATION BLADDER Height & Weight     Self, Place  Normal Incontinent, External catheter Weight: 86 kg Height:  5\' 10"  (177.8 cm)(5'10)  BEHAVIORAL SYMPTOMS/MOOD NEUROLOGICAL BOWEL NUTRITION STATUS  Other (Comment)(N/A) (N/A) Incontinent Diet(see discharge summary)  AMBULATORY STATUS COMMUNICATION OF NEEDS Skin   Extensive Assist Verbally Surgical wounds, PU Stage and Appropriate Care(Right leg)   PU Stage 2 Dressing: (Foam dressing PRN)                   Personal Care Assistance Level of Assistance  Bathing, Feeding, Dressing Bathing Assistance: Maximum assistance Feeding assistance: Limited assistance Dressing  Assistance: Maximum assistance     Functional Limitations Info  Sight, Hearing, Speech Sight Info: Impaired Hearing Info: Impaired Speech Info: Adequate    SPECIAL CARE FACTORS FREQUENCY  PT (By licensed PT), OT (By licensed OT)     PT Frequency: Min 2X/week OT Frequency: consult pending            Contractures Contractures Info: Not present    Additional Factors Info  Code Status, Allergies Code Status Info: Full Code Allergies Info: NKDA           Current Medications (08/21/2019):  This is the current hospital active medication list Current Facility-Administered Medications  Medication Dose Route Frequency Provider Last Rate Last Dose  . amLODipine (NORVASC) tablet 5 mg  5 mg Oral Daily Rod Can, MD   5 mg at 08/21/19 0937  . atorvastatin (LIPITOR) tablet 80 mg  80 mg Oral q1800 Rod Can, MD   80 mg at 08/20/19 1725  . cholecalciferol (VITAMIN D3) tablet 2,000 Units  2,000 Units Oral Daily Rod Can, MD   2,000 Units at 08/21/19 [phone]  . docusate sodium (COLACE) capsule 100 mg  100 mg Oral BID Rod Can, MD   100 mg at 08/21/19 4496  . escitalopram (LEXAPRO) tablet 10 mg  10 mg Oral Daily Rod Can, MD   10 mg at 08/21/19 0937  . feeding supplement (GLUCERNA SHAKE) (GLUCERNA SHAKE) liquid 237 mL  237 mL Oral TID BM Swinteck, Aaron Edelman, MD   237 mL at 08/21/19 0945  . hydrALAZINE (APRESOLINE) injection 10 mg  10  mg Intravenous Q4H PRN Swinteck, Aaron Edelman, MD      . insulin aspart (novoLOG) injection 0-15 Units  0-15 Units Subcutaneous Q4H Allie Bossier, MD   2 Units at 08/21/19 [phone]  . insulin glargine (LANTUS) injection 30 Units  30 Units Subcutaneous BID Allie Bossier, MD   30 Units at 08/21/19 [phone]  . memantine (NAMENDA) tablet 5 mg  5 mg Oral BID Allie Bossier, MD   5 mg at 08/21/19 7672  . menthol-cetylpyridinium (CEPACOL) lozenge 3 mg  1 lozenge Oral PRN Swinteck, Aaron Edelman, MD       Or  . phenol (CHLORASEPTIC) mouth spray 1 spray  1 spray  Mouth/Throat PRN Swinteck, Aaron Edelman, MD      . metoCLOPramide (REGLAN) tablet 5 mg  5 mg Oral Q8H PRN Swinteck, Aaron Edelman, MD       Or  . metoCLOPramide (REGLAN) injection 5 mg  5 mg Intravenous Q8H PRN Swinteck, Aaron Edelman, MD      . metoprolol succinate (TOPROL-XL) 24 hr tablet 100 mg  100 mg Oral Daily Rod Can, MD   100 mg at 08/21/19 0937  . morphine 2 MG/ML injection 0.5 mg  0.5 mg Intravenous Q2H PRN Rod Can, MD   0.5 mg at 08/16/19 0656  . multivitamin with minerals tablet 1 tablet  1 tablet Oral Daily Rod Can, MD   1 tablet at 08/21/19 [phone]  . ondansetron (ZOFRAN) tablet 4 mg  4 mg Oral Q6H PRN Swinteck, Aaron Edelman, MD       Or  . ondansetron (ZOFRAN) injection 4 mg  4 mg Intravenous Q6H PRN Swinteck, Aaron Edelman, MD      . rivaroxaban Alveda Reasons) tablet 10 mg  10 mg Oral Daily Rod Can, MD   10 mg at 08/21/19 0962     Discharge Medications: Please see discharge summary for a list of discharge medications.  Relevant Imaging Results:  Relevant Lab Results:   Additional Information SSN: [id number]  Midge Minium MSN, RN, NCM-BC, ACM-RN [phone]  Please be advised that the above-named patient has a primary diagnosis of dementia which supersedes any psychiatric diagnosis. Please be advised that the above-named patient will require a short-term nursing home stay-anticipated 30 days or less for rehabilitation and strengthening. The plan is to return home.

## 2019-08-21 NOTE — Plan of Care (Signed)
  Problem: Pain Managment: Goal: General experience of comfort will improve Outcome: Progressing   Problem: Safety: Goal: Ability to remain free from injury will improve Outcome: Progressing   Problem: Skin Integrity: Goal: Risk for impaired skin integrity will decrease Outcome: Progressing   Problem: Elimination: Goal: Will not experience complications related to bowel motility Outcome: Progressing   Problem: Clinical Measurements: Goal: Ability to maintain clinical measurements within normal limits will improve Outcome: Progressing

## 2019-08-21 NOTE — TOC Initial Note (Addendum)
Transition of Care [facility]) - Initial/Assessment Note    Patient Details  Name: Jenny Glover MRN: [id number] Date of Birth: June 11, 1946  [facility] of [facility] CM/SW Contact:    Midge Minium MSN, RN, NCM-BC, ACM-RN [phone] Phone Number: 08/21/2019, 1:36 PM  Clinical Narrative:                 CM following for dispositional needs. CM spoke to the patients spouse, Jenny Glover, d/t the patients cognitive impairment. Patient is a 73 y.o. female with history of A. fib, CAD, diabetes mellitus, hypertension, dementia; s/p right IMN. Patient lived at home with her spouse PTA. PT eval completed with ST SNF recommended. CM discussed recommendations with the patients spouse, with spouse agreeable. CMS SNF compare list provided to spouse to review. FL2 completed; PASSR being screened for history of dementia/anxiety (still pending). Spouse requesting SN[facility] referral be faxed [facility]. Insurance auth required; awaiting OT eval. CM team will continue to follow.   Expected Discharge Plan: Skilled Nursing Facility Barriers to Discharge: Continued Medical Work up, SNF Pending bed offer, Insurance Authorization   Patient Goals and CMS Choice Patient states their goals for this hospitalization and ongoing recovery are:: patient unable to state CMS Medicare.gov Compare Post Acute Care list provided to:: Patient Represenative (must comment)(Jenny Glover (provided to spouse)) Choice offered to / list presented to : Spouse(Jenny Glover (provided to spouse))  Expected Discharge Plan and Services Expected Discharge Plan: [place] In-house Referral: Clinical Social Work Discharge Planning Services: CM Consult Post Acute Care Choice: [place] arrangements for the past 2 months: Single Family Home                 DME Arranged: N/A DME Agency: NA       HH Arranged: NA [place] Agency: NA        Prior Living Arrangements/Services Living arrangements for  the past 2 months: [place] Lives with:: Self, Spouse Patient language and need for interpreter reviewed:: Yes Do you feel safe going back to the place where you live?: Yes      Need for Family Participation in Patient Care: Yes (Comment) Care giver support system in place?: No (comment)(spouse states he's unable to provide the current support needed post-transition; requesting ST SNF) Current home services: DME Criminal Activity/Legal Involvement Pertinent to Current Situation/Hospitalization: No - Comment as needed  Activities of Daily Living Home Assistive Devices/Equipment: Eyeglasses, Dentures (specify type), CBG Meter ADL Screening (condition at time of admission) Patient's cognitive ability adequate to safely complete daily activities?: No Is the patient deaf or have difficulty hearing?: No Does the patient have difficulty seeing, even when wearing glasses/contacts?: No Does the patient have difficulty concentrating, remembering, or making decisions?: Yes Patient able to express need for assistance with ADLs?: Yes Does the patient have difficulty dressing or bathing?: Yes Independently performs ADLs?: No Does the patient have difficulty walking or climbing stairs?: Yes Weakness of Legs: Both Weakness of Arms/Hands: None  Permission Sought/Granted Permission sought to share information with : Case Manager, Customer service manager, Family Supports Permission granted to share information with : Yes, Verbal Permission Granted  Share Information with NAME: Jenny Glover  Permission granted to share info w AGENCY: SNF facilities  Permission granted to share info w Relationship: spouse  Permission granted to share info w Contact Information: [phone]  Emotional Assessment Appearance:: Appears stated age     Orientation: : Oriented to Self, Oriented to Place Alcohol / Substance Use:  Not Applicable Psych Involvement: No (comment)  Admission diagnosis:  Fall  [W19.XXXA] S/P right hip fracture [Z87.81] Closed right hip fracture, initial encounter [facility]) [S72.001A] Patient Active Problem List   Diagnosis Date Noted  . S/P right hip fracture   . Pressure injury of skin 08/17/2019  . Closed right hip fracture, initial encounter ([place]) 08/15/2019  . ARF (acute renal failure) ([place]) 08/15/2019  . Aortic stenosis 07/08/2018  . Senile dementia, uncomplicated ([place]) 35/24/8185  . OSA on CPAP 12/30/2015  . CAD (coronary artery disease) 08/23/2012  . Morbid obesity ([place])   . DM (diabetes mellitus) with peripheral vascular complication ([place]) 90/93/1121  . Hypertensive heart disease   . Chronic atrial fibrillation   . Hyperlipidemia    PCP:  Reynold Bowen, MD Pharmacy:   [facility] [address], [place] - [place] [facility] [address] [place] [address] Phone: [phone] Fax: [phone]     Social Determinants of Health (SDOH) Interventions    Readmission Risk Interventions No flowsheet data found.

## 2019-08-21 NOTE — Progress Notes (Signed)
PROGRESS NOTE  Jenny Glover  NOM:[id number] DOB: 1945/12/11 DOA: 08/15/2019 PCP: Reynold Bowen, MD  Brief Narrative: Jenny Glover is a 73 y.o. female with a history of T2DM, CAD, AFib, HTN, and dementia who presented to the ED 12/1 from home after a fall found to have right hip fracture on XR. Labs indicated ARF and worsened anemia with  hypotension. This delayed surgery until 12/5 when she underwent IM nail placement. Xarelto was held, and 2u PRBCs given on 12/3 , then 2u again on 12/4. Nephrology was consulted, though creatinine improved with supportive care. PT has recommended SNF placement at discharge.  Assessment & Plan: Principal Problem:   Closed right hip fracture, initial encounter ([place]) Active Problems:   DM (diabetes mellitus) with peripheral vascular complication (HCC)   Hypertensive heart disease   Chronic atrial fibrillation   CAD (coronary artery disease)   Senile dementia, uncomplicated (HCC)   OSA on CPAP   Aortic stenosis   ARF (acute renal failure) (HCC)   Pressure injury of skin   S/P right hip fracture  Right hip fracture s/p mechanical fall: s/p IM nail 12/5 by Dr. Lyla Glassing.  - TDWB per ortho.  - Pain control per orthopedics. - Continue xarelto 10mg  daily, plan to go back to 20mg  dose pending stability of creatinine and hgb. - Continue vitamin D  Acute blood loss anemia on anemia of chronic renal disease: s/p 4u PRBCs this admission.  - Trending downward slowly post op, will monitor CBC in AM again. Transfusion threshold is 7g/dl.   AKI on stage IIIb CKD: Baseline Cr ~1.5-1.6. Improving with hemodynamic support.  - Continue monitoring. Nephrology has signed off, recommending PCP follow up for CKD.   Chronic atrial fibrillation: Follows up with Dr. Agustin Cree. Rate has been well controlled. Ok to DC telemetry.  - Continue metoprolol, xarelto  CAD: No chest pain.  - Continue anticoagulation, beta blocker, statin. Holding plavix for now with  transfusion-dependent anemia.  Essential HTN: Control BP while avoiding hypotension. - Continue hydralazine, norvasc.   Moderate aortic stenosis: Per echocardiogram 2019 - Outpatient follow up. Doubt this contributed to fall.   Hyperkalemia: Improved with lokelma.  - Continue monitoring.   Dementia, depression, anxiety: Well controlled  - Namenda 5 mg BID - Continue SSRI. - Delirium precautions.  T2DM: Chronically well-controlled with HbA1c 6.4%. Acutely this is uncontrolled with hyperglycemia.  - Continue lantus 30 units BID and moderate SSI  Stage II pressure injury to left buttock, POA:  - Offload as able.  - Optimize nutritional status.  DVT prophylaxis: Xarelto Code Status: Full Family Communication: Husband at bedside Disposition Plan: CSW consulted for SNF. OT evaluation and PASRR pending. Hopeful for DC 12/8.  Consultants:   Orthopedics  Procedures:   08/19/19 INTRAMEDULLARY (IM) NAIL Audelia Acton, MD   Antimicrobials:  Ancef x1 12/5  Subjective: Confused, denies having surgery or transfusions. Husband at bedside reports this is baseline. She denies any pain. She's eating well. No bleeding.  Objective: Vitals:   08/21/19 0359 08/21/19 0754 08/21/19 0937 08/21/19 0939  BP: (!) 123/51 (!) 130/56 (!) 122/55 (!) 122/55  Pulse: 74 63 66 66  Resp:      Temp: 98.4 F (36.9 C) 98.4 F (36.9 C)    TempSrc: Oral Oral    SpO2: 100% 99%  97%  Weight:      Height:        Intake/Output Summary (Last 24 hours) at 08/21/2019 1330 Last data filed at 08/21/2019 1113 Gross  per 24 hour  Intake 480 ml  Output 900 ml  Net -420 ml   Filed Weights   08/20/19 2024  Weight: 86 kg    Gen: 73 y.o. female in no distress Pulm: Non-labored breathing room air. Clear to auscultation bilaterally.  CV: Regular rate and rhythm. No murmur, rub, or gallop. No JVD, no pedal edema. GI: Abdomen soft, non-tender, non-distended, with normoactive bowel  sounds. No organomegaly or masses felt. Ext: Warm, no deformities. Moves BLE's and denies sensation abnormality, FHL, EHL intact BLE's.  Skin: No rashes, lesions or ulcers on visualized skin. Area around incision sites on lateral right thigh are tender and mildly erythematous without fluctuance or induration. Neuro: Alert and disoriented. No focal neurological deficits. Psych: Judgement and insight appear normal. Mood & affect appropriate.   Data Reviewed: I have personally reviewed following labs and imaging studies  CBC: Recent Labs  Lab 08/15/19 2120 08/16/19 1045 08/17/19 1100  08/18/19 0332 08/18/19 1350 08/19/19 0347 08/20/19 0414 08/21/19 0416  WBC 17.8* 13.3* 16.9*  --  14.3*  --  12.6* 9.8 12.9*  NEUTROABS 15.2* 10.0* 12.5*  --   --   --   --   --   --   HGB 10.9* 8.1* 6.5*   < > 7.2* 7.3* 9.4* 8.5* 7.8*  HCT 36.6 27.0* 21.0*   < > 21.7* 22.0* 28.9* 26.2* 24.5*  MCV 95.1 94.1 92.9  --  89.3  --  90.0 91.3 93.2  PLT 342 288 275  --  211  --  212 202 204   < > = values in this interval not displayed.   Basic Metabolic Panel: Recent Labs  Lab 08/17/19 1100 08/18/19 0332 08/19/19 0347 08/20/19 0414 08/21/19 0416  NA 136 135 140 137 137  K 4.8 4.3 4.3 5.6* 4.4  CL 105 105 110 107 105  CO2 20* 22 23 24 24   GLUCOSE 352* 281* 173* 362* 148*  BUN 66* 57* 45* 45* 52*  CREATININE 3.68* 2.54* 1.61* 1.56* 1.51*  CALCIUM 8.7* 8.4* 8.6* 8.3* 8.4*  PHOS  --  3.2 2.1*  --   --    GFR: Estimated Creatinine Clearance: 40.1 mL/min (A) (by C-G formula based on SCr of 1.51 mg/dL (H)). Liver Function Tests: Recent Labs  Lab 08/15/19 2120 08/18/19 0332 08/19/19 0347  AST 22  --   --   ALT 12  --   --   ALKPHOS 105  --   --   BILITOT 0.6  --   --   PROT 7.0  --   --   ALBUMIN 3.1* 2.5* 2.4*   No results for input(s): LIPASE, AMYLASE in the last 168 hours. No results for input(s): AMMONIA in the last 168 hours. Coagulation Profile: Recent Labs  Lab 08/17/19 1155  08/19/19 0347  INR 1.5* 1.2   Cardiac Enzymes: No results for input(s): CKTOTAL, CKMB, CKMBINDEX, TROPONINI in the last 168 hours. BNP (last 3 results) No results for input(s): PROBNP in the last 8760 hours. HbA1C: Recent Labs    08/20/19 0414  HGBA1C 6.4*   CBG: Recent Labs  Lab 08/20/19 2020 08/20/19 2351 08/21/19 0359 08/21/19 0757 08/21/19 1143  GLUCAP 252* 163* 139* 123* 184*   Lipid Profile: Recent Labs    08/20/19 0414  CHOL 107  HDL 27*  LDLCALC 59  TRIG 106  CHOLHDL 4.0   Thyroid Function Tests: No results for input(s): TSH, T4TOTAL, FREET4, T3FREE, THYROIDAB in the last 72 hours. Anemia Panel:  No results for input(s): VITAMINB12, FOLATE, FERRITIN, TIBC, IRON, RETICCTPCT in the last 72 hours. Urine analysis:    Component Value Date/Time   COLORURINE AMBER (A) 08/17/2019 1022   APPEARANCEUR CLOUDY (A) 08/17/2019 1022   LABSPEC 1.021 08/17/2019 1022   PHURINE 5.0 08/17/2019 1022   GLUCOSEU 150 (A) 08/17/2019 1022   HGBUR MODERATE (A) 08/17/2019 1022   BILIRUBINUR NEGATIVE 08/17/2019 1022   KETONESUR NEGATIVE 08/17/2019 1022   PROTEINUR 30 (A) 08/17/2019 1022   UROBILINOGEN 0.2 02/15/2015 1341   NITRITE NEGATIVE 08/17/2019 1022   LEUKOCYTESUR MODERATE (A) 08/17/2019 1022   Recent Results (from the past 240 hour(s))  SARS CORONAVIRUS 2 (TAT 6-24 HRS) Nasopharyngeal Nasopharyngeal Swab     Status: None   Collection Time: 08/15/19  8:42 PM   Specimen: Nasopharyngeal Swab  Result Value Ref Range Status   SARS Coronavirus 2 NEGATIVE NEGATIVE Final    Comment: (NOTE) SARS-CoV-2 target nucleic acids are NOT DETECTED. The SARS-CoV-2 RNA is generally detectable in upper and lower respiratory specimens during the acute phase of infection. Negative results do not preclude SARS-CoV-2 infection, do not rule out co-infections with other pathogens, and should not be used as the sole basis for treatment or other patient management decisions. Negative results  must be combined with clinical observations, patient history, and epidemiological information. The expected result is Negative. Fact Sheet for Patients: SugarRoll.be Fact Sheet for Healthcare Providers: https://www.woods-mathews.com/ This test is not yet approved or cleared by the [place] FDA and  has been authorized for detection and/or diagnosis of SARS-CoV-2 by FDA under an Emergency Use Authorization (EUA). This EUA will remain  in effect (meaning this test can be used) for the duration of the COVID-19 declaration under Section 56 4(b)(1) of the Act, 21 U.S.C. section 360bbb-3(b)(1), unless the authorization is terminated or revoked sooner. Performed at [facility], [place] [address], [place], [place] [address]   MRSA PCR Screening     Status: None   Collection Time: 08/16/19  8:06 PM   Specimen: Nasal Mucosa; Nasopharyngeal  Result Value Ref Range Status   MRSA by PCR NEGATIVE NEGATIVE Final    Comment:        The GeneXpert MRSA Assay (FDA approved for NASAL specimens only), is one component of a comprehensive MRSA colonization surveillance program. It is not intended to diagnose MRSA infection nor to guide or monitor treatment for MRSA infections. Performed at [facility], [place] [address], [place], [place] [address]   Urine culture     Status: Abnormal   Collection Time: 08/18/19 12:36 AM   Specimen: Urine, Clean Catch  Result Value Ref Range Status   Specimen Description URINE, CLEAN CATCH  Final   Special Requests   Final    NONE Performed at [facility], [place] [address] [place], [place] [address]    Culture >=100,000 COLONIES/mL ESCHERICHIA COLI (A)  Final   Report Status 08/20/2019 FINAL  Final   Organism ID, Bacteria ESCHERICHIA COLI (A)  Final      Susceptibility   Escherichia coli - MIC*    AMPICILLIN <=2 SENSITIVE Sensitive     CEFAZOLIN <=4 SENSITIVE Sensitive     CEFTRIAXONE <=1 SENSITIVE  Sensitive     CIPROFLOXACIN <=0.25 SENSITIVE Sensitive     GENTAMICIN <=1 SENSITIVE Sensitive     IMIPENEM 0.5 SENSITIVE Sensitive     NITROFURANTOIN <=16 SENSITIVE Sensitive     TRIMETH/SULFA <=20 SENSITIVE Sensitive     AMPICILLIN/SULBACTAM <=  2 SENSITIVE Sensitive     Extended ESBL NEGATIVE Sensitive     * >=100,000 COLONIES/mL ESCHERICHIA COLI      Radiology Studies: Pelvis Portable  Result Date: 08/19/2019 CLINICAL DATA:  Post nailing of RIGHT femur EXAM: PORTABLE PELVIS 1-2 VIEWS COMPARISON:  Portable exam 1437 hours compared to 08/15/2019 FINDINGS: IM nail with proximal compression screw placed across a reduced intertrochanteric fracture of the RIGHT femur. Lesser trochanteric fracture fragment remains displaced. Mild RIGHT hip joint space narrowing. LEFT hip joint and SI joints preserved. No pelvic fracture or bone destruction. IMPRESSION: Post ORIF of intertrochanteric fracture RIGHT femur. Electronically Signed   By: Lavonia Dana M.D.   On: 08/19/2019 14:58   Dg C-arm 1-60 Min  Result Date: 08/19/2019 CLINICAL DATA:  Intramedullary rod fixation of right femur fracture. EXAM: RIGHT FEMUR 2 VIEWS; DG C-ARM 1-60 MIN COMPARISON:  Right femur radiographs-08/15/2019 FLUOROSCOPY TIME:  2 minutes, 27 seconds FINDINGS: [place] intraoperative fluoroscopic images of the right femur are provided for review. Images demonstrate the sequela of intramedullary rod fixation of the right femur and dynamic screw fixation of the right femoral neck transfixing known comminuted intertrochanteric femur fracture. The distal end of the intramedullary rod is transfixed with a single cancellous screw. Alignment appears near anatomic. There is a minimal amount of subcutaneous emphysema about the operative site. No radiopaque foreign body. Incidentally noted moderate to severe degenerative change involving the medial and lateral compartments of the right knee. IMPRESSION: Post intramedullary rod and dynamic screw  fixation of the right femur and femoral neck without evidence of complication. Electronically Signed   By: Sandi Mariscal M.D.   On: 08/19/2019 14:27   Dg Femur, Min 2 Views Right  Result Date: 08/19/2019 CLINICAL DATA:  Intramedullary rod fixation of right femur fracture. EXAM: RIGHT FEMUR 2 VIEWS; DG C-ARM 1-60 MIN COMPARISON:  Right femur radiographs-08/15/2019 FLUOROSCOPY TIME:  2 minutes, 27 seconds FINDINGS: Five spot intraoperative fluoroscopic images of the right femur are provided for review. Images demonstrate the sequela of intramedullary rod fixation of the right femur and dynamic screw fixation of the right femoral neck transfixing known comminuted intertrochanteric femur fracture. The distal end of the intramedullary rod is transfixed with a single cancellous screw. Alignment appears near anatomic. There is a minimal amount of subcutaneous emphysema about the operative site. No radiopaque foreign body. Incidentally noted moderate to severe degenerative change involving the medial and lateral compartments of the right knee. IMPRESSION: Post intramedullary rod and dynamic screw fixation of the right femur and femoral neck without evidence of complication. Electronically Signed   By: Sandi Mariscal M.D.   On: 08/19/2019 14:27   Dg Femur Port, Min 2 Views Right  Result Date: 08/19/2019 CLINICAL DATA:  Status post right IM nail. EXAM: RIGHT FEMUR PORTABLE 2 VIEW COMPARISON:  Earlier today FINDINGS: Interval open reduction and internal fixation involving the comminuted intertrochanteric fracture involving the proximal right femur. There is been interval placement of a long IM nail with hip screw. The hardware components and fracture fragments are in anatomic alignment. Advanced osteoarthritis is noted involving the right knee. IMPRESSION: 1. Status post ORIF of comminuted intertrochanteric fracture of the proximal right femur. 2. Advanced osteoarthritis of the right knee. Electronically Signed   By: Kerby Moors M.D.   On: 08/19/2019 16:22    Scheduled Meds:  amLODipine  5 mg Oral Daily   atorvastatin  80 mg Oral q1800   cholecalciferol  2,000 Units Oral Daily   docusate  sodium  100 mg Oral BID   escitalopram  10 mg Oral Daily   feeding supplement (GLUCERNA SHAKE)  237 mL Oral TID BM   insulin aspart  0-15 Units Subcutaneous Q4H   insulin glargine  30 Units Subcutaneous BID   memantine  5 mg Oral BID   metoprolol succinate  100 mg Oral Daily   multivitamin with minerals  1 tablet Oral Daily   rivaroxaban  10 mg Oral Daily   Continuous Infusions:   LOS: 6 days   Time spent: 25 minutes.  Patrecia Pour, MD [facility] [email or web address] 08/21/2019, 1:30 PM

## 2019-08-21 NOTE — Progress Notes (Signed)
PT accidentally pulled out IV, no current PIV access, MD made aware.

## 2019-08-22 ENCOUNTER — Ambulatory Visit: Payer: Medicare HMO | Admitting: Podiatry

## 2019-08-22 DIAGNOSIS — S72001A Fracture of unspecified part of neck of right femur, initial encounter for closed fracture: Secondary | ICD-10-CM | POA: Diagnosis not present

## 2019-08-22 DIAGNOSIS — I350 Nonrheumatic aortic (valve) stenosis: Secondary | ICD-10-CM | POA: Diagnosis not present

## 2019-08-22 DIAGNOSIS — N179 Acute kidney failure, unspecified: Secondary | ICD-10-CM | POA: Diagnosis not present

## 2019-08-22 DIAGNOSIS — Z8781 Personal history of (healed) traumatic fracture: Secondary | ICD-10-CM | POA: Diagnosis not present

## 2019-08-22 LAB — RENAL FUNCTION PANEL
Albumin: 2.2 g/dL — ABNORMAL LOW (ref 3.5–5.0)
Anion gap: 9 (ref 5–15)
BUN: 50 mg/dL — ABNORMAL HIGH (ref 8–23)
CO2: 25 mmol/L (ref 22–32)
Calcium: 8.5 mg/dL — ABNORMAL LOW (ref 8.9–10.3)
Chloride: 103 mmol/L (ref 98–111)
Creatinine, Ser: 1.39 mg/dL — ABNORMAL HIGH (ref 0.44–1.00)
GFR calc Af Amer: 44 mL/min — ABNORMAL LOW (ref >60)
GFR calc non Af Amer: 38 mL/min — ABNORMAL LOW (ref >60)
Glucose, Bld: 159 mg/dL — ABNORMAL HIGH (ref 70–99)
Phosphorus: 2 mg/dL — ABNORMAL LOW (ref 2.5–4.6)
Potassium: 4.5 mmol/L (ref 3.5–5.1)
Sodium: 137 mmol/L (ref 135–145)

## 2019-08-22 LAB — CBC
HCT: 26.2 % — ABNORMAL LOW (ref 36.0–46.0)
Hemoglobin: 8.2 g/dL — ABNORMAL LOW (ref 12.0–15.0)
MCH: 29.6 pg (ref 26.0–34.0)
MCHC: 31.3 g/dL (ref 30.0–36.0)
MCV: 94.6 fL (ref 80.0–100.0)
Platelets: 251 10*3/uL (ref 150–400)
RBC: 2.77 MIL/uL — ABNORMAL LOW (ref 3.87–5.11)
RDW: 15.5 % (ref 11.5–15.5)
WBC: 14.4 10*3/uL — ABNORMAL HIGH (ref 4.0–10.5)
nRBC: 0.2 % (ref 0.0–0.2)

## 2019-08-22 LAB — GLUCOSE, CAPILLARY
Glucose-Capillary: 160 mg/dL — ABNORMAL HIGH (ref 70–99)
Glucose-Capillary: 175 mg/dL — ABNORMAL HIGH (ref 70–99)
Glucose-Capillary: 281 mg/dL — ABNORMAL HIGH (ref 70–99)
Glucose-Capillary: 309 mg/dL — ABNORMAL HIGH (ref 70–99)
Glucose-Capillary: 69 mg/dL — ABNORMAL LOW (ref 70–99)
Glucose-Capillary: 80 mg/dL (ref 70–99)
Glucose-Capillary: 92 mg/dL (ref 70–99)

## 2019-08-22 LAB — MAGNESIUM: Magnesium: 2 mg/dL (ref 1.7–2.4)

## 2019-08-22 MED ORDER — INSULIN ASPART 100 UNIT/ML ~~LOC~~ SOLN
4.0000 [IU] | Freq: Three times a day (TID) | SUBCUTANEOUS | Status: DC
  Administered 2019-08-22 – 2019-08-23 (×5): 4 [IU] via SUBCUTANEOUS

## 2019-08-22 MED ORDER — INSULIN GLARGINE 100 UNIT/ML ~~LOC~~ SOLN
25.0000 [IU] | Freq: Two times a day (BID) | SUBCUTANEOUS | Status: DC
  Administered 2019-08-22 – 2019-08-23 (×3): 25 [IU] via SUBCUTANEOUS
  Filled 2019-08-22 (×4): qty 0.25

## 2019-08-22 MED ORDER — INSULIN ASPART 100 UNIT/ML ~~LOC~~ SOLN: 3.0000 [IU] | Freq: Three times a day (TID) | SUBCUTANEOUS | Status: DC

## 2019-08-22 NOTE — Evaluation (Signed)
Occupational Therapy Evaluation Patient Details Name: Jenny Glover MRN: [id number] DOB: May 12, 1946 Today's Date: 08/22/2019    History of Present Illness Jenny Glover is a 73 y.o. female with history of A. fib, CAD, diabetes mellitus, hypertension, dementia was brought to the ER the patient had a fall at home and R hip pain; Now s/p Intramedullary fixation, Right femur, TDWB RLE; Hospital course also complicated by anemia and acute on chronic kidney injury.   Clinical Impression   Patient is s/p R IMN surgery resulting in functional limitations due to the deficits listed below (see OT problem list). Pt currently requires max (A) with pad for bed mobility and able to static sit Min guard. Pt however will require total +2 to transfer safely and recommend RN staff hoyer lift at this time. Pt could benefit from rehab upon dc at SNF. Patient will benefit from skilled OT acutely to increase independence and safety with ADLS to allow discharge SNF.     Follow Up Recommendations  SNF    Equipment Recommendations  Wheelchair cushion (measurements OT);Wheelchair (measurements OT);Hospital bed    Recommendations for Other Services       Precautions / Restrictions Precautions Precautions: Fall Restrictions Weight Bearing Restrictions: Yes RLE Weight Bearing: Touchdown weight bearing      Mobility Bed Mobility Overal bed mobility: Needs Assistance Bed Mobility: Supine to Sit;Sit to Supine     Supine to sit: Max assist Sit to supine: Max assist   General bed mobility comments: pt able to static sit EOB min guard (A). pt requires total (A) with R LE throughout transfers. pt with bed tilted with HOB down to allow therapist to max (A) patient back to bed. Session limited to bed level as patient will need two peopel to safely attempt a transfer  Transfers                 General transfer comment: defer to two person (A) required    Balance Overall balance assessment: Needs  assistance   Sitting balance-Leahy Scale: Fair Sitting balance - Comments: able to sit and drink coffee at EOB today                                   ADL either performed or assessed with clinical judgement   ADL Overall ADL's : Needs assistance/impaired Eating/Feeding: Set up;Bed level Eating/Feeding Details (indicate cue type and reason): able to sequence task for self feeding Grooming: Wash/dry face;Supervision/safety;Bed level       Lower Body Bathing: Total assistance                         General ADL Comments: pt eating breakfast on arrival. pt able to progress to eob and static sit and return to supine.      Vision Baseline Vision/History: Wears glasses Wears Glasses: At all times       Perception     Praxis      Pertinent Vitals/Pain Pain Assessment: Faces Faces Pain Scale: Hurts even more Pain Location: R hip with attempts at ROM Pain Descriptors / Indicators: Grimacing;Guarding Pain Intervention(s): Premedicated before session;Repositioned;Ice applied     Hand Dominance Right   Extremity/Trunk Assessment Upper Extremity Assessment Upper Extremity Assessment: Generalized weakness   Lower Extremity Assessment Lower Extremity Assessment: Defer to PT evaluation   Cervical / Trunk Assessment Cervical / Trunk Assessment: Other exceptions Cervical / Trunk  Exceptions: rounded shoulders and limited by body habitus   Communication Communication Communication: HOH   Cognition Arousal/Alertness: Awake/alert Behavior During Therapy: WFL for tasks assessed/performed Overall Cognitive Status: History of cognitive impairments - at baseline                                 General Comments: With history of dementia   General Comments  RA this session     Exercises     Shoulder Instructions      Home Living Family/patient expects to be discharged to:: Skilled nursing facility                                         Prior Functioning/Environment Level of Independence: Needs assistance        Comments: Unclear historian with history of dementia; Will need more information re: PLOF        OT Problem List: Decreased strength;Decreased activity tolerance;Impaired balance (sitting and/or standing);Decreased cognition;Decreased coordination;Decreased safety awareness;Decreased knowledge of precautions;Decreased knowledge of use of DME or AE;Obesity;Pain      OT Treatment/Interventions: Self-care/ADL training;Therapeutic exercise;Neuromuscular education;Energy conservation;DME and/or AE instruction;Manual therapy;Modalities;Cognitive remediation/compensation;Therapeutic activities    OT Goals(Current goals can be found in the care plan section) Acute Rehab OT Goals Patient Stated Goal: "you will have to ask the doctor" OT Goal Formulation: Patient unable to participate in goal setting Time For Goal Achievement: 09/05/19 Potential to Achieve Goals: Good  OT Frequency: Min 2X/week   Barriers to D/C: Decreased caregiver support          Co-evaluation              AM-PAC OT "6 Clicks" Daily Activity     Outcome Measure Help from another person eating meals?: A Little Help from another person taking care of personal grooming?: A Lot Help from another person toileting, which includes using toliet, bedpan, or urinal?: Total Help from another person bathing (including washing, rinsing, drying)?: Total Help from another person to put on and taking off regular upper body clothing?: A Lot Help from another person to put on and taking off regular lower body clothing?: Total 6 Click Score: 10   End of Session Nurse Communication: Mobility status;Precautions;Need for lift equipment  Activity Tolerance: Patient tolerated treatment well Patient left: in bed;with call bell/phone within reach;with bed alarm set;with SCD's reapplied  OT Visit Diagnosis: Unsteadiness on feet  (R26.81);Muscle weakness (generalized) (M62.81)                Time: 7782-4235 OT Time Calculation (min): 25 min Charges:  OT General Charges $OT Visit: 1 Visit OT Evaluation $OT Eval Moderate Complexity: 1 Mod   Brynn, OTR/L  Acute Rehabilitation Services Pager: [phone] Office: [phone] .   Jeri Modena 08/22/2019, 9:25 AM

## 2019-08-22 NOTE — Progress Notes (Signed)
Physical Therapy Treatment Patient Details Name: Jenny Glover MRN: [id number] DOB: [birth year] Today's Date: 08/22/2019    History of Present Illness Jenny Glover is a 73 y.o. female with history of A. fib, CAD, diabetes mellitus, hypertension, dementia was brought to the ER the patient had a fall at home and R hip pain; Now s/p Intramedullary fixation, Right femur, TDWB RLE; Hospital course also complicated by anemia and acute on chronic kidney injury.    PT Comments    Continuing work on functional mobility and activity tolerance;  Requires 2 person assist to get to EOB, and tehn basic pivot OOB to recliner on her stronger, L side; Better standing tolerance, and good use of RW, however she did put too much weight on RLE; noted plans for SNF for rehab; PT in agreement  Follow Up Recommendations  SNF     Equipment Recommendations  Rolling walker with 5" wheels;3in1 (PT)    Recommendations for Other Services       Precautions / Restrictions Precautions Precautions: Fall Restrictions RLE Weight Bearing: Touchdown weight bearing    Mobility  Bed Mobility Overal bed mobility: Needs Assistance Bed Mobility: Supine to Sit     Supine to sit: Max assist     General bed mobility comments: Max assist for all aspects of bed mobility; she did particiapte and follow cues related to hand placement in getting up  Transfers Overall transfer level: Needs assistance Equipment used: Rolling walker (2 wheeled) Transfers: Risk manager;Sit to/from Stand Sit to Stand: Max assist;+2 physical assistance Stand pivot transfers: Max assist;+2 safety/equipment       General transfer comment: Stood to RW from elevated bed with assist; good support of self on RW, however still putting too much weight on RLE  Ambulation/Gait                 Stairs             Wheelchair Mobility    Modified Rankin (Stroke Patients Only)       Balance     Sitting  balance-Leahy Scale: Fair       Standing balance-Leahy Scale: Poor                              Cognition Arousal/Alertness: Awake/alert Behavior During Therapy: WFL for tasks assessed/performed Overall Cognitive Status: History of cognitive impairments - at baseline                                 General Comments: With history of dementia      Exercises General Exercises - Lower Extremity Long Arc Quad: Right;5 reps;Seated(short range into extension)    General Comments        Pertinent Vitals/Pain Pain Assessment: Faces Faces Pain Scale: Hurts even more Pain Location: R hip with attempts at ROM Pain Descriptors / Indicators: Grimacing;Guarding Pain Intervention(s): Monitored during session    Home Living                      Prior Function            PT Goals (current goals can now be found in the care plan section) Acute Rehab PT Goals Patient Stated Goal: agreeable t getting oob PT Goal Formulation: Patient unable to participate in goal setting Time For Goal Achievement: 09/03/19 Potential to Achieve Goals: Fair  Progress towards PT goals: Progressing toward goals(slowly)    Frequency    Min 2X/week      PT Plan Current plan remains appropriate    Co-evaluation              AM-PAC PT "6 Clicks" Mobility   Outcome Measure  Help needed turning from your back to your side while in a flat bed without using bedrails?: A Lot Help needed moving from lying on your back to sitting on the side of a flat bed without using bedrails?: A Lot Help needed moving to and from a bed to a chair (including a wheelchair)?: A Lot Help needed standing up from a chair using your arms (e.g., wheelchair or bedside chair)?: A Lot Help needed to walk in hospital room?: Total Help needed climbing 3-5 steps with a railing? : Total 6 Click Score: 10    End of Session Equipment Utilized During Treatment: Gait belt Activity Tolerance:  Patient tolerated treatment well Patient left: in chair;with call bell/phone within reach;with chair alarm set Nurse Communication: Mobility status PT Visit Diagnosis: Unsteadiness on feet (R26.81);Other abnormalities of gait and mobility (R26.89);Pain Pain - Right/Left: Right Pain - part of body: Hip     Time: 2334-3568 PT Time Calculation (min) (ACUTE ONLY): 17 min  Charges:  $Therapeutic Activity: 8-22 mins                     Roney Marion, PT  Acute Rehabilitation Services Pager [phone] Office [place] 08/22/2019, 3:49 PM

## 2019-08-22 NOTE — Plan of Care (Signed)

## 2019-08-22 NOTE — Progress Notes (Signed)
Hypoglycemic Event  CBG: 07:43 = 69  Treatment: 1 cup orange juice and glucerna shake  Symptoms: Asymptomatic  Follow-up CBG: Time:08:45 CBG Result:92  Comments/MD notified: Dr. Bonner Puna made aware    Jenny Glover

## 2019-08-22 NOTE — Progress Notes (Signed)
   08/22/19 1324  TOC Discharge Assessment  Patient chooses bed at [facility] has accepted the bed offer at [facility] with a bed available on 08/23/19 (pending insurance auth). CM will continue to follow.  Midge Minium MSN, RN, NCM-BC, ACM-RN [phone]

## 2019-08-22 NOTE — Progress Notes (Signed)
PROGRESS NOTE  Jenny HIRSCHFELD  EYC:[id number] DOB: [birth year] DOA: 08/15/2019 PCP: Reynold Bowen, MD  Brief Narrative: Jenny Glover is a 73 y.o. female with a history of T2DM, CAD, AFib, HTN, and dementia who presented to the ED 12/1 from home after a fall found to have right hip fracture on XR. Labs indicated ARF and worsened anemia with  hypotension. This delayed surgery until 12/5 when she underwent IM nail placement. Xarelto was held, and 2u PRBCs given on 12/3 , then 2u again on 12/4. Nephrology was consulted, though creatinine improved with supportive care. PT has recommended SNF placement at discharge.  Assessment & Plan: Principal Problem:   Closed right hip fracture, initial encounter ([place]) Active Problems:   DM (diabetes mellitus) with peripheral vascular complication (HCC)   Hypertensive heart disease   Chronic atrial fibrillation   CAD (coronary artery disease)   Senile dementia, uncomplicated (HCC)   OSA on CPAP   Aortic stenosis   ARF (acute renal failure) (HCC)   Pressure injury of skin   S/P right hip fracture  Right hip fracture s/p mechanical fall: s/p IM nail 12/5 by Dr. Lyla Glassing.  - TDWB per ortho.  - Pain control per orthopedics. - Continue xarelto 10mg  daily, plan to go back to 20mg  dose pending stability of creatinine and hgb. Recheck both 12/9. - Continue vitamin D  Acute blood loss anemia on anemia of chronic renal disease: s/p 4u PRBCs this admission.  - Trending downward slowly post op, will monitor CBC in AM again. Transfusion threshold is 7g/dl.   AKI on stage IIIb CKD: Baseline Cr ~1.5-1.6. Improving with hemodynamic support.  - Continue monitoring. Nephrology has signed off, recommending PCP follow up for CKD.   Chronic atrial fibrillation: Follows up with Dr. Agustin Cree. Rate has been well controlled. Ok to DC telemetry.  - Continue metoprolol, xarelto  CAD: No chest pain.  - Continue anticoagulation, beta blocker, statin. Holding plavix for  now with transfusion-dependent anemia.  Essential HTN: Control BP while avoiding hypotension. - Continue hydralazine, norvasc.   Moderate aortic stenosis: Per echocardiogram 2019 - Outpatient follow up. Doubt this contributed to fall.   Hyperkalemia: Improved with lokelma.  - Continue monitoring.   E. coli UTI:  - Treated with fosfomycin x1.   Dementia, depression, anxiety: Well controlled  - Namenda 5 mg BID - Continue SSRI. - Delirium precautions.  T2DM: Chronically well-controlled with HbA1c 6.4%. Acutely this is uncontrolled with hyperglycemia.  - Continue lantus 30 units BID and moderate SSI  Stage II pressure injury to left buttock, POA:  - Offload as able.  - Optimize nutritional status.  DVT prophylaxis: Xarelto Code Status: Full Family Communication: Husband at bedside Disposition Plan: CSW consulted for SNF. Hopeful for insurance authorization and DC to SNF 12/9.   Consultants:   Orthopedics  Nephrology  Procedures:   08/19/19 INTRAMEDULLARY (IM) NAIL Genella Rife, Aaron Edelman, MD   Antimicrobials:  Ancef x1 12/5  Fosfomycin x1 12/7   Subjective: Mental status is stable, at baseline per husband. Denies pain, eating well, no bleeding.   Objective: Vitals:   08/21/19 1357 08/22/19 0442 08/22/19 0748 08/22/19 1106  BP: (!) 118/56 (!) 115/57 (!) 114/51 (!) 135/51  Pulse: 74 62 66 76  Resp: 15 18 16 16   Temp: (!) 97.5 F (36.4 C) 99.1 F (37.3 C) 98.5 F (36.9 C)   TempSrc: Oral Oral Oral   SpO2: 98% 97% 98% 100%  Weight:      Height:  Intake/Output Summary (Last 24 hours) at 08/22/2019 1329 Last data filed at 08/22/2019 2505 Gross per 24 hour  Intake 600 ml  Output 1300 ml  Net -700 ml   Filed Weights   08/20/19 2024  Weight: 86 kg   Gen: 73 y.o. female in no distress Pulm: Nonlabored breathing room air. Clear. CV: Irreg irreg. Stable II/VI systolic murmur, no other murmur, rub, or gallop. No JVD, no significant  dependent edema. GI: Abdomen soft, non-tender, non-distended, with normoactive bowel sounds.  Ext: Warm, no deformities Skin: No new rashes, lesions or ulcers on visualized skin. Surgical sites nontender without surrounding erythema. One anterior site superiorly has dried blood and is hemostatic.  Neuro: Alert and disoriented. No focal neurological deficits. Psych: Judgement and insight appear fair. Mood euthymic & affect congruent. Behavior is appropriate.    Data Reviewed: I have personally reviewed following labs and imaging studies  CBC: Recent Labs  Lab 08/15/19 2120 08/16/19 1045 08/17/19 1100  08/18/19 0332 08/18/19 1350 08/19/19 0347 08/20/19 0414 08/21/19 0416 08/22/19 0107  WBC 17.8* 13.3* 16.9*  --  14.3*  --  12.6* 9.8 12.9* 14.4*  NEUTROABS 15.2* 10.0* 12.5*  --   --   --   --   --   --   --   HGB 10.9* 8.1* 6.5*   < > 7.2* 7.3* 9.4* 8.5* 7.8* 8.2*  HCT 36.6 27.0* 21.0*   < > 21.7* 22.0* 28.9* 26.2* 24.5* 26.2*  MCV 95.1 94.1 92.9  --  89.3  --  90.0 91.3 93.2 94.6  PLT 342 288 275  --  211  --  212 202 204 251   < > = values in this interval not displayed.   Basic Metabolic Panel: Recent Labs  Lab 08/18/19 0332 08/19/19 0347 08/20/19 0414 08/21/19 0416 08/22/19 0107 08/22/19 0108  NA 135 140 137 137  --  137  K 4.3 4.3 5.6* 4.4  --  4.5  CL 105 110 107 105  --  103  CO2 22 23 24 24   --  25  GLUCOSE 281* 173* 362* 148*  --  159*  BUN 57* 45* 45* 52*  --  50*  CREATININE 2.54* 1.61* 1.56* 1.51*  --  1.39*  CALCIUM 8.4* 8.6* 8.3* 8.4*  --  8.5*  MG  --   --   --   --  2.0  --   PHOS 3.2 2.1*  --   --   --  2.0*   GFR: Estimated Creatinine Clearance: 43.6 mL/min (A) (by C-G formula based on SCr of 1.39 mg/dL (H)). Liver Function Tests: Recent Labs  Lab 08/15/19 2120 08/18/19 0332 08/19/19 0347 08/22/19 0108  AST 22  --   --   --   ALT 12  --   --   --   ALKPHOS 105  --   --   --   BILITOT 0.6  --   --   --   PROT 7.0  --   --   --   ALBUMIN  3.1* 2.5* 2.4* 2.2*   No results for input(s): LIPASE, AMYLASE in the last 168 hours. No results for input(s): AMMONIA in the last 168 hours. Coagulation Profile: Recent Labs  Lab 08/17/19 1155 08/19/19 0347  INR 1.5* 1.2   Cardiac Enzymes: No results for input(s): CKTOTAL, CKMB, CKMBINDEX, TROPONINI in the last 168 hours. BNP (last 3 results) No results for input(s): PROBNP in the last 8760 hours. HbA1C: Recent Labs  08/20/19 0414  HGBA1C 6.4*   CBG: Recent Labs  Lab 08/22/19 0014 08/22/19 0408 08/22/19 0743 08/22/19 0845 08/22/19 1121  GLUCAP 175* 80 69* 92 160*   Lipid Profile: Recent Labs    08/20/19 0414  CHOL 107  HDL 27*  LDLCALC 59  TRIG 106  CHOLHDL 4.0   Thyroid Function Tests: No results for input(s): TSH, T4TOTAL, FREET4, T3FREE, THYROIDAB in the last 72 hours. Anemia Panel: No results for input(s): VITAMINB12, FOLATE, FERRITIN, TIBC, IRON, RETICCTPCT in the last 72 hours. Urine analysis:    Component Value Date/Time   COLORURINE AMBER (A) 08/17/2019 1022   APPEARANCEUR CLOUDY (A) 08/17/2019 1022   LABSPEC 1.021 08/17/2019 1022   PHURINE 5.0 08/17/2019 1022   GLUCOSEU 150 (A) 08/17/2019 1022   HGBUR MODERATE (A) 08/17/2019 1022   BILIRUBINUR NEGATIVE 08/17/2019 1022   KETONESUR NEGATIVE 08/17/2019 1022   PROTEINUR 30 (A) 08/17/2019 1022   UROBILINOGEN 0.2 02/15/2015 1341   NITRITE NEGATIVE 08/17/2019 1022   LEUKOCYTESUR MODERATE (A) 08/17/2019 1022   Recent Results (from the past 240 hour(s))  SARS CORONAVIRUS 2 (TAT 6-24 HRS) Nasopharyngeal Nasopharyngeal Swab     Status: None   Collection Time: 08/15/19  8:42 PM   Specimen: Nasopharyngeal Swab  Result Value Ref Range Status   SARS Coronavirus 2 NEGATIVE NEGATIVE Final    Comment: (NOTE) SARS-CoV-2 target nucleic acids are NOT DETECTED. The SARS-CoV-2 RNA is generally detectable in upper and lower respiratory specimens during the acute phase of infection. Negative results do not  preclude SARS-CoV-2 infection, do not rule out co-infections with other pathogens, and should not be used as the sole basis for treatment or other patient management decisions. Negative results must be combined with clinical observations, patient history, and epidemiological information. The expected result is Negative. Fact Sheet for Patients: SugarRoll.be Fact Sheet for Healthcare Providers: https://www.woods-mathews.com/ This test is not yet approved or cleared by the [place] FDA and  has been authorized for detection and/or diagnosis of SARS-CoV-2 by FDA under an Emergency Use Authorization (EUA). This EUA will remain  in effect (meaning this test can be used) for the duration of the COVID-19 declaration under Section 56 4(b)(1) of the Act, 21 U.S.C. section 360bbb-3(b)(1), unless the authorization is terminated or revoked sooner. Performed at [facility], [place] [address], [place], [place] [address]   MRSA PCR Screening     Status: None   Collection Time: 08/16/19  8:06 PM   Specimen: Nasal Mucosa; Nasopharyngeal  Result Value Ref Range Status   MRSA by PCR NEGATIVE NEGATIVE Final    Comment:        The GeneXpert MRSA Assay (FDA approved for NASAL specimens only), is one component of a comprehensive MRSA colonization surveillance program. It is not intended to diagnose MRSA infection nor to guide or monitor treatment for MRSA infections. Performed at [facility], [place] [address] [place], [place] [address]   Urine culture     Status: Abnormal   Collection Time: 08/18/19 12:36 AM   Specimen: Urine, Clean Catch  Result Value Ref Range Status   Specimen Description URINE, CLEAN CATCH  Final   Special Requests   Final    NONE Performed at [facility], [place] [address], [place], [place] [address]    Culture >=100,000 COLONIES/mL ESCHERICHIA COLI (A)  Final   Report Status 08/20/2019 FINAL  Final   Organism  ID, Bacteria ESCHERICHIA COLI (A)  Final  Susceptibility   Escherichia coli - MIC*    AMPICILLIN <=2 SENSITIVE Sensitive     CEFAZOLIN <=4 SENSITIVE Sensitive     CEFTRIAXONE <=1 SENSITIVE Sensitive     CIPROFLOXACIN <=0.25 SENSITIVE Sensitive     GENTAMICIN <=1 SENSITIVE Sensitive     IMIPENEM 0.5 SENSITIVE Sensitive     NITROFURANTOIN <=16 SENSITIVE Sensitive     TRIMETH/SULFA <=20 SENSITIVE Sensitive     AMPICILLIN/SULBACTAM <=2 SENSITIVE Sensitive     Extended ESBL NEGATIVE Sensitive     * >=100,000 COLONIES/mL ESCHERICHIA COLI  SARS CORONAVIRUS 2 (TAT 6-24 HRS) Nasopharyngeal Nasopharyngeal Swab     Status: None   Collection Time: 08/21/19  2:17 PM   Specimen: Nasopharyngeal Swab  Result Value Ref Range Status   SARS Coronavirus 2 NEGATIVE NEGATIVE Final    Comment: (NOTE) SARS-CoV-2 target nucleic acids are NOT DETECTED. The SARS-CoV-2 RNA is generally detectable in upper and lower respiratory specimens during the acute phase of infection. Negative results do not preclude SARS-CoV-2 infection, do not rule out co-infections with other pathogens, and should not be used as the sole basis for treatment or other patient management decisions. Negative results must be combined with clinical observations, patient history, and epidemiological information. The expected result is Negative. Fact Sheet for Patients: SugarRoll.be Fact Sheet for Healthcare Providers: https://www.woods-mathews.com/ This test is not yet approved or cleared by the [place] FDA and  has been authorized for detection and/or diagnosis of SARS-CoV-2 by FDA under an Emergency Use Authorization (EUA). This EUA will remain  in effect (meaning this test can be used) for the duration of the COVID-19 declaration under Section 56 4(b)(1) of the Act, 21 U.S.C. section 360bbb-3(b)(1), unless the authorization is terminated or revoked sooner. Performed at [facility], [place] [address], [place],  [address]       Radiology Studies: No results found.  Scheduled Meds: . amLODipine  5 mg Oral Daily  . atorvastatin  80 mg Oral q1800  . cholecalciferol  2,000 Units Oral Daily  . docusate sodium  100 mg Oral BID  . escitalopram  10 mg Oral Daily  . feeding supplement (GLUCERNA SHAKE)  237 mL Oral TID BM  . insulin aspart  0-15 Units Subcutaneous Q4H  . insulin aspart  4 Units Subcutaneous TID WC  . insulin glargine  25 Units Subcutaneous BID  . memantine  5 mg Oral BID  . metoprolol succinate  100 mg Oral Daily  . multivitamin with minerals  1 tablet Oral Daily  . rivaroxaban  10 mg Oral Daily   Continuous Infusions:   LOS: 7 days   Time spent: 25 minutes.  Patrecia Pour, MD [facility] [email or web address] 08/22/2019, 1:29 PM

## 2019-08-22 NOTE — Plan of Care (Signed)

## 2019-08-23 DIAGNOSIS — R600 Localized edema: Secondary | ICD-10-CM | POA: Diagnosis not present

## 2019-08-23 DIAGNOSIS — Z4802 Encounter for removal of sutures: Secondary | ICD-10-CM | POA: Diagnosis not present

## 2019-08-23 DIAGNOSIS — I4821 Permanent atrial fibrillation: Secondary | ICD-10-CM | POA: Diagnosis not present

## 2019-08-23 DIAGNOSIS — I2510 Atherosclerotic heart disease of native coronary artery without angina pectoris: Secondary | ICD-10-CM | POA: Diagnosis not present

## 2019-08-23 DIAGNOSIS — D631 Anemia in chronic kidney disease: Secondary | ICD-10-CM | POA: Diagnosis not present

## 2019-08-23 DIAGNOSIS — R5381 Other malaise: Secondary | ICD-10-CM | POA: Diagnosis not present

## 2019-08-23 DIAGNOSIS — E119 Type 2 diabetes mellitus without complications: Secondary | ICD-10-CM | POA: Diagnosis not present

## 2019-08-23 DIAGNOSIS — I119 Hypertensive heart disease without heart failure: Secondary | ICD-10-CM | POA: Diagnosis not present

## 2019-08-23 DIAGNOSIS — E1151 Type 2 diabetes mellitus with diabetic peripheral angiopathy without gangrene: Secondary | ICD-10-CM | POA: Diagnosis not present

## 2019-08-23 DIAGNOSIS — S72001A Fracture of unspecified part of neck of right femur, initial encounter for closed fracture: Secondary | ICD-10-CM | POA: Diagnosis not present

## 2019-08-23 DIAGNOSIS — Z20828 Contact with and (suspected) exposure to other viral communicable diseases: Secondary | ICD-10-CM | POA: Diagnosis not present

## 2019-08-23 DIAGNOSIS — D500 Iron deficiency anemia secondary to blood loss (chronic): Secondary | ICD-10-CM | POA: Diagnosis not present

## 2019-08-23 DIAGNOSIS — S72141D Displaced intertrochanteric fracture of right femur, subsequent encounter for closed fracture with routine healing: Secondary | ICD-10-CM | POA: Diagnosis not present

## 2019-08-23 DIAGNOSIS — I350 Nonrheumatic aortic (valve) stenosis: Secondary | ICD-10-CM | POA: Diagnosis not present

## 2019-08-23 DIAGNOSIS — Z7401 Bed confinement status: Secondary | ICD-10-CM | POA: Diagnosis not present

## 2019-08-23 DIAGNOSIS — N179 Acute kidney failure, unspecified: Secondary | ICD-10-CM | POA: Diagnosis not present

## 2019-08-23 DIAGNOSIS — E46 Unspecified protein-calorie malnutrition: Secondary | ICD-10-CM | POA: Diagnosis not present

## 2019-08-23 DIAGNOSIS — L89322 Pressure ulcer of left buttock, stage 2: Secondary | ICD-10-CM | POA: Diagnosis not present

## 2019-08-23 DIAGNOSIS — F0390 Unspecified dementia without behavioral disturbance: Secondary | ICD-10-CM | POA: Diagnosis not present

## 2019-08-23 DIAGNOSIS — M25551 Pain in right hip: Secondary | ICD-10-CM | POA: Diagnosis not present

## 2019-08-23 DIAGNOSIS — M6281 Muscle weakness (generalized): Secondary | ICD-10-CM | POA: Diagnosis not present

## 2019-08-23 DIAGNOSIS — M2550 Pain in unspecified joint: Secondary | ICD-10-CM | POA: Diagnosis not present

## 2019-08-23 DIAGNOSIS — E1165 Type 2 diabetes mellitus with hyperglycemia: Secondary | ICD-10-CM | POA: Diagnosis not present

## 2019-08-23 DIAGNOSIS — Z8781 Personal history of (healed) traumatic fracture: Secondary | ICD-10-CM | POA: Diagnosis not present

## 2019-08-23 DIAGNOSIS — R262 Difficulty in walking, not elsewhere classified: Secondary | ICD-10-CM | POA: Diagnosis not present

## 2019-08-23 DIAGNOSIS — I482 Chronic atrial fibrillation, unspecified: Secondary | ICD-10-CM | POA: Diagnosis not present

## 2019-08-23 DIAGNOSIS — I10 Essential (primary) hypertension: Secondary | ICD-10-CM | POA: Diagnosis not present

## 2019-08-23 DIAGNOSIS — G4733 Obstructive sleep apnea (adult) (pediatric): Secondary | ICD-10-CM | POA: Diagnosis not present

## 2019-08-23 LAB — GLUCOSE, CAPILLARY
Glucose-Capillary: 107 mg/dL — ABNORMAL HIGH (ref 70–99)
Glucose-Capillary: 128 mg/dL — ABNORMAL HIGH (ref 70–99)
Glucose-Capillary: 135 mg/dL — ABNORMAL HIGH (ref 70–99)
Glucose-Capillary: 171 mg/dL — ABNORMAL HIGH (ref 70–99)
Glucose-Capillary: 257 mg/dL — ABNORMAL HIGH (ref 70–99)

## 2019-08-23 LAB — BASIC METABOLIC PANEL
Anion gap: 8 (ref 5–15)
BUN: 45 mg/dL — ABNORMAL HIGH (ref 8–23)
CO2: 24 mmol/L (ref 22–32)
Calcium: 8.3 mg/dL — ABNORMAL LOW (ref 8.9–10.3)
Chloride: 104 mmol/L (ref 98–111)
Creatinine, Ser: 1.27 mg/dL — ABNORMAL HIGH (ref 0.44–1.00)
GFR calc Af Amer: 49 mL/min — ABNORMAL LOW (ref >60)
GFR calc non Af Amer: 42 mL/min — ABNORMAL LOW (ref >60)
Glucose, Bld: 131 mg/dL — ABNORMAL HIGH (ref 70–99)
Potassium: 4.8 mmol/L (ref 3.5–5.1)
Sodium: 136 mmol/L (ref 135–145)

## 2019-08-23 LAB — CBC
HCT: 25.3 % — ABNORMAL LOW (ref 36.0–46.0)
Hemoglobin: 8 g/dL — ABNORMAL LOW (ref 12.0–15.0)
MCH: 29.5 pg (ref 26.0–34.0)
MCHC: 31.6 g/dL (ref 30.0–36.0)
MCV: 93.4 fL (ref 80.0–100.0)
Platelets: 260 10*3/uL (ref 150–400)
RBC: 2.71 MIL/uL — ABNORMAL LOW (ref 3.87–5.11)
RDW: 16 % — ABNORMAL HIGH (ref 11.5–15.5)
WBC: 14.3 10*3/uL — ABNORMAL HIGH (ref 4.0–10.5)
nRBC: 0.2 % (ref 0.0–0.2)

## 2019-08-23 MED ORDER — GLUCERNA SHAKE PO LIQD: 237.0000 mL | Freq: Three times a day (TID) | ORAL | 0 refills | Status: AC

## 2019-08-23 MED ORDER — ACETAMINOPHEN 500 MG PO TABS: 500.0000 mg | ORAL_TABLET | Freq: Four times a day (QID) | ORAL | 0 refills | Status: AC | PRN

## 2019-08-23 MED ORDER — ADULT MULTIVITAMIN W/MINERALS CH: 1.0000 | ORAL_TABLET | Freq: Every day | ORAL | 0 refills | Status: AC

## 2019-08-23 NOTE — Progress Notes (Signed)
Nutrition Follow-up  RD working remotely.  DOCUMENTATION CODES:   Not applicable  INTERVENTION:   -Continue Glucerna Shake po TID, each supplement provides 220 kcal and 10 grams of protein -Continue MVI with minerals daily  NUTRITION DIAGNOSIS:   Increased nutrient needs related to wound healing as evidenced by estimated needs.  Ongoing  GOAL:   Patient will meet greater than or equal to 90% of their needs  Progressing  MONITOR:   PO intake, Supplement acceptance, Labs, Weight trends, Skin, I & O's  REASON FOR ASSESSMENT:   Consult Hip fracture protocol  ASSESSMENT:   Jenny Glover is a 73 y.o. female with history of A. fib, CAD, diabetes mellitus, hypertension, dementia was brought to the ER the patient had a fall at home but denies hitting her head also incontinence but denies any chest pain or shortness of breath after the fall had right hip pain and was brought to the ER.  12/5- s/p PROCEDURE: Intramedullary fixation, Right femur.  Reviewed I/O's: -580 ml x 24 hours and -2 L since admission  UOP: 1.3 L x 24 hours  Per RN notes, pt confused and pulling at lines.   Appetite continues to be good; noted meal completion 50-100%. Pt has been drinking Glucerna supplements per MAR.  Pt awaiting insurance authorization for discharge to SNF [name].   Labs reviewed: CBGS: 107-135 (inpatient orders for glycemic control are 0-15 units insulin aspart every 4 hours, 4 units insulin aspart TID with meals, and 25 units insulin glargine BID).   Diet Order:   Diet Order            Diet Carb Modified Fluid consistency: Thin; Room service appropriate? Yes  Diet effective now              EDUCATION NEEDS:   No education needs have been identified at this time  Skin:  Skin Assessment: Skin Integrity Issues: Skin Integrity Issues:: Incisions, Stage II Stage II: sacrum Incisions: closed rt leg  Last BM:  08/22/19  Height:   Ht Readings from Last 1  Encounters:  08/20/19 5\' 10"  (1.778 m)    Weight:   Wt Readings from Last 1 Encounters:  08/20/19 86 kg    Ideal Body Weight:  72.7 kg  BMI:  Body mass index is 27.2 kg/m.  Estimated Nutritional Needs:   Kcal:  2000-2200  Protein:  100-115 grams  Fluid:  2-2.2 L    Jenny Glover A. Jimmye Norman, RD, LDN, [place] Registered Dietitian II Certified Diabetes Care and Education Specialist Pager: [phone] After hours Pager: [phone]

## 2019-08-23 NOTE — Progress Notes (Signed)
   08/23/19 1517  SNF Authorization Status  SNF Authorization Type SNF Facility Authorization Request  SNF Auth Started 08/23/19  SNF Auth Start Time 1210  SNF Authorization Complete 08/23/19  SNF Auth Complete Time 1517  SNF Authorization Status Complete

## 2019-08-23 NOTE — Discharge Summary (Addendum)
Physician Discharge Summary  Jenny Glover RNH:[id number] DOB: 05-19-1946 DOA: 08/15/2019  PCP: Reynold Bowen, MD  Admit date: 08/15/2019 Discharge date: 08/23/2019  Admitted From: Home  Disposition:  SNF   Recommendations for Outpatient Follow-up and new medication changes:  1. Follow up with Dr. Forde Dandy in 2 weeks.  2. Continue pain control with acetaminophen as needed.  3. Will stop clopidogrel and continue with rivaroxaban.   Home Health: no   Equipment/Devices: no    Discharge Condition: stable  CODE STATUS: full  Diet recommendation: heart healthy and diabetic prudent.   Brief/Interim Summary: 73 year old female who presented after mechanical fall.  She does have significant past medical history for atrial fibrillation, coronary artery disease, type 2 diabetes mellitus, hypertension and dementia.  Patient sustained a mechanical fall at home, developing significant pain at the right hip.  On her initial physical examination blood pressure 113/48, heart rate 79, respiratory rate 22, her lungs were clear to auscultation bilaterally, heart S1-S2 present and rhythmic, soft abdomen, no lower extremity edema. Sodium 138, potassium 5.3, chloride 105, bicarb 21, glucose 324, BUN 36, creatinine 2.0, white count 17.8 hemoglobin 10.9, hematocrit 36.6, platelets 342.  SARS COVID-19 negative.  Urinalysis had 21-50 white cells, red cells 11-20, many bacteria, specific gravity 1.021.  Her chest x-ray was negative for infiltrates.  Right femur had a comminuted right femoral intratrochanteric fracture, with varus angulation.  EKG 69 bpm, left axis deviation, normal QRS and QTc, atrial fibrillation, no ST segment or T wave changes.  Patient was admitted to the hospital with working diagnosis of right hip fracture.  Her surgery was initially delayed due to anemia and renal failure.  Patient has received 4 units of packed red blood cells.  Her kidney function improved.  She underwent surgery December  5.  Patient has been evaluated by physical therapy and recommendations for SNF to continue recovery.   1.  Acute comminuted right femoral intratrochanteric fracture with varus angulation.  Patient received pain control and DVT prophylaxis.  Her anticoagulation was held, her condition was optimized with anemia correction and hemodynamic monitoring.  Patient underwent surgical intervention intramedullary fixation of the right femur on December 5 with no major complications.  Her pain has been well controlled, she has worked with physical therapy, plan to discharge to a skilled nursing facility to continue her recovery.  2.  Acute blood loss anemia combined with anemia chronic kidney disease.  Her nadir hemoglobin reach 6.5, she received 4 units packed red blood cells, her discharge hemoglobin is 8.0, hematocrit 25.3.  Patient has been resumed on anticoagulation with no major bleeding.  3.  Acute kidney injury, acute tubular necrosis on chronic kidney disease stage IIIb with hyperkalemia.  Patient received supportive medical therapy with improvement of her kidney function.  Patient received sodium zirconium with improvement of potassium.  Discharge kidney function with a sodium 137, potassium 4.5, chloride 103, bicarb 25, glucose 159, BUN 50, creatinine 1.39.   4.  Chronic atrial fibrillation/ CAD.  It remained well controlled with metoprolol, continue rivaroxaban for anticoagulation. Patient has been chest pain free. Due to high risk of bleeding clopidogrel has been discontinued for now, I have reviewed the old records, note from Dr. Agustin Cree, noted multiple risk factors for CAD, but her disease has been stable, now considering significant anemia, on this admission will hold on antiplatelet therapy for now, reassess as outpatient. In my opinion at this point in time patient has more risk of worsening anemia than an acute coronary  event.   5.  Hypertension and aortic stenosis.  Continue blood pressure  control metoprolol.  No signs of heart failure.  6.  Urinary tract infection, present on admission.  Patient was treated with 1 dose of fosfomycin, with good toleration.  7.  Type 2 diabetes mellitus.  Controlled, hemoglobin 6.4. Dyslipidemia Patient will resume her insulin regimen at discharge.Continue with statin therapy.   8.  Stage II pressure injury left buttock, present on admission.  Continue local wound care, optimize nutrition.  9. Dementia with unspecified calorie protein malnutrition. Episodic confusion but no agitation, continue with escitalopram, memantine. Continue with nutritional supplementation.   Discharge Diagnoses:  Principal Problem:   Closed right hip fracture, initial encounter ([place]) Active Problems:   DM (diabetes mellitus) with peripheral vascular complication (HCC)   Hypertensive heart disease   Chronic atrial fibrillation   CAD (coronary artery disease)   Senile dementia, uncomplicated (HCC)   OSA on CPAP   Aortic stenosis   ARF (acute renal failure) (HCC)   Pressure injury of skin   S/P right hip fracture    Discharge Instructions   Allergies as of 08/23/2019   No Known Allergies     Medication List    STOP taking these medications   clopidogrel 75 MG tablet Commonly known as: PLAVIX     TAKE these medications   acetaminophen 500 MG tablet Commonly known as: TYLENOL Take 1 tablet (500 mg total) by mouth every 6 (six) hours as needed (pain).   amLODipine 5 MG tablet Commonly known as: NORVASC Take 5 mg by mouth daily.   atorvastatin 80 MG tablet Commonly known as: LIPITOR Take 1 tablet (80 mg total) by mouth daily at 6 PM.   blood glucose meter kit and supplies Kit Dispense based on patient and insurance preference. Use up to four times daily as directed. (FOR ICD-9 250.00, 250.01).   cholecalciferol 1000 units tablet Commonly known as: VITAMIN D Take 2,000 Units by mouth daily.   escitalopram 10 MG tablet Commonly known as:  LEXAPRO Take 10 mg daily by mouth.   feeding supplement (GLUCERNA SHAKE) Liqd Take 237 mLs by mouth 3 (three) times daily between meals.   insulin glargine 100 UNIT/ML injection Commonly known as: LANTUS Inject 0.15 mLs (15 Units total) into the skin at bedtime. What changed:   how much to take  when to take this  additional instructions   insulin lispro 100 UNIT/ML injection Commonly known as: HUMALOG Inject 10 Units into the skin 3 (three) times daily before meals.   memantine 5 MG tablet Commonly known as: NAMENDA Take 5 mg 2 (two) times daily by mouth.   metoprolol succinate 100 MG 24 hr tablet Commonly known as: TOPROL-XL Take 100 mg by mouth daily. Take with or immediately following a meal.   multivitamin with minerals Tabs tablet Take 1 tablet by mouth daily. Start taking on: August 24, 2019   rivaroxaban 20 MG Tabs tablet Commonly known as: XARELTO Take 1 tablet (20 mg total) by mouth daily.      Follow-up Information    Schedule an appointment as soon as possible for a visit with Rod Can, MD.   Specialty: Orthopedic Surgery Why: For wound re-check, For suture removal Contact information: [address] [place] [address] [phone]        [facility] Preferred SNF Follow up.   Specialty: [place] Why: rehab Contact information: [address] [place] [phone]  Reynold Bowen, MD Follow up in 2 week(s).   Specialty: Endocrinology Contact information: [address] [place] [address] [phone]          No Known Allergies  Consultations:  Orthopedics  Nephrology    Procedures/Studies: Dg Chest 1 View  Result Date: 08/15/2019 CLINICAL DATA:  Fall, right hip fracture EXAM: CHEST  1 VIEW COMPARISON:  02/15/2015 FINDINGS: Heart and mediastinal contours are within normal limits. No focal opacities or effusions. No acute bony  abnormality. IMPRESSION: No active disease. Electronically Signed   By: Rolm Baptise M.D.   On: 08/15/2019 20:33   Dg Pelvis 1-2 Views  Result Date: 08/15/2019 CLINICAL DATA:  Fall, right hip pain EXAM: PELVIS - 1-2 VIEW COMPARISON:  None. FINDINGS: There is a right femoral intertrochanteric fracture with varus angulation. Lesser trochanter fragment is displaced. No subluxation or dislocation. SI joints symmetric. Degenerative changes in the visualized lower lumbar spine. IMPRESSION: Right femoral intertrochanteric fracture with varus angulation. Electronically Signed   By: Rolm Baptise M.D.   On: 08/15/2019 20:29   US Renal  Result Date: 08/17/2019 CLINICAL DATA:  Acute renal failure EXAM: RENAL / URINARY TRACT ULTRASOUND COMPLETE COMPARISON:  Ultrasound 05/12/2013 FINDINGS: Right Kidney: Renal measurements: 10.3 x 4.4 x 4.7 cm = volume: 112 mL . Echogenicity within normal limits. No mass or hydronephrosis visualized. Left Kidney: Renal measurements: 11.0 x 6.4 x 5.2 cm = volume: 192 mL. Echogenicity within normal limits. Redemonstration of a simple appearing renal sinus cyst measuring 3.6 x 3.2 x 3.7 cm. No solid mass or hydronephrosis visualized. Bladder: Appears normal for degree of bladder distention. Other: None. IMPRESSION: 1. Negative for obstructive uropathy. 2. 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 Electronically Signed   By: Davina Poke M.D.   On: 08/17/2019 12:00   Pelvis Portable  Result Date: 08/19/2019 CLINICAL DATA:  Post nailing of RIGHT femur EXAM: PORTABLE PELVIS 1-2 VIEWS COMPARISON:  Portable exam 1437 hours compared to 08/15/2019 FINDINGS: IM nail with proximal compression screw placed across a reduced intertrochanteric fracture of the RIGHT femur. Lesser trochanteric fracture fragment remains displaced. Mild RIGHT hip joint space narrowing. LEFT hip joint and SI joints preserved. No pelvic fracture or bone destruction. IMPRESSION: Post ORIF of  intertrochanteric fracture RIGHT femur. Electronically Signed   By: Lavonia Dana M.D.   On: 08/19/2019 14:58   Dg C-arm 1-60 Min  Result Date: 08/19/2019 CLINICAL DATA:  Intramedullary rod fixation of right femur fracture. EXAM: RIGHT FEMUR 2 VIEWS; DG C-ARM 1-60 MIN COMPARISON:  Right femur radiographs-08/15/2019 FLUOROSCOPY TIME:  2 minutes, 27 seconds FINDINGS: Five spot intraoperative fluoroscopic images of the right femur are provided for review. Images demonstrate the sequela of intramedullary rod fixation of the right femur and dynamic screw fixation of the right femoral neck transfixing known comminuted intertrochanteric femur fracture. The distal end of the intramedullary rod is transfixed with a single cancellous screw. Alignment appears near anatomic. There is a minimal amount of subcutaneous emphysema about the operative site. No radiopaque foreign body. Incidentally noted moderate to severe degenerative change involving the medial and lateral compartments of the right knee. IMPRESSION: Post intramedullary rod and dynamic screw fixation of the right femur and femoral neck without evidence of complication. Electronically Signed   By: Sandi Mariscal M.D.   On: 08/19/2019 14:27   Dg Femur, Min 2 Views Right  Result Date: 08/19/2019 CLINICAL DATA:  Intramedullary rod fixation of right femur fracture. EXAM: RIGHT FEMUR 2 VIEWS;  DG C-ARM 1-60 MIN COMPARISON:  Right femur radiographs-08/15/2019 FLUOROSCOPY TIME:  2 minutes, 27 seconds FINDINGS: Five spot intraoperative fluoroscopic images of the right femur are provided for review. Images demonstrate the sequela of intramedullary rod fixation of the right femur and dynamic screw fixation of the right femoral neck transfixing known comminuted intertrochanteric femur fracture. The distal end of the intramedullary rod is transfixed with a single cancellous screw. Alignment appears near anatomic. There is a minimal amount of subcutaneous emphysema about the  operative site. No radiopaque foreign body. Incidentally noted moderate to severe degenerative change involving the medial and lateral compartments of the right knee. IMPRESSION: Post intramedullary rod and dynamic screw fixation of the right femur and femoral neck without evidence of complication. Electronically Signed   By: Sandi Mariscal M.D.   On: 08/19/2019 14:27   Dg Femur Min 2 Views Right  Result Date: 08/15/2019 CLINICAL DATA:  Fall, right hip pain EXAM: RIGHT FEMUR 2 VIEWS COMPARISON:  Pelvis images today FINDINGS: There is a comminuted right femoral intertrochanteric fracture with moderately displaced fracture fragments and varus angulation. No subluxation or dislocation. No additional acute bony abnormality. Advanced degenerative changes in the right knee. IMPRESSION: Comminuted right femoral intertrochanteric fracture with varus angulation. Electronically Signed   By: Rolm Baptise M.D.   On: 08/15/2019 20:29   Dg Femur [place], [place] 2 Views Right  Result Date: 08/19/2019 CLINICAL DATA:  Status post right IM nail. EXAM: RIGHT FEMUR PORTABLE 2 VIEW COMPARISON:  Earlier today FINDINGS: Interval open reduction and internal fixation involving the comminuted intertrochanteric fracture involving the proximal right femur. There is been interval placement of a long IM nail with hip screw. The hardware components and fracture fragments are in anatomic alignment. Advanced osteoarthritis is noted involving the right knee. IMPRESSION: 1. Status post ORIF of comminuted intertrochanteric fracture of the proximal right femur. 2. Advanced osteoarthritis of the right knee. Electronically Signed   By: Kerby Moors M.D.   On: 08/19/2019 16:22      Procedures:  PROCEDURE: Intramedullary fixation, Right femur. 08/19/19.   Subjective: Patient is feeling well this am, right leg pain is controlled, no nausea or vomiting, no chest pain or dyspnea. Continue to be very weak and deconditioned.    Discharge  Exam: Vitals:   08/23/19 0734 08/23/19 1304  BP: (!) 152/69 134/60  Pulse: 67 79  Resp: 16 17  Temp: 98.4 F (36.9 C) 98.4 F (36.9 C)  SpO2: 99% 99%   Vitals:   08/22/19 1916 08/23/19 0500 08/23/19 0734 08/23/19 1304  BP: 140/60 140/80 (!) 152/69 134/60  Pulse: 64 75 67 79  Resp: _0 Temp: 99.3 F (37.4 C) 98.8 F (37.1 C) 98.4 F (36.9 C) 98.4 F (36.9 C)  TempSrc: Oral Oral Oral Oral  SpO2: 99% 98% 99% 99%  Weight:      Height:        General: Not in pain or dyspnea, deconditioned  Neurology: Awake and alert, non focal  E ENT: mild pallor, no icterus, oral mucosa moist Cardiovascular: No JVD. S1-S2 present, rhythmic, no gallops, rubs, or murmurs. No lower extremity edema. Pulmonary: positive breath sounds bilaterally, adequate air movement, no wheezing, rhonchi or rales. Gastrointestinal. Abdomen with no organomegaly, non tender, no rebound or guarding Skin. No rashes Musculoskeletal: no joint deformities     The results of significant diagnostics from this hospitalization (including imaging, microbiology, ancillary and laboratory) are listed below for reference.     Microbiology: Recent  Results (from the past 240 hour(s))  SARS CORONAVIRUS 2 (TAT 6-24 HRS) Nasopharyngeal Nasopharyngeal Swab     Status: None   Collection Time: 08/15/19  8:42 PM   Specimen: Nasopharyngeal Swab  Result Value Ref Range Status   SARS Coronavirus 2 NEGATIVE NEGATIVE Final    Comment: (NOTE) SARS-CoV-2 target nucleic acids are NOT DETECTED. The SARS-CoV-2 RNA is generally detectable in upper and lower respiratory specimens during the acute phase of infection. Negative results do not preclude SARS-CoV-2 infection, do not rule out co-infections with other pathogens, and should not be used as the sole basis for treatment or other patient management decisions. Negative results must be combined with clinical observations, patient history, and epidemiological information.  The expected result is Negative. Fact Sheet for Patients: SugarRoll.be Fact Sheet for Healthcare Providers: https://www.woods-mathews.com/ This test is not yet approved or cleared by the [place] FDA and  has been authorized for detection and/or diagnosis of SARS-CoV-2 by FDA under an Emergency Use Authorization (EUA). This EUA will remain  in effect (meaning this test can be used) for the duration of the COVID-19 declaration under Section 56 4(b)(1) of the Act, 21 U.S.C. section 360bbb-3(b)(1), unless the authorization is terminated or revoked sooner. Performed at [facility], [place] [address], [place], [place] [address]   MRSA PCR Screening     Status: None   Collection Time: 08/16/19  8:06 PM   Specimen: Nasal Mucosa; Nasopharyngeal  Result Value Ref Range Status   MRSA by PCR NEGATIVE NEGATIVE Final    Comment:        The GeneXpert MRSA Assay (FDA approved for NASAL specimens only), is one component of a comprehensive MRSA colonization surveillance program. It is not intended to diagnose MRSA infection nor to guide or monitor treatment for MRSA infections. Performed at [facility], [place] [address], [place], [place] [address]   Urine culture     Status: Abnormal   Collection Time: 08/18/19 12:36 AM   Specimen: Urine, Clean Catch  Result Value Ref Range Status   Specimen Description URINE, CLEAN CATCH  Final   Special Requests   Final    NONE Performed at [facility], [place] [address], [place],  [address]    Culture >=100,000 COLONIES/mL ESCHERICHIA COLI (A)  Final   Report Status 08/20/2019 FINAL  Final   Organism ID, Bacteria ESCHERICHIA COLI (A)  Final      Susceptibility   Escherichia coli - MIC*    AMPICILLIN <=2 SENSITIVE Sensitive     CEFAZOLIN <=4 SENSITIVE Sensitive     CEFTRIAXONE <=1 SENSITIVE Sensitive     CIPROFLOXACIN <=0.25 SENSITIVE Sensitive     GENTAMICIN <=1 SENSITIVE Sensitive      IMIPENEM 0.5 SENSITIVE Sensitive     NITROFURANTOIN <=16 SENSITIVE Sensitive     TRIMETH/SULFA <=20 SENSITIVE Sensitive     AMPICILLIN/SULBACTAM <=2 SENSITIVE Sensitive     Extended ESBL NEGATIVE Sensitive     * >=100,000 COLONIES/mL ESCHERICHIA COLI  SARS CORONAVIRUS 2 (TAT 6-24 HRS) Nasopharyngeal Nasopharyngeal Swab     Status: None   Collection Time: 08/21/19  2:17 PM   Specimen: Nasopharyngeal Swab  Result Value Ref Range Status   SARS Coronavirus 2 NEGATIVE NEGATIVE Final    Comment: (NOTE) SARS-CoV-2 target nucleic acids are NOT DETECTED. The SARS-CoV-2 RNA is generally detectable in upper and lower respiratory specimens during the acute phase of infection. Negative results do not preclude SARS-CoV-2 infection, do not rule out  co-infections with other pathogens, and should not be used as the sole basis for treatment or other patient management decisions. Negative results must be combined with clinical observations, patient history, and epidemiological information. The expected result is Negative. Fact Sheet for Patients: SugarRoll.be Fact Sheet for Healthcare Providers: https://www.woods-mathews.com/ This test is not yet approved or cleared by the [place] FDA and  has been authorized for detection and/or diagnosis of SARS-CoV-2 by FDA under an Emergency Use Authorization (EUA). This EUA will remain  in effect (meaning this test can be used) for the duration of the COVID-19 declaration under Section 56 4(b)(1) of the Act, 21 U.S.C. section 360bbb-3(b)(1), unless the authorization is terminated or revoked sooner. Performed at [facility], [place] [address] [place], [place] [address]      Labs: BNP (last 3 results) No results for input(s): BNP in the last 8760 hours. Basic Metabolic Panel: Recent Labs  Lab 08/18/19 0332 08/19/19 0347 08/20/19 0414 08/21/19 0416 08/22/19 0107 08/22/19 0108 08/23/19 0410  NA  135 140 137 137  --  137 136  K 4.3 4.3 5.6* 4.4  --  4.5 4.8  CL 105 110 107 105  --  103 104  CO2 _0 --  25 24  GLUCOSE 281* 173* 362* 148*  --  159* 131*  BUN 57* 45* 45* 52*  --  50* 45*  CREATININE 2.54* 1.61* 1.56* 1.51*  --  1.39* 1.27*  CALCIUM 8.4* 8.6* 8.3* 8.4*  --  8.5* 8.3*  MG  --   --   --   --  2.0  --   --   PHOS 3.2 2.1*  --   --   --  2.0*  --    Liver Function Tests: Recent Labs  Lab 08/18/19 0332 08/19/19 0347 08/22/19 0108  ALBUMIN 2.5* 2.4* 2.2*   No results for input(s): LIPASE, AMYLASE in the last 168 hours. No results for input(s): AMMONIA in the last 168 hours. CBC: Recent Labs  Lab 08/17/19 1100  08/19/19 0347 08/20/19 0414 08/21/19 0416 08/22/19 0107 08/23/19 0410  WBC 16.9*   < > 12.6* 9.8 12.9* 14.4* 14.3*  NEUTROABS 12.5*  --   --   --   --   --   --   HGB 6.5*   < > 9.4* 8.5* 7.8* 8.2* 8.0*  HCT 21.0*   < > 28.9* 26.2* 24.5* 26.2* 25.3*  MCV 92.9   < > 90.0 91.3 93.2 94.6 93.4  PLT 275   < > 212 202 204 251 260   < > = values in this interval not displayed.   Cardiac Enzymes: No results for input(s): CKTOTAL, CKMB, CKMBINDEX, TROPONINI in the last 168 hours. BNP: Invalid input(s): POCBNP CBG: Recent Labs  Lab 08/22/19 1920 08/23/19 0023 08/23/19 0439 08/23/19 0820 08/23/19 1229  GLUCAP 281* 135* 128* 107* 171*   D-Dimer No results for input(s): DDIMER in the last 72 hours. Hgb A1c No results for input(s): HGBA1C in the last 72 hours. Lipid Profile No results for input(s): CHOL, HDL, LDLCALC, TRIG, CHOLHDL, LDLDIRECT in the last 72 hours. Thyroid function studies No results for input(s): TSH, T4TOTAL, T3FREE, THYROIDAB in the last 72 hours.  Invalid input(s): FREET3 Anemia work up No results for input(s): VITAMINB12, FOLATE, FERRITIN, TIBC, IRON, RETICCTPCT in the last 72 hours. Urinalysis    Component Value Date/Time   COLORURINE AMBER (A) 08/17/2019 1022   APPEARANCEUR CLOUDY (A) 08/17/2019 1022  LABSPEC  1.021 08/17/2019 1022   PHURINE 5.0 08/17/2019 1022   GLUCOSEU 150 (A) 08/17/2019 1022   HGBUR MODERATE (A) 08/17/2019 1022   BILIRUBINUR NEGATIVE 08/17/2019 1022   KETONESUR NEGATIVE 08/17/2019 1022   PROTEINUR 30 (A) 08/17/2019 1022   UROBILINOGEN 0.2 02/15/2015 1341   NITRITE NEGATIVE 08/17/2019 1022   LEUKOCYTESUR MODERATE (A) 08/17/2019 1022   Sepsis Labs Invalid input(s): PROCALCITONIN,  WBC,  LACTICIDVEN Microbiology Recent Results (from the past 240 hour(s))  SARS CORONAVIRUS 2 (TAT 6-24 HRS) Nasopharyngeal Nasopharyngeal Swab     Status: None   Collection Time: 08/15/19  8:42 PM   Specimen: Nasopharyngeal Swab  Result Value Ref Range Status   SARS Coronavirus 2 NEGATIVE NEGATIVE Final    Comment: (NOTE) SARS-CoV-2 target nucleic acids are NOT DETECTED. The SARS-CoV-2 RNA is generally detectable in upper and lower respiratory specimens during the acute phase of infection. Negative results do not preclude SARS-CoV-2 infection, do not rule out co-infections with other pathogens, and should not be used as the sole basis for treatment or other patient management decisions. Negative results must be combined with clinical observations, patient history, and epidemiological information. The expected result is Negative. Fact Sheet for Patients: SugarRoll.be Fact Sheet for Healthcare Providers: https://www.woods-mathews.com/ This test is not yet approved or cleared by the [place] FDA and  has been authorized for detection and/or diagnosis of SARS-CoV-2 by FDA under an Emergency Use Authorization (EUA). This EUA will remain  in effect (meaning this test can be used) for the duration of the COVID-19 declaration under Section 56 4(b)(1) of the Act, 21 U.S.C. section 360bbb-3(b)(1), unless the authorization is terminated or revoked sooner. Performed at [facility], [place] [address] [place], [place] [address]   MRSA PCR  Screening     Status: None   Collection Time: 08/16/19  8:06 PM   Specimen: Nasal Mucosa; Nasopharyngeal  Result Value Ref Range Status   MRSA by PCR NEGATIVE NEGATIVE Final    Comment:        The GeneXpert MRSA Assay (FDA approved for NASAL specimens only), is one component of a comprehensive MRSA colonization surveillance program. It is not intended to diagnose MRSA infection nor to guide or monitor treatment for MRSA infections. Performed at [facility], [place] [address], [place], [place] [address]   Urine culture     Status: Abnormal   Collection Time: 08/18/19 12:36 AM   Specimen: Urine, Clean Catch  Result Value Ref Range Status   Specimen Description URINE, CLEAN CATCH  Final   Special Requests   Final    NONE Performed at [facility], [place] [address] [place],  [address]    Culture >=100,000 COLONIES/mL ESCHERICHIA COLI (A)  Final   Report Status 08/20/2019 FINAL  Final   Organism ID, Bacteria ESCHERICHIA COLI (A)  Final      Susceptibility   Escherichia coli - MIC*    AMPICILLIN <=2 SENSITIVE Sensitive     CEFAZOLIN <=4 SENSITIVE Sensitive     CEFTRIAXONE <=1 SENSITIVE Sensitive     CIPROFLOXACIN <=0.25 SENSITIVE Sensitive     GENTAMICIN <=1 SENSITIVE Sensitive     IMIPENEM 0.5 SENSITIVE Sensitive     NITROFURANTOIN <=16 SENSITIVE Sensitive     TRIMETH/SULFA <=20 SENSITIVE Sensitive     AMPICILLIN/SULBACTAM <=2 SENSITIVE Sensitive     Extended ESBL NEGATIVE Sensitive     * >=100,000 COLONIES/mL ESCHERICHIA COLI  SARS CORONAVIRUS 2 (TAT 6-24 HRS) Nasopharyngeal Nasopharyngeal  Swab     Status: None   Collection Time: 08/21/19  2:17 PM   Specimen: Nasopharyngeal Swab  Result Value Ref Range Status   SARS Coronavirus 2 NEGATIVE NEGATIVE Final    Comment: (NOTE) SARS-CoV-2 target nucleic acids are NOT DETECTED. The SARS-CoV-2 RNA is generally detectable in upper and lower respiratory specimens during the acute phase of infection.  Negative results do not preclude SARS-CoV-2 infection, do not rule out co-infections with other pathogens, and should not be used as the sole basis for treatment or other patient management decisions. Negative results must be combined with clinical observations, patient history, and epidemiological information. The expected result is Negative. Fact Sheet for Patients: SugarRoll.be Fact Sheet for Healthcare Providers: https://www.woods-mathews.com/ This test is not yet approved or cleared by the [place] FDA and  has been authorized for detection and/or diagnosis of SARS-CoV-2 by FDA under an Emergency Use Authorization (EUA). This EUA will remain  in effect (meaning this test can be used) for the duration of the COVID-19 declaration under Section 56 4(b)(1) of the Act, 21 U.S.C. section 360bbb-3(b)(1), unless the authorization is terminated or revoked sooner. Performed at [facility], [place] [address] [place], [place] [address]      Time coordinating discharge: 45 minutes  SIGNED:   Tawni Millers, MD  [facility] 08/23/2019, 1:11 PM

## 2019-08-23 NOTE — Plan of Care (Signed)

## 2019-08-23 NOTE — TOC Transition Note (Signed)
Transition of Care [facility]) - CM/SW Discharge Note   Patient Details  Name: Jenny Glover MRN: [id number] Date of Birth: 04-26-1946  Transition of Care [facility]) CM/SW Contact:  Midge Minium MSN, RN, NCM-BC, ACM-RN [phone] Phone Number: 08/23/2019, 3:21 PM   Clinical Narrative:    The patient is medically stable to transition to [facility] for rehab with insurance auth received and a bed available for today. CM updated the patients spouse and daughter Seth Bake) of the [place], with both agreeable. PTAR will be arranged for 1630.  Please call report to: [phone]; room 108p    Final next level of care: Skilled Nursing Facility Barriers to Discharge: No Barriers Identified   Patient Goals and CMS Choice Patient states their goals for this hospitalization and ongoing recovery are:: patient unable to state CMS Medicare.gov Compare Post Acute Care list provided to:: Patient Represenative (must comment)(Donald Bambach (provided to spouse)) Choice offered to / list presented to : Spouse(Donald Shiffman (provided to spouse))  Discharge Placement              Patient chooses bed at: [facility] Patient to be transferred to facility by: PTAR Name of family member notified: Mahira Petras (spouse); Seth Bake (daughter) Patient and family notified of of transfer: 08/23/19  Discharge Plan and Services In-house Referral: Clinical Social Work Discharge Planning Services: CM Consult Post Acute Care Choice: [place]          DME Arranged: N/A DME Agency: NA       HH Arranged: NA HH Agency: NA        Social Determinants of Health (SDOH) Interventions     Readmission Risk Interventions No flowsheet data found.

## 2019-08-23 NOTE — Progress Notes (Signed)
Pt report given to receiving RN at [employer].Pt is going to room 108.just awaiting  PTAR for transport.Husband is present in the room.

## 2019-08-23 NOTE — Progress Notes (Signed)
Pt stable. Pt still confused. Constantly pulling on lines. Pt pulled IV on day shift. No IV currently. Will continue to monitor.

## 2019-08-24 DIAGNOSIS — D500 Iron deficiency anemia secondary to blood loss (chronic): Secondary | ICD-10-CM | POA: Diagnosis not present

## 2019-08-24 DIAGNOSIS — I10 Essential (primary) hypertension: Secondary | ICD-10-CM | POA: Diagnosis not present

## 2019-08-24 DIAGNOSIS — I4821 Permanent atrial fibrillation: Secondary | ICD-10-CM | POA: Diagnosis not present

## 2019-08-24 DIAGNOSIS — Z8781 Personal history of (healed) traumatic fracture: Secondary | ICD-10-CM | POA: Diagnosis not present

## 2019-08-28 DIAGNOSIS — S72141D Displaced intertrochanteric fracture of right femur, subsequent encounter for closed fracture with routine healing: Secondary | ICD-10-CM | POA: Diagnosis not present

## 2019-08-28 DIAGNOSIS — E1165 Type 2 diabetes mellitus with hyperglycemia: Secondary | ICD-10-CM | POA: Diagnosis not present

## 2019-08-28 DIAGNOSIS — F0390 Unspecified dementia without behavioral disturbance: Secondary | ICD-10-CM | POA: Diagnosis not present

## 2019-08-28 DIAGNOSIS — R600 Localized edema: Secondary | ICD-10-CM | POA: Diagnosis not present

## 2019-08-30 DIAGNOSIS — S72141D Displaced intertrochanteric fracture of right femur, subsequent encounter for closed fracture with routine healing: Secondary | ICD-10-CM | POA: Diagnosis not present

## 2019-08-30 DIAGNOSIS — E1165 Type 2 diabetes mellitus with hyperglycemia: Secondary | ICD-10-CM | POA: Diagnosis not present

## 2019-09-20 DIAGNOSIS — M6281 Muscle weakness (generalized): Secondary | ICD-10-CM | POA: Diagnosis not present

## 2019-09-20 DIAGNOSIS — Z4802 Encounter for removal of sutures: Secondary | ICD-10-CM | POA: Diagnosis not present

## 2019-09-20 DIAGNOSIS — E1165 Type 2 diabetes mellitus with hyperglycemia: Secondary | ICD-10-CM | POA: Diagnosis not present

## 2019-09-20 DIAGNOSIS — S72141D Displaced intertrochanteric fracture of right femur, subsequent encounter for closed fracture with routine healing: Secondary | ICD-10-CM | POA: Diagnosis not present

## 2019-09-20 DIAGNOSIS — F0390 Unspecified dementia without behavioral disturbance: Secondary | ICD-10-CM | POA: Diagnosis not present

## 2019-09-28 DIAGNOSIS — R600 Localized edema: Secondary | ICD-10-CM | POA: Diagnosis not present

## 2019-09-28 DIAGNOSIS — M6281 Muscle weakness (generalized): Secondary | ICD-10-CM | POA: Diagnosis not present

## 2019-09-28 DIAGNOSIS — S72141D Displaced intertrochanteric fracture of right femur, subsequent encounter for closed fracture with routine healing: Secondary | ICD-10-CM | POA: Diagnosis not present

## 2019-09-28 DIAGNOSIS — F0390 Unspecified dementia without behavioral disturbance: Secondary | ICD-10-CM | POA: Diagnosis not present

## 2019-09-28 DIAGNOSIS — E1165 Type 2 diabetes mellitus with hyperglycemia: Secondary | ICD-10-CM | POA: Diagnosis not present

## 2019-09-28 DIAGNOSIS — Z4802 Encounter for removal of sutures: Secondary | ICD-10-CM | POA: Diagnosis not present

## 2019-10-06 DIAGNOSIS — Z4802 Encounter for removal of sutures: Secondary | ICD-10-CM | POA: Diagnosis not present

## 2019-10-06 DIAGNOSIS — F0390 Unspecified dementia without behavioral disturbance: Secondary | ICD-10-CM | POA: Diagnosis not present

## 2019-10-06 DIAGNOSIS — E1165 Type 2 diabetes mellitus with hyperglycemia: Secondary | ICD-10-CM | POA: Diagnosis not present

## 2019-10-06 DIAGNOSIS — M6281 Muscle weakness (generalized): Secondary | ICD-10-CM | POA: Diagnosis not present

## 2019-10-06 DIAGNOSIS — S72141D Displaced intertrochanteric fracture of right femur, subsequent encounter for closed fracture with routine healing: Secondary | ICD-10-CM | POA: Diagnosis not present

## 2019-10-06 DIAGNOSIS — R600 Localized edema: Secondary | ICD-10-CM | POA: Diagnosis not present

## 2019-10-09 DIAGNOSIS — F0390 Unspecified dementia without behavioral disturbance: Secondary | ICD-10-CM | POA: Diagnosis not present

## 2019-10-09 DIAGNOSIS — Z4802 Encounter for removal of sutures: Secondary | ICD-10-CM | POA: Diagnosis not present

## 2019-10-09 DIAGNOSIS — E1165 Type 2 diabetes mellitus with hyperglycemia: Secondary | ICD-10-CM | POA: Diagnosis not present

## 2019-10-09 DIAGNOSIS — S72141D Displaced intertrochanteric fracture of right femur, subsequent encounter for closed fracture with routine healing: Secondary | ICD-10-CM | POA: Diagnosis not present

## 2019-10-09 DIAGNOSIS — R600 Localized edema: Secondary | ICD-10-CM | POA: Diagnosis not present

## 2019-10-09 DIAGNOSIS — M6281 Muscle weakness (generalized): Secondary | ICD-10-CM | POA: Diagnosis not present

## 2019-10-10 DIAGNOSIS — E1165 Type 2 diabetes mellitus with hyperglycemia: Secondary | ICD-10-CM | POA: Diagnosis not present

## 2019-10-10 DIAGNOSIS — R600 Localized edema: Secondary | ICD-10-CM | POA: Diagnosis not present

## 2019-10-10 DIAGNOSIS — F0390 Unspecified dementia without behavioral disturbance: Secondary | ICD-10-CM | POA: Diagnosis not present

## 2019-10-11 DIAGNOSIS — R600 Localized edema: Secondary | ICD-10-CM | POA: Diagnosis not present

## 2019-10-11 DIAGNOSIS — F0390 Unspecified dementia without behavioral disturbance: Secondary | ICD-10-CM | POA: Diagnosis not present

## 2019-10-11 DIAGNOSIS — Z4802 Encounter for removal of sutures: Secondary | ICD-10-CM | POA: Diagnosis not present

## 2019-10-11 DIAGNOSIS — M6281 Muscle weakness (generalized): Secondary | ICD-10-CM | POA: Diagnosis not present

## 2019-10-11 DIAGNOSIS — E1165 Type 2 diabetes mellitus with hyperglycemia: Secondary | ICD-10-CM | POA: Diagnosis not present

## 2019-10-11 DIAGNOSIS — S72141D Displaced intertrochanteric fracture of right femur, subsequent encounter for closed fracture with routine healing: Secondary | ICD-10-CM | POA: Diagnosis not present

## 2019-10-20 DIAGNOSIS — F0390 Unspecified dementia without behavioral disturbance: Secondary | ICD-10-CM | POA: Diagnosis not present

## 2019-10-20 DIAGNOSIS — E1165 Type 2 diabetes mellitus with hyperglycemia: Secondary | ICD-10-CM | POA: Diagnosis not present

## 2019-10-20 DIAGNOSIS — R262 Difficulty in walking, not elsewhere classified: Secondary | ICD-10-CM | POA: Diagnosis not present

## 2019-10-20 DIAGNOSIS — I2510 Atherosclerotic heart disease of native coronary artery without angina pectoris: Secondary | ICD-10-CM | POA: Diagnosis not present

## 2019-10-20 DIAGNOSIS — S72141D Displaced intertrochanteric fracture of right femur, subsequent encounter for closed fracture with routine healing: Secondary | ICD-10-CM | POA: Diagnosis not present

## 2019-10-20 DIAGNOSIS — M6281 Muscle weakness (generalized): Secondary | ICD-10-CM | POA: Diagnosis not present

## 2019-10-20 DIAGNOSIS — G4733 Obstructive sleep apnea (adult) (pediatric): Secondary | ICD-10-CM | POA: Diagnosis not present

## 2019-10-20 DIAGNOSIS — I482 Chronic atrial fibrillation, unspecified: Secondary | ICD-10-CM | POA: Diagnosis not present

## 2019-10-20 DIAGNOSIS — I10 Essential (primary) hypertension: Secondary | ICD-10-CM | POA: Diagnosis not present

## 2019-11-01 DIAGNOSIS — I10 Essential (primary) hypertension: Secondary | ICD-10-CM | POA: Diagnosis not present

## 2019-11-01 DIAGNOSIS — M6281 Muscle weakness (generalized): Secondary | ICD-10-CM | POA: Diagnosis not present

## 2019-11-01 DIAGNOSIS — F0390 Unspecified dementia without behavioral disturbance: Secondary | ICD-10-CM | POA: Diagnosis not present

## 2019-11-01 DIAGNOSIS — S72141D Displaced intertrochanteric fracture of right femur, subsequent encounter for closed fracture with routine healing: Secondary | ICD-10-CM | POA: Diagnosis not present

## 2019-11-01 DIAGNOSIS — I2510 Atherosclerotic heart disease of native coronary artery without angina pectoris: Secondary | ICD-10-CM | POA: Diagnosis not present

## 2019-11-01 DIAGNOSIS — I482 Chronic atrial fibrillation, unspecified: Secondary | ICD-10-CM | POA: Diagnosis not present

## 2019-11-01 DIAGNOSIS — E1165 Type 2 diabetes mellitus with hyperglycemia: Secondary | ICD-10-CM | POA: Diagnosis not present

## 2019-11-03 ENCOUNTER — Other Ambulatory Visit: Payer: Self-pay

## 2019-11-03 NOTE — Patient Outreach (Signed)
[facility]) Care Management  11/03/2019  Jenny Glover 04/06/1946 [id number]     Transition of Care Referral  Referral Date: 11/03/2019 Referral Source: [facility] Discharge Date of Discharge: 11/01/2019 Facility: [facility] Insurance: [facility]   Referral received. Transition of care calls being completed via EMMI-automated calls. RN CM will outreach patient for any red flags received.    Plan: RN CM will close case at this time.   Enzo Montgomery, RN,BSN,CCM [place] Management Telephonic Care Management Coordinator Direct Phone: [phone] Toll Free: [phone] Fax: [phone]

## 2019-11-05 DIAGNOSIS — I2510 Atherosclerotic heart disease of native coronary artery without angina pectoris: Secondary | ICD-10-CM | POA: Diagnosis not present

## 2019-11-05 DIAGNOSIS — E1122 Type 2 diabetes mellitus with diabetic chronic kidney disease: Secondary | ICD-10-CM | POA: Diagnosis not present

## 2019-11-05 DIAGNOSIS — D631 Anemia in chronic kidney disease: Secondary | ICD-10-CM | POA: Diagnosis not present

## 2019-11-05 DIAGNOSIS — S72141D Displaced intertrochanteric fracture of right femur, subsequent encounter for closed fracture with routine healing: Secondary | ICD-10-CM | POA: Diagnosis not present

## 2019-11-05 DIAGNOSIS — E1151 Type 2 diabetes mellitus with diabetic peripheral angiopathy without gangrene: Secondary | ICD-10-CM | POA: Diagnosis not present

## 2019-11-05 DIAGNOSIS — I1310 Hypertensive heart and chronic kidney disease without heart failure, with stage 1 through stage 4 chronic kidney disease, or unspecified chronic kidney disease: Secondary | ICD-10-CM | POA: Diagnosis not present

## 2019-11-05 DIAGNOSIS — I350 Nonrheumatic aortic (valve) stenosis: Secondary | ICD-10-CM | POA: Diagnosis not present

## 2019-11-05 DIAGNOSIS — N1832 Chronic kidney disease, stage 3b: Secondary | ICD-10-CM | POA: Diagnosis not present

## 2019-11-05 DIAGNOSIS — I482 Chronic atrial fibrillation, unspecified: Secondary | ICD-10-CM | POA: Diagnosis not present

## 2019-11-07 ENCOUNTER — Other Ambulatory Visit: Payer: Self-pay

## 2019-11-07 DIAGNOSIS — E1122 Type 2 diabetes mellitus with diabetic chronic kidney disease: Secondary | ICD-10-CM | POA: Diagnosis not present

## 2019-11-07 DIAGNOSIS — I482 Chronic atrial fibrillation, unspecified: Secondary | ICD-10-CM | POA: Diagnosis not present

## 2019-11-07 DIAGNOSIS — E1151 Type 2 diabetes mellitus with diabetic peripheral angiopathy without gangrene: Secondary | ICD-10-CM | POA: Diagnosis not present

## 2019-11-07 DIAGNOSIS — I1310 Hypertensive heart and chronic kidney disease without heart failure, with stage 1 through stage 4 chronic kidney disease, or unspecified chronic kidney disease: Secondary | ICD-10-CM | POA: Diagnosis not present

## 2019-11-07 DIAGNOSIS — I350 Nonrheumatic aortic (valve) stenosis: Secondary | ICD-10-CM | POA: Diagnosis not present

## 2019-11-07 DIAGNOSIS — N1832 Chronic kidney disease, stage 3b: Secondary | ICD-10-CM | POA: Diagnosis not present

## 2019-11-07 DIAGNOSIS — I2510 Atherosclerotic heart disease of native coronary artery without angina pectoris: Secondary | ICD-10-CM | POA: Diagnosis not present

## 2019-11-07 DIAGNOSIS — S72141D Displaced intertrochanteric fracture of right femur, subsequent encounter for closed fracture with routine healing: Secondary | ICD-10-CM | POA: Diagnosis not present

## 2019-11-07 DIAGNOSIS — D631 Anemia in chronic kidney disease: Secondary | ICD-10-CM | POA: Diagnosis not present

## 2019-11-07 NOTE — Patient Outreach (Signed)
[facility]) Care Management  11/07/2019  Jenny Glover 1946/02/02 [id number]    EMMI-General Discharge RED ON EMMI ALERT Day # 4 Date: 11/05/2019 Red Alert Reason: " Know who to call about changes in condition? No Scheduled follow up? No Lost interest in things? Yes Sad/hopeless/anxious/empty? Yes  Other questions/problems? Yes"   Outreach attempt # 1 to patient. Spoke with spouse who reported he handles patient's affairs as she has dementia. Reviewed and addressed red alerts. Spouse does not recall receiving automated call and responding. He voices that he knows who to contact. He has called PCP office yesterday and left message with nurse and is awaiting return call regarding appt. He confirms that [facility] services in place. RN was out to see patient over the weekend and therapist coming today. Spouse confirms he has all of patient's meds and knows when,how and why to give to her. He denies any RN CM needs or concerns at this time and declines needs for follow up. He is aware that he can call for any future needs or concerns. Patient has completed post discharge automated calls.     Plan: RN CM will close case at this time.   Enzo Montgomery, RN,BSN,CCM [place] Management Telephonic Care Management Coordinator Direct Phone: [phone] Toll Free: [phone] Fax: [phone]

## 2019-11-14 DIAGNOSIS — S72141D Displaced intertrochanteric fracture of right femur, subsequent encounter for closed fracture with routine healing: Secondary | ICD-10-CM | POA: Diagnosis not present

## 2019-11-15 DIAGNOSIS — E1151 Type 2 diabetes mellitus with diabetic peripheral angiopathy without gangrene: Secondary | ICD-10-CM | POA: Diagnosis not present

## 2019-11-15 DIAGNOSIS — E1122 Type 2 diabetes mellitus with diabetic chronic kidney disease: Secondary | ICD-10-CM | POA: Diagnosis not present

## 2019-11-15 DIAGNOSIS — I2510 Atherosclerotic heart disease of native coronary artery without angina pectoris: Secondary | ICD-10-CM | POA: Diagnosis not present

## 2019-11-15 DIAGNOSIS — I482 Chronic atrial fibrillation, unspecified: Secondary | ICD-10-CM | POA: Diagnosis not present

## 2019-11-15 DIAGNOSIS — I350 Nonrheumatic aortic (valve) stenosis: Secondary | ICD-10-CM | POA: Diagnosis not present

## 2019-11-15 DIAGNOSIS — S72141D Displaced intertrochanteric fracture of right femur, subsequent encounter for closed fracture with routine healing: Secondary | ICD-10-CM | POA: Diagnosis not present

## 2019-11-15 DIAGNOSIS — D631 Anemia in chronic kidney disease: Secondary | ICD-10-CM | POA: Diagnosis not present

## 2019-11-15 DIAGNOSIS — I1310 Hypertensive heart and chronic kidney disease without heart failure, with stage 1 through stage 4 chronic kidney disease, or unspecified chronic kidney disease: Secondary | ICD-10-CM | POA: Diagnosis not present

## 2019-11-15 DIAGNOSIS — N1832 Chronic kidney disease, stage 3b: Secondary | ICD-10-CM | POA: Diagnosis not present

## 2019-11-22 DIAGNOSIS — D631 Anemia in chronic kidney disease: Secondary | ICD-10-CM | POA: Diagnosis not present

## 2019-11-22 DIAGNOSIS — I2510 Atherosclerotic heart disease of native coronary artery without angina pectoris: Secondary | ICD-10-CM | POA: Diagnosis not present

## 2019-11-22 DIAGNOSIS — S72141D Displaced intertrochanteric fracture of right femur, subsequent encounter for closed fracture with routine healing: Secondary | ICD-10-CM | POA: Diagnosis not present

## 2019-11-22 DIAGNOSIS — E1122 Type 2 diabetes mellitus with diabetic chronic kidney disease: Secondary | ICD-10-CM | POA: Diagnosis not present

## 2019-11-22 DIAGNOSIS — I350 Nonrheumatic aortic (valve) stenosis: Secondary | ICD-10-CM | POA: Diagnosis not present

## 2019-11-22 DIAGNOSIS — I1310 Hypertensive heart and chronic kidney disease without heart failure, with stage 1 through stage 4 chronic kidney disease, or unspecified chronic kidney disease: Secondary | ICD-10-CM | POA: Diagnosis not present

## 2019-11-22 DIAGNOSIS — N1832 Chronic kidney disease, stage 3b: Secondary | ICD-10-CM | POA: Diagnosis not present

## 2019-11-22 DIAGNOSIS — I482 Chronic atrial fibrillation, unspecified: Secondary | ICD-10-CM | POA: Diagnosis not present

## 2019-11-22 DIAGNOSIS — E1151 Type 2 diabetes mellitus with diabetic peripheral angiopathy without gangrene: Secondary | ICD-10-CM | POA: Diagnosis not present

## 2019-11-23 DIAGNOSIS — I2510 Atherosclerotic heart disease of native coronary artery without angina pectoris: Secondary | ICD-10-CM | POA: Diagnosis not present

## 2019-11-23 DIAGNOSIS — S72141D Displaced intertrochanteric fracture of right femur, subsequent encounter for closed fracture with routine healing: Secondary | ICD-10-CM | POA: Diagnosis not present

## 2019-11-23 DIAGNOSIS — I1310 Hypertensive heart and chronic kidney disease without heart failure, with stage 1 through stage 4 chronic kidney disease, or unspecified chronic kidney disease: Secondary | ICD-10-CM | POA: Diagnosis not present

## 2019-11-23 DIAGNOSIS — I350 Nonrheumatic aortic (valve) stenosis: Secondary | ICD-10-CM | POA: Diagnosis not present

## 2019-11-23 DIAGNOSIS — E1122 Type 2 diabetes mellitus with diabetic chronic kidney disease: Secondary | ICD-10-CM | POA: Diagnosis not present

## 2019-11-23 DIAGNOSIS — E1151 Type 2 diabetes mellitus with diabetic peripheral angiopathy without gangrene: Secondary | ICD-10-CM | POA: Diagnosis not present

## 2019-11-23 DIAGNOSIS — N1832 Chronic kidney disease, stage 3b: Secondary | ICD-10-CM | POA: Diagnosis not present

## 2019-11-23 DIAGNOSIS — I482 Chronic atrial fibrillation, unspecified: Secondary | ICD-10-CM | POA: Diagnosis not present

## 2019-11-23 DIAGNOSIS — D631 Anemia in chronic kidney disease: Secondary | ICD-10-CM | POA: Diagnosis not present

## 2019-11-24 DIAGNOSIS — S72141D Displaced intertrochanteric fracture of right femur, subsequent encounter for closed fracture with routine healing: Secondary | ICD-10-CM | POA: Diagnosis not present

## 2019-11-24 DIAGNOSIS — I482 Chronic atrial fibrillation, unspecified: Secondary | ICD-10-CM | POA: Diagnosis not present

## 2019-11-24 DIAGNOSIS — E1151 Type 2 diabetes mellitus with diabetic peripheral angiopathy without gangrene: Secondary | ICD-10-CM | POA: Diagnosis not present

## 2019-11-24 DIAGNOSIS — N1832 Chronic kidney disease, stage 3b: Secondary | ICD-10-CM | POA: Diagnosis not present

## 2019-11-24 DIAGNOSIS — E1122 Type 2 diabetes mellitus with diabetic chronic kidney disease: Secondary | ICD-10-CM | POA: Diagnosis not present

## 2019-11-24 DIAGNOSIS — D631 Anemia in chronic kidney disease: Secondary | ICD-10-CM | POA: Diagnosis not present

## 2019-11-24 DIAGNOSIS — I350 Nonrheumatic aortic (valve) stenosis: Secondary | ICD-10-CM | POA: Diagnosis not present

## 2019-11-24 DIAGNOSIS — I1310 Hypertensive heart and chronic kidney disease without heart failure, with stage 1 through stage 4 chronic kidney disease, or unspecified chronic kidney disease: Secondary | ICD-10-CM | POA: Diagnosis not present

## 2019-11-24 DIAGNOSIS — I2510 Atherosclerotic heart disease of native coronary artery without angina pectoris: Secondary | ICD-10-CM | POA: Diagnosis not present

## 2019-11-28 DIAGNOSIS — E1151 Type 2 diabetes mellitus with diabetic peripheral angiopathy without gangrene: Secondary | ICD-10-CM | POA: Diagnosis not present

## 2019-11-28 DIAGNOSIS — S72141D Displaced intertrochanteric fracture of right femur, subsequent encounter for closed fracture with routine healing: Secondary | ICD-10-CM | POA: Diagnosis not present

## 2019-11-28 DIAGNOSIS — D631 Anemia in chronic kidney disease: Secondary | ICD-10-CM | POA: Diagnosis not present

## 2019-11-28 DIAGNOSIS — E1122 Type 2 diabetes mellitus with diabetic chronic kidney disease: Secondary | ICD-10-CM | POA: Diagnosis not present

## 2019-11-28 DIAGNOSIS — I482 Chronic atrial fibrillation, unspecified: Secondary | ICD-10-CM | POA: Diagnosis not present

## 2019-11-28 DIAGNOSIS — I2510 Atherosclerotic heart disease of native coronary artery without angina pectoris: Secondary | ICD-10-CM | POA: Diagnosis not present

## 2019-11-28 DIAGNOSIS — N1832 Chronic kidney disease, stage 3b: Secondary | ICD-10-CM | POA: Diagnosis not present

## 2019-11-28 DIAGNOSIS — I350 Nonrheumatic aortic (valve) stenosis: Secondary | ICD-10-CM | POA: Diagnosis not present

## 2019-11-28 DIAGNOSIS — I1310 Hypertensive heart and chronic kidney disease without heart failure, with stage 1 through stage 4 chronic kidney disease, or unspecified chronic kidney disease: Secondary | ICD-10-CM | POA: Diagnosis not present

## 2019-11-29 DIAGNOSIS — E1151 Type 2 diabetes mellitus with diabetic peripheral angiopathy without gangrene: Secondary | ICD-10-CM | POA: Diagnosis not present

## 2019-11-29 DIAGNOSIS — I2510 Atherosclerotic heart disease of native coronary artery without angina pectoris: Secondary | ICD-10-CM | POA: Diagnosis not present

## 2019-11-29 DIAGNOSIS — D631 Anemia in chronic kidney disease: Secondary | ICD-10-CM | POA: Diagnosis not present

## 2019-11-29 DIAGNOSIS — I1310 Hypertensive heart and chronic kidney disease without heart failure, with stage 1 through stage 4 chronic kidney disease, or unspecified chronic kidney disease: Secondary | ICD-10-CM | POA: Diagnosis not present

## 2019-11-29 DIAGNOSIS — S72141D Displaced intertrochanteric fracture of right femur, subsequent encounter for closed fracture with routine healing: Secondary | ICD-10-CM | POA: Diagnosis not present

## 2019-11-29 DIAGNOSIS — N1832 Chronic kidney disease, stage 3b: Secondary | ICD-10-CM | POA: Diagnosis not present

## 2019-11-29 DIAGNOSIS — E1122 Type 2 diabetes mellitus with diabetic chronic kidney disease: Secondary | ICD-10-CM | POA: Diagnosis not present

## 2019-11-29 DIAGNOSIS — M6281 Muscle weakness (generalized): Secondary | ICD-10-CM | POA: Diagnosis not present

## 2019-11-29 DIAGNOSIS — I482 Chronic atrial fibrillation, unspecified: Secondary | ICD-10-CM | POA: Diagnosis not present

## 2019-11-29 DIAGNOSIS — I350 Nonrheumatic aortic (valve) stenosis: Secondary | ICD-10-CM | POA: Diagnosis not present

## 2019-11-30 DIAGNOSIS — I350 Nonrheumatic aortic (valve) stenosis: Secondary | ICD-10-CM | POA: Diagnosis not present

## 2019-11-30 DIAGNOSIS — D631 Anemia in chronic kidney disease: Secondary | ICD-10-CM | POA: Diagnosis not present

## 2019-11-30 DIAGNOSIS — E1122 Type 2 diabetes mellitus with diabetic chronic kidney disease: Secondary | ICD-10-CM | POA: Diagnosis not present

## 2019-11-30 DIAGNOSIS — E1151 Type 2 diabetes mellitus with diabetic peripheral angiopathy without gangrene: Secondary | ICD-10-CM | POA: Diagnosis not present

## 2019-11-30 DIAGNOSIS — N1832 Chronic kidney disease, stage 3b: Secondary | ICD-10-CM | POA: Diagnosis not present

## 2019-11-30 DIAGNOSIS — I1310 Hypertensive heart and chronic kidney disease without heart failure, with stage 1 through stage 4 chronic kidney disease, or unspecified chronic kidney disease: Secondary | ICD-10-CM | POA: Diagnosis not present

## 2019-11-30 DIAGNOSIS — I2510 Atherosclerotic heart disease of native coronary artery without angina pectoris: Secondary | ICD-10-CM | POA: Diagnosis not present

## 2019-11-30 DIAGNOSIS — I482 Chronic atrial fibrillation, unspecified: Secondary | ICD-10-CM | POA: Diagnosis not present

## 2019-11-30 DIAGNOSIS — S72141D Displaced intertrochanteric fracture of right femur, subsequent encounter for closed fracture with routine healing: Secondary | ICD-10-CM | POA: Diagnosis not present

## 2019-12-04 DIAGNOSIS — I2510 Atherosclerotic heart disease of native coronary artery without angina pectoris: Secondary | ICD-10-CM | POA: Diagnosis not present

## 2019-12-04 DIAGNOSIS — S72141D Displaced intertrochanteric fracture of right femur, subsequent encounter for closed fracture with routine healing: Secondary | ICD-10-CM | POA: Diagnosis not present

## 2019-12-04 DIAGNOSIS — E1151 Type 2 diabetes mellitus with diabetic peripheral angiopathy without gangrene: Secondary | ICD-10-CM | POA: Diagnosis not present

## 2019-12-04 DIAGNOSIS — I1310 Hypertensive heart and chronic kidney disease without heart failure, with stage 1 through stage 4 chronic kidney disease, or unspecified chronic kidney disease: Secondary | ICD-10-CM | POA: Diagnosis not present

## 2019-12-04 DIAGNOSIS — I350 Nonrheumatic aortic (valve) stenosis: Secondary | ICD-10-CM | POA: Diagnosis not present

## 2019-12-04 DIAGNOSIS — I482 Chronic atrial fibrillation, unspecified: Secondary | ICD-10-CM | POA: Diagnosis not present

## 2019-12-04 DIAGNOSIS — N1832 Chronic kidney disease, stage 3b: Secondary | ICD-10-CM | POA: Diagnosis not present

## 2019-12-04 DIAGNOSIS — E1122 Type 2 diabetes mellitus with diabetic chronic kidney disease: Secondary | ICD-10-CM | POA: Diagnosis not present

## 2019-12-04 DIAGNOSIS — D631 Anemia in chronic kidney disease: Secondary | ICD-10-CM | POA: Diagnosis not present

## 2019-12-05 DIAGNOSIS — E1122 Type 2 diabetes mellitus with diabetic chronic kidney disease: Secondary | ICD-10-CM | POA: Diagnosis not present

## 2019-12-05 DIAGNOSIS — S72141D Displaced intertrochanteric fracture of right femur, subsequent encounter for closed fracture with routine healing: Secondary | ICD-10-CM | POA: Diagnosis not present

## 2019-12-05 DIAGNOSIS — N1832 Chronic kidney disease, stage 3b: Secondary | ICD-10-CM | POA: Diagnosis not present

## 2019-12-05 DIAGNOSIS — I350 Nonrheumatic aortic (valve) stenosis: Secondary | ICD-10-CM | POA: Diagnosis not present

## 2019-12-05 DIAGNOSIS — I2510 Atherosclerotic heart disease of native coronary artery without angina pectoris: Secondary | ICD-10-CM | POA: Diagnosis not present

## 2019-12-05 DIAGNOSIS — I482 Chronic atrial fibrillation, unspecified: Secondary | ICD-10-CM | POA: Diagnosis not present

## 2019-12-05 DIAGNOSIS — I1310 Hypertensive heart and chronic kidney disease without heart failure, with stage 1 through stage 4 chronic kidney disease, or unspecified chronic kidney disease: Secondary | ICD-10-CM | POA: Diagnosis not present

## 2019-12-05 DIAGNOSIS — E1151 Type 2 diabetes mellitus with diabetic peripheral angiopathy without gangrene: Secondary | ICD-10-CM | POA: Diagnosis not present

## 2019-12-05 DIAGNOSIS — D631 Anemia in chronic kidney disease: Secondary | ICD-10-CM | POA: Diagnosis not present

## 2019-12-07 DIAGNOSIS — I2510 Atherosclerotic heart disease of native coronary artery without angina pectoris: Secondary | ICD-10-CM | POA: Diagnosis not present

## 2019-12-07 DIAGNOSIS — E1151 Type 2 diabetes mellitus with diabetic peripheral angiopathy without gangrene: Secondary | ICD-10-CM | POA: Diagnosis not present

## 2019-12-07 DIAGNOSIS — I350 Nonrheumatic aortic (valve) stenosis: Secondary | ICD-10-CM | POA: Diagnosis not present

## 2019-12-07 DIAGNOSIS — S72141D Displaced intertrochanteric fracture of right femur, subsequent encounter for closed fracture with routine healing: Secondary | ICD-10-CM | POA: Diagnosis not present

## 2019-12-07 DIAGNOSIS — I482 Chronic atrial fibrillation, unspecified: Secondary | ICD-10-CM | POA: Diagnosis not present

## 2019-12-07 DIAGNOSIS — D631 Anemia in chronic kidney disease: Secondary | ICD-10-CM | POA: Diagnosis not present

## 2019-12-07 DIAGNOSIS — N1832 Chronic kidney disease, stage 3b: Secondary | ICD-10-CM | POA: Diagnosis not present

## 2019-12-07 DIAGNOSIS — I1310 Hypertensive heart and chronic kidney disease without heart failure, with stage 1 through stage 4 chronic kidney disease, or unspecified chronic kidney disease: Secondary | ICD-10-CM | POA: Diagnosis not present

## 2019-12-07 DIAGNOSIS — E1122 Type 2 diabetes mellitus with diabetic chronic kidney disease: Secondary | ICD-10-CM | POA: Diagnosis not present

## 2019-12-11 DIAGNOSIS — I1310 Hypertensive heart and chronic kidney disease without heart failure, with stage 1 through stage 4 chronic kidney disease, or unspecified chronic kidney disease: Secondary | ICD-10-CM | POA: Diagnosis not present

## 2019-12-11 DIAGNOSIS — D631 Anemia in chronic kidney disease: Secondary | ICD-10-CM | POA: Diagnosis not present

## 2019-12-11 DIAGNOSIS — E1151 Type 2 diabetes mellitus with diabetic peripheral angiopathy without gangrene: Secondary | ICD-10-CM | POA: Diagnosis not present

## 2019-12-11 DIAGNOSIS — N1832 Chronic kidney disease, stage 3b: Secondary | ICD-10-CM | POA: Diagnosis not present

## 2019-12-11 DIAGNOSIS — S72141D Displaced intertrochanteric fracture of right femur, subsequent encounter for closed fracture with routine healing: Secondary | ICD-10-CM | POA: Diagnosis not present

## 2019-12-11 DIAGNOSIS — I2510 Atherosclerotic heart disease of native coronary artery without angina pectoris: Secondary | ICD-10-CM | POA: Diagnosis not present

## 2019-12-11 DIAGNOSIS — E1122 Type 2 diabetes mellitus with diabetic chronic kidney disease: Secondary | ICD-10-CM | POA: Diagnosis not present

## 2019-12-11 DIAGNOSIS — I350 Nonrheumatic aortic (valve) stenosis: Secondary | ICD-10-CM | POA: Diagnosis not present

## 2019-12-11 DIAGNOSIS — I482 Chronic atrial fibrillation, unspecified: Secondary | ICD-10-CM | POA: Diagnosis not present

## 2019-12-19 DIAGNOSIS — I2510 Atherosclerotic heart disease of native coronary artery without angina pectoris: Secondary | ICD-10-CM | POA: Diagnosis not present

## 2019-12-19 DIAGNOSIS — I350 Nonrheumatic aortic (valve) stenosis: Secondary | ICD-10-CM | POA: Diagnosis not present

## 2019-12-19 DIAGNOSIS — S72141D Displaced intertrochanteric fracture of right femur, subsequent encounter for closed fracture with routine healing: Secondary | ICD-10-CM | POA: Diagnosis not present

## 2019-12-19 DIAGNOSIS — E1122 Type 2 diabetes mellitus with diabetic chronic kidney disease: Secondary | ICD-10-CM | POA: Diagnosis not present

## 2019-12-19 DIAGNOSIS — E1151 Type 2 diabetes mellitus with diabetic peripheral angiopathy without gangrene: Secondary | ICD-10-CM | POA: Diagnosis not present

## 2019-12-19 DIAGNOSIS — I1310 Hypertensive heart and chronic kidney disease without heart failure, with stage 1 through stage 4 chronic kidney disease, or unspecified chronic kidney disease: Secondary | ICD-10-CM | POA: Diagnosis not present

## 2019-12-19 DIAGNOSIS — N1832 Chronic kidney disease, stage 3b: Secondary | ICD-10-CM | POA: Diagnosis not present

## 2019-12-19 DIAGNOSIS — D631 Anemia in chronic kidney disease: Secondary | ICD-10-CM | POA: Diagnosis not present

## 2019-12-19 DIAGNOSIS — I482 Chronic atrial fibrillation, unspecified: Secondary | ICD-10-CM | POA: Diagnosis not present

## 2019-12-25 DIAGNOSIS — D631 Anemia in chronic kidney disease: Secondary | ICD-10-CM | POA: Diagnosis not present

## 2019-12-25 DIAGNOSIS — I1310 Hypertensive heart and chronic kidney disease without heart failure, with stage 1 through stage 4 chronic kidney disease, or unspecified chronic kidney disease: Secondary | ICD-10-CM | POA: Diagnosis not present

## 2019-12-25 DIAGNOSIS — I482 Chronic atrial fibrillation, unspecified: Secondary | ICD-10-CM | POA: Diagnosis not present

## 2019-12-25 DIAGNOSIS — E1122 Type 2 diabetes mellitus with diabetic chronic kidney disease: Secondary | ICD-10-CM | POA: Diagnosis not present

## 2019-12-25 DIAGNOSIS — S72141D Displaced intertrochanteric fracture of right femur, subsequent encounter for closed fracture with routine healing: Secondary | ICD-10-CM | POA: Diagnosis not present

## 2019-12-25 DIAGNOSIS — N1832 Chronic kidney disease, stage 3b: Secondary | ICD-10-CM | POA: Diagnosis not present

## 2019-12-25 DIAGNOSIS — I350 Nonrheumatic aortic (valve) stenosis: Secondary | ICD-10-CM | POA: Diagnosis not present

## 2019-12-25 DIAGNOSIS — E1151 Type 2 diabetes mellitus with diabetic peripheral angiopathy without gangrene: Secondary | ICD-10-CM | POA: Diagnosis not present

## 2019-12-25 DIAGNOSIS — I2510 Atherosclerotic heart disease of native coronary artery without angina pectoris: Secondary | ICD-10-CM | POA: Diagnosis not present

## 2019-12-30 DIAGNOSIS — I482 Chronic atrial fibrillation, unspecified: Secondary | ICD-10-CM | POA: Diagnosis not present

## 2019-12-30 DIAGNOSIS — D631 Anemia in chronic kidney disease: Secondary | ICD-10-CM | POA: Diagnosis not present

## 2019-12-30 DIAGNOSIS — M6281 Muscle weakness (generalized): Secondary | ICD-10-CM | POA: Diagnosis not present

## 2019-12-30 DIAGNOSIS — S72141D Displaced intertrochanteric fracture of right femur, subsequent encounter for closed fracture with routine healing: Secondary | ICD-10-CM | POA: Diagnosis not present

## 2020-01-17 IMAGING — CR DG FEMUR 2+V*R*
5 series · 5 of 5 positions shown · non-contrast
Comparison: Pelvis images today

CLINICAL DATA: Fall, right hip pain

EXAM:
RIGHT FEMUR 2 VIEWS

[femur ap]
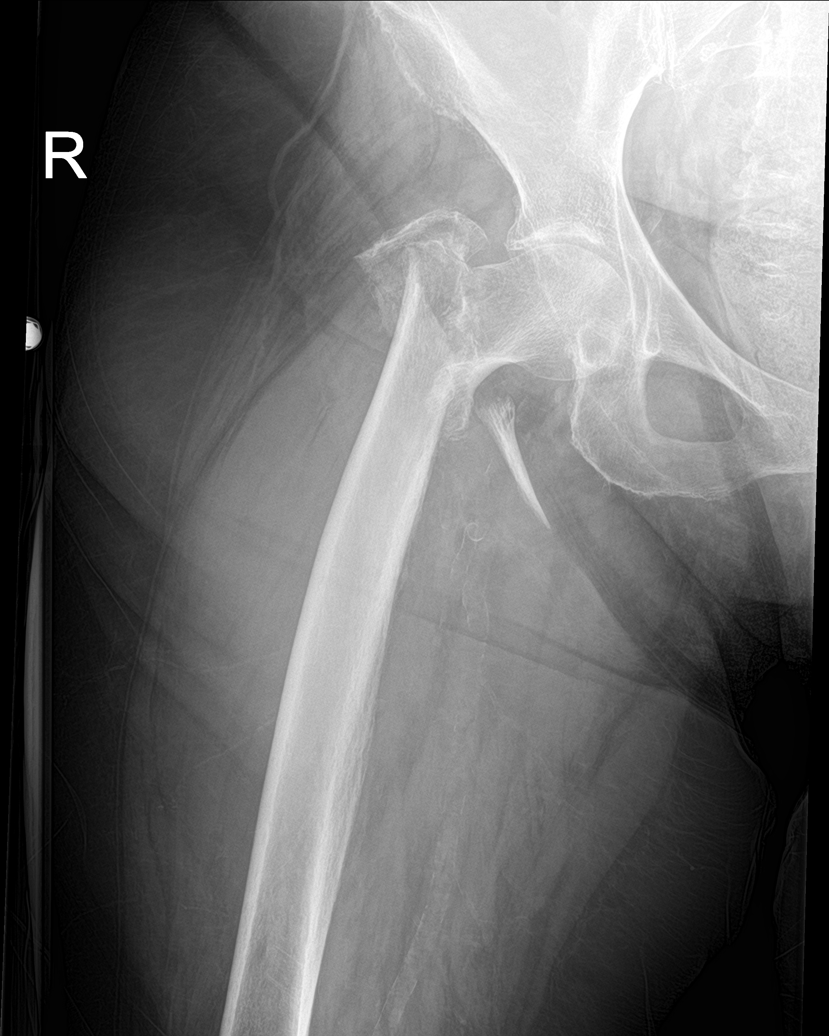

[femur lat (1 of 4)]
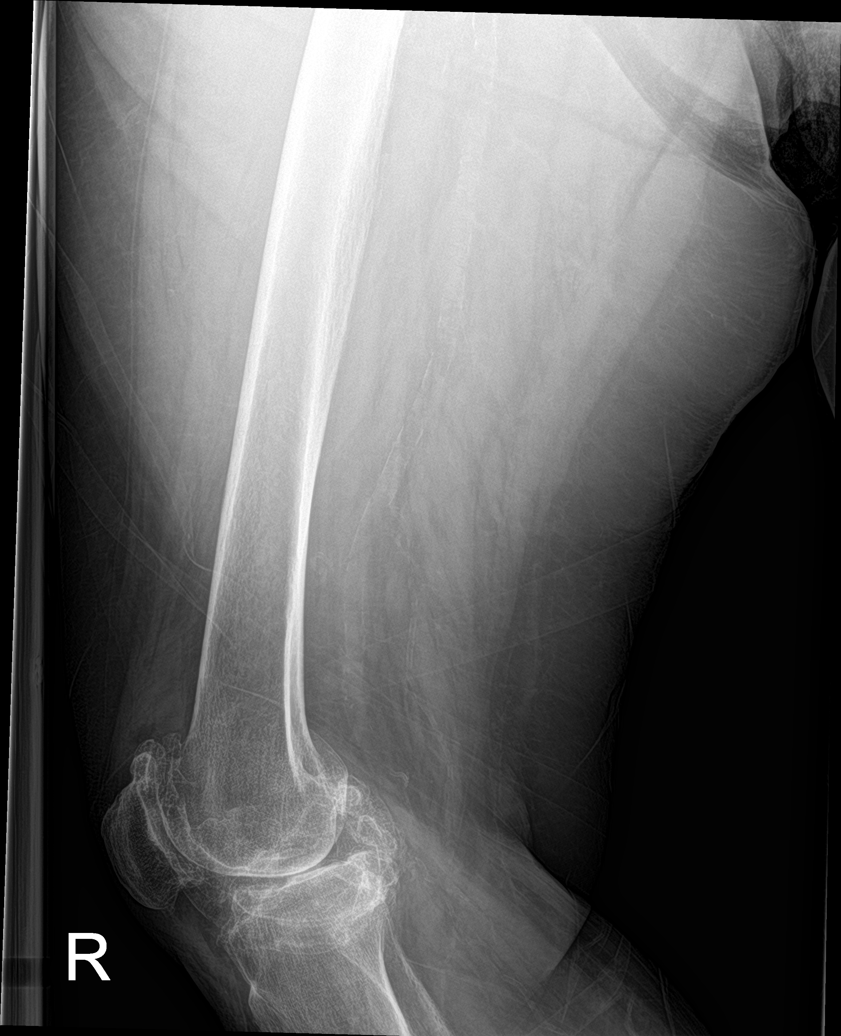

[femur lat (2 of 4)]
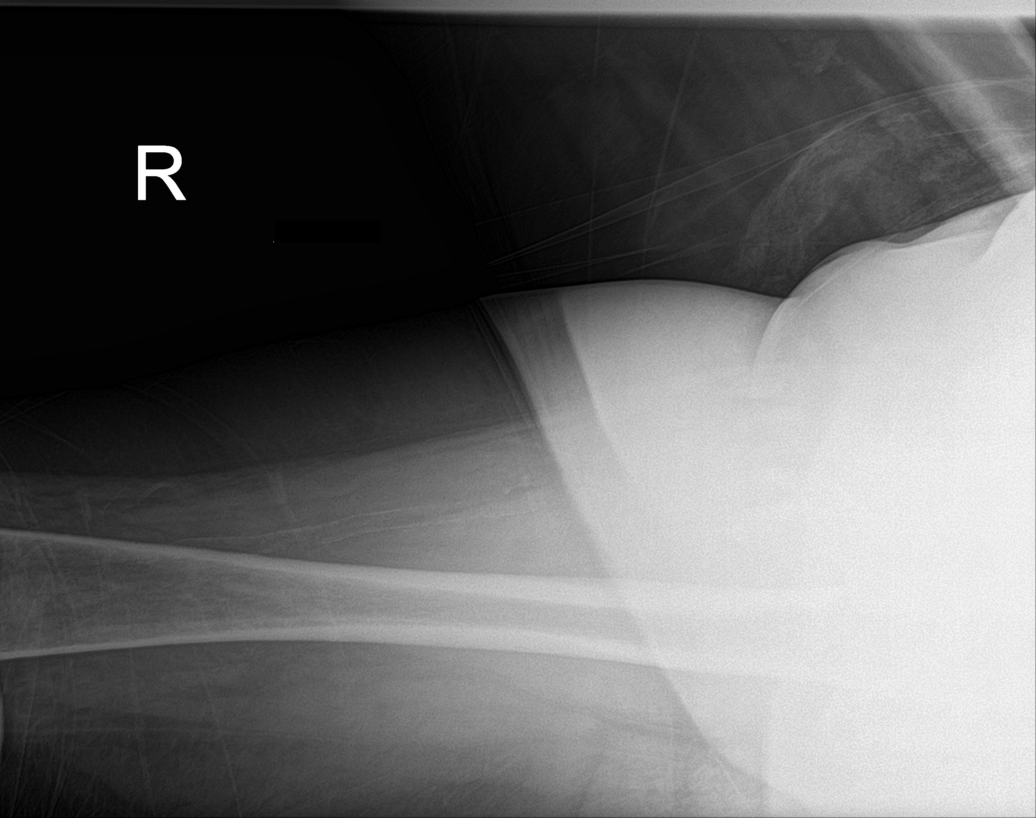

[femur lat (3 of 4)]
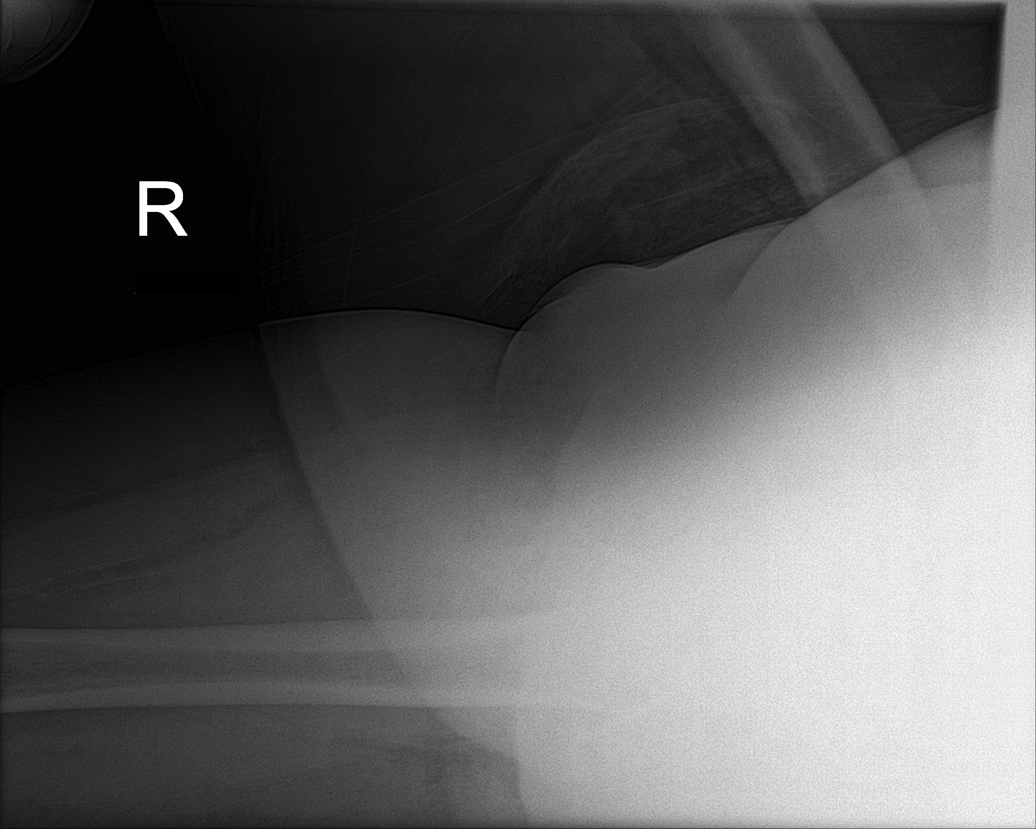

[femur lat (4 of 4)]
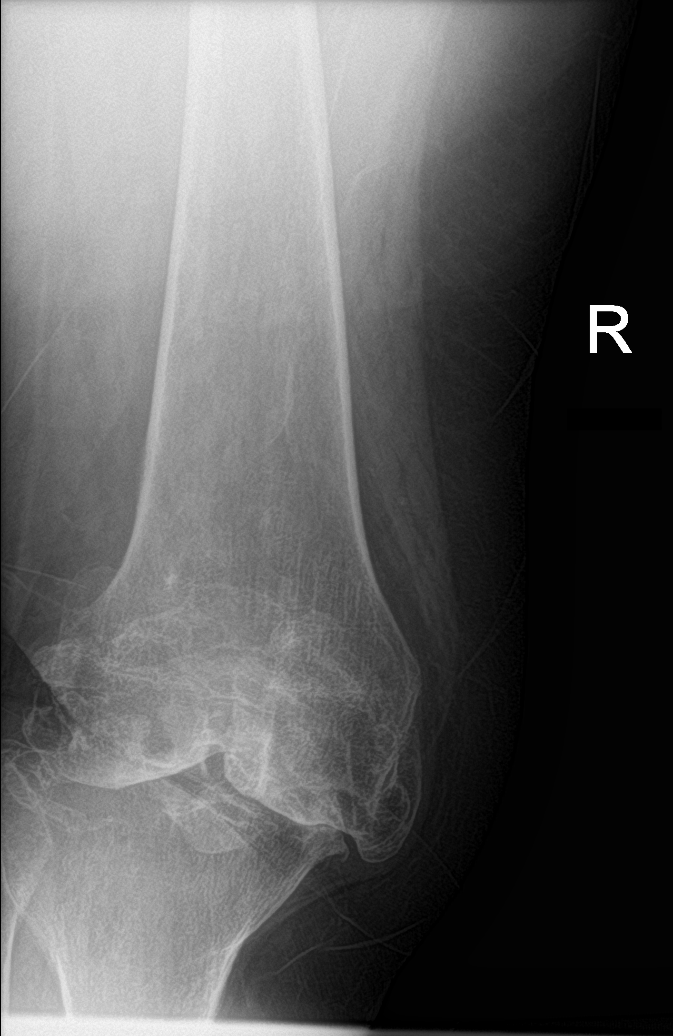

[5 of 5 positions shown; findings below may reference images not displayed]

FINDINGS: There is a comminuted right femoral intertrochanteric fracture with
moderately displaced fracture fragments and varus angulation. No
subluxation or dislocation. No additional acute bony abnormality.
Advanced degenerative changes in the right knee.
IMPRESSION: Comminuted right femoral intertrochanteric fracture with varus
angulation.

## 2020-01-21 IMAGING — DX DG PORTABLE PELVIS
1 series · 1 of 1 positions shown · non-contrast
Comparison: Portable exam 9368 hours compared to 08/15/2019

CLINICAL DATA: Post nailing of RIGHT femur

EXAM:
PORTABLE PELVIS 1-2 VIEWS

[pelvis ap]
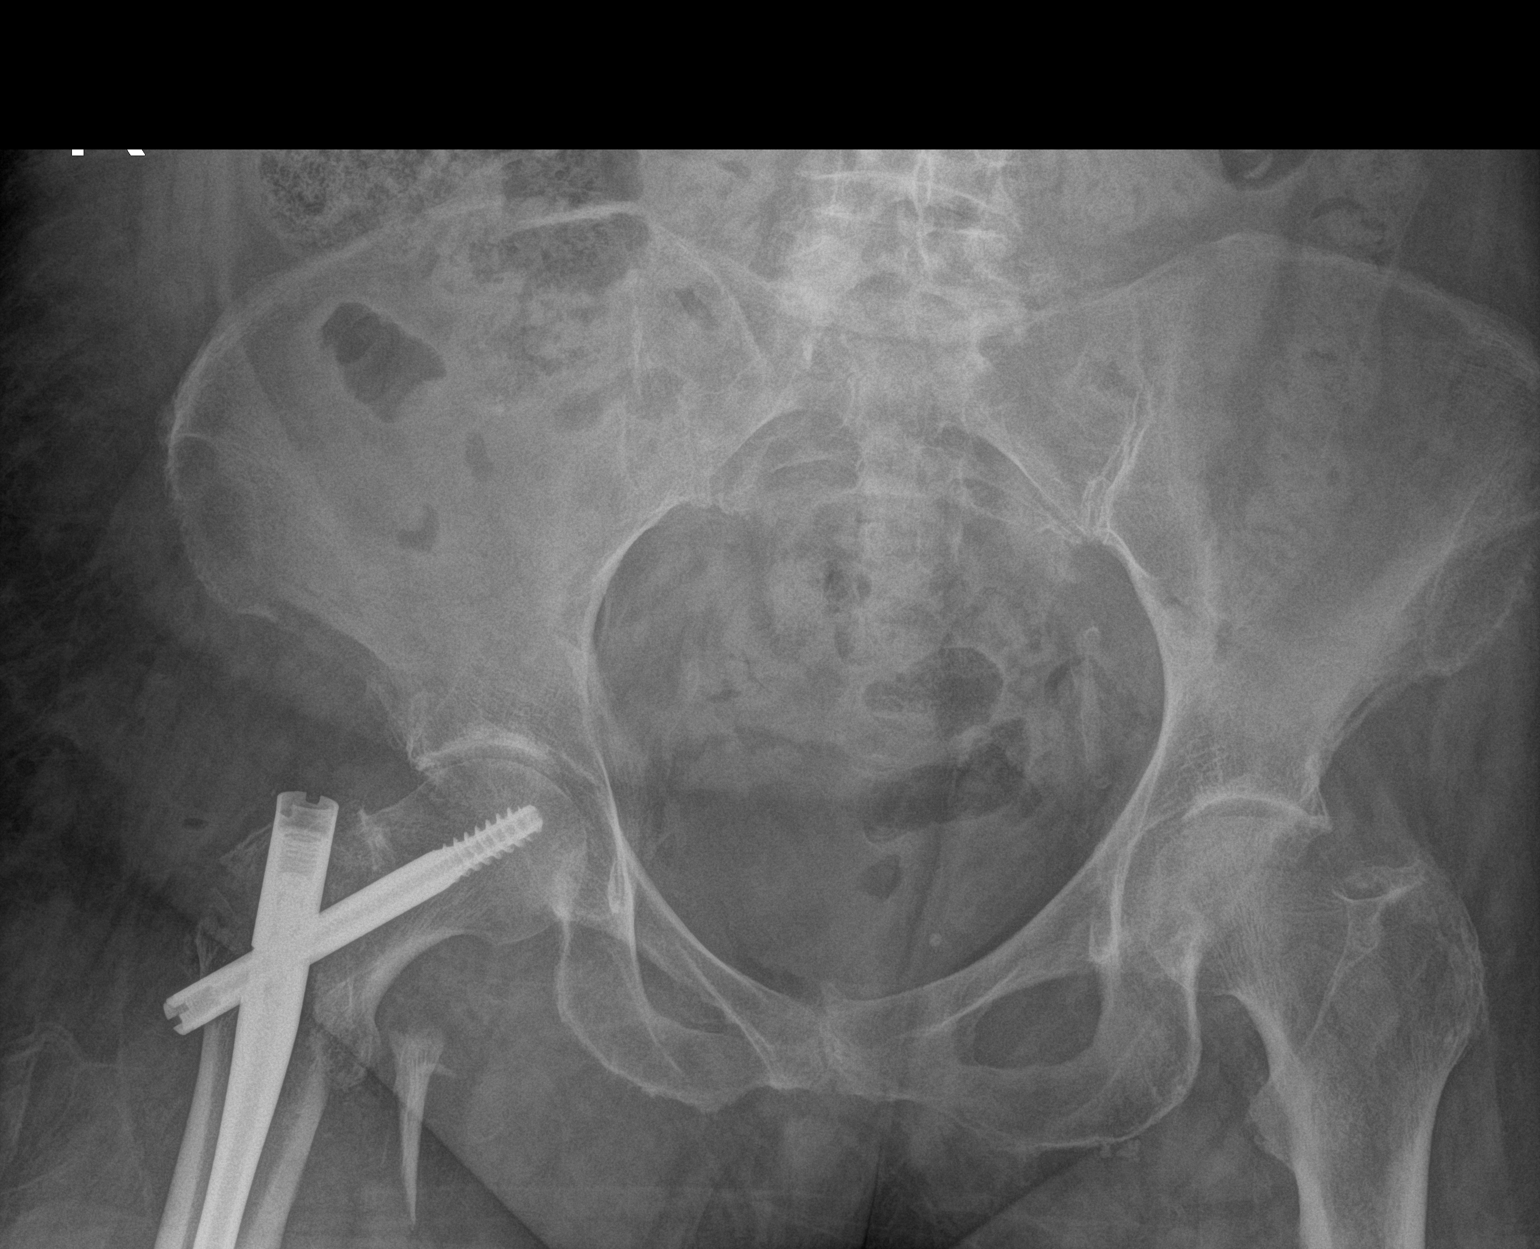

[1 of 1 positions shown; findings below may reference images not displayed]

FINDINGS: IM nail with proximal compression screw placed across a reduced
intertrochanteric fracture of the RIGHT femur.

Lesser trochanteric fracture fragment remains displaced.

Mild RIGHT hip joint space narrowing.

LEFT hip joint and SI joints preserved.

No pelvic fracture or bone destruction.
IMPRESSION: Post ORIF of intertrochanteric fracture RIGHT femur.

## 2020-01-21 IMAGING — RF DG C-ARM 1-60 MIN
1 series · 5 of 5 positions shown · non-contrast
Comparison: Right femur radiographs-08/15/2019

FLUOROSCOPY TIME:  2 minutes, 27 seconds

CLINICAL DATA: Intramedullary rod fixation of right femur fracture.

EXAM:
RIGHT FEMUR 2 VIEWS; DG C-ARM 1-60 MIN

[Series 1: unknown protocol · 0.20mm/px · 5 of 5 slices shown]
[im 1/5]
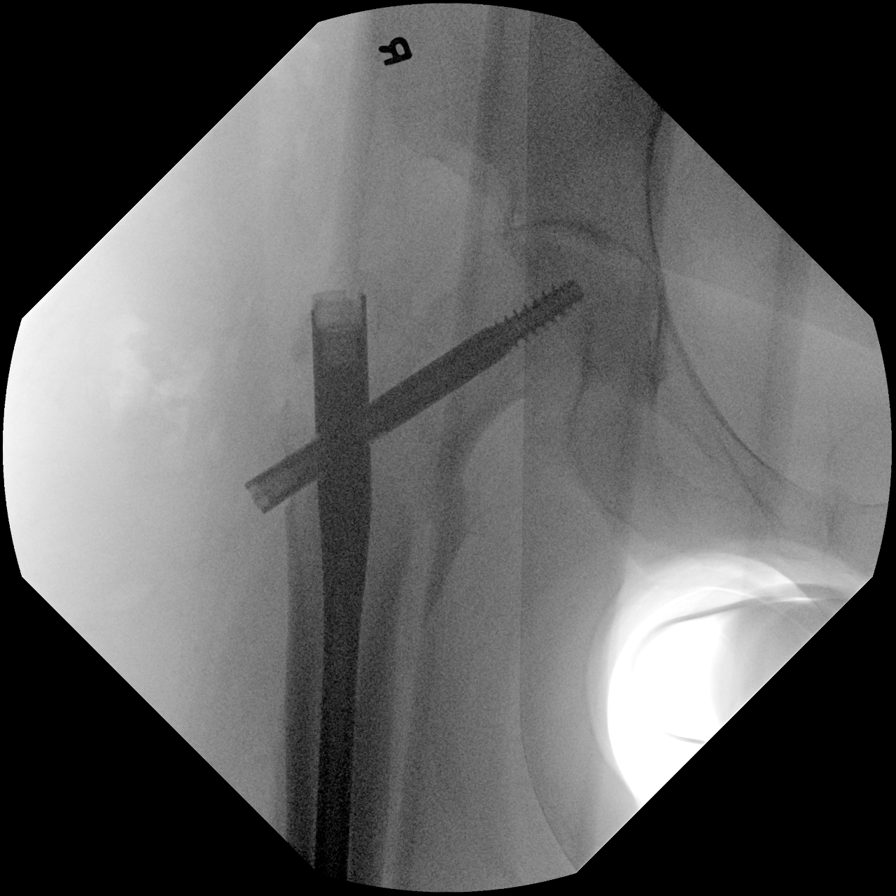
[im 2/5]
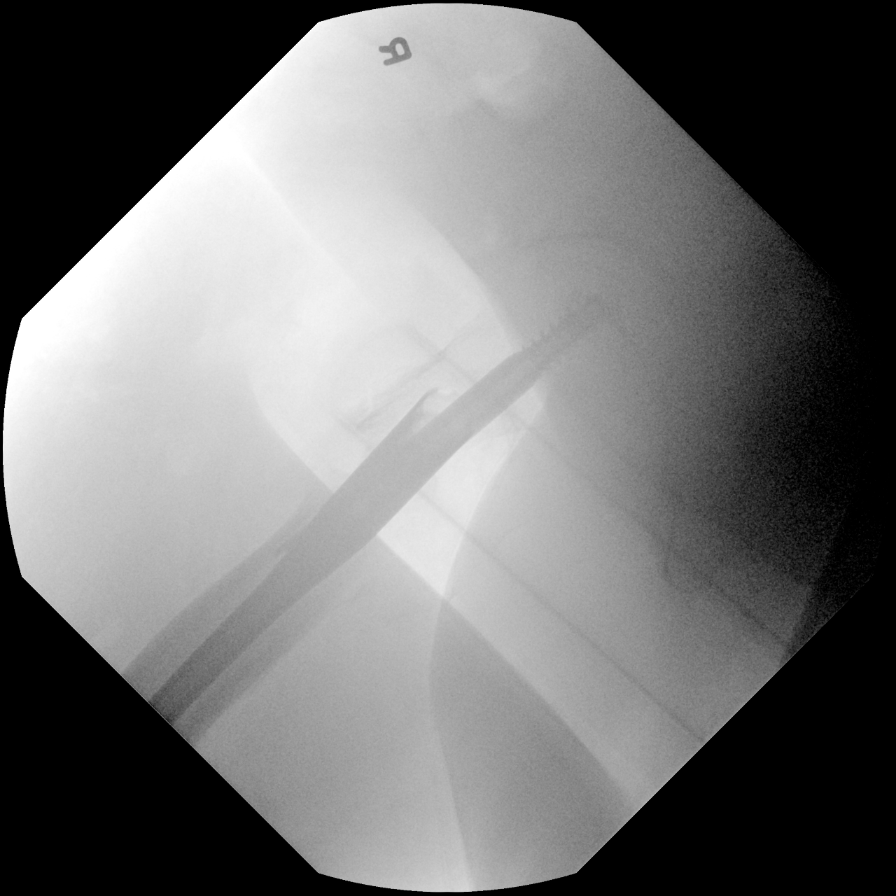
[im 3/5]
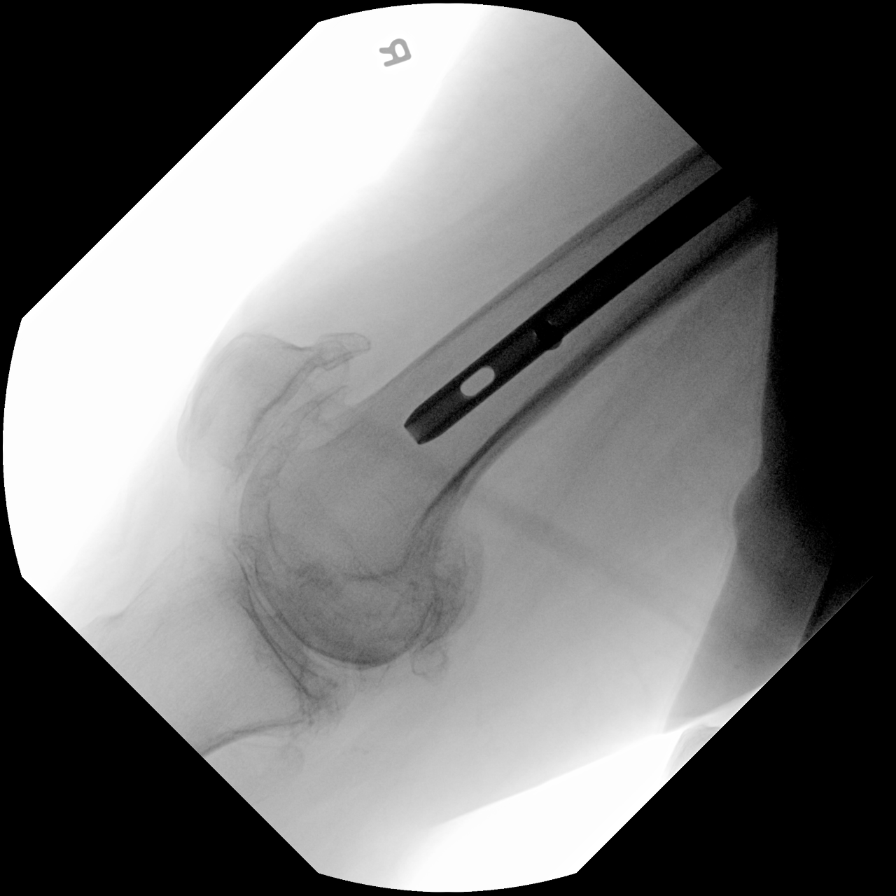
[im 4/5]
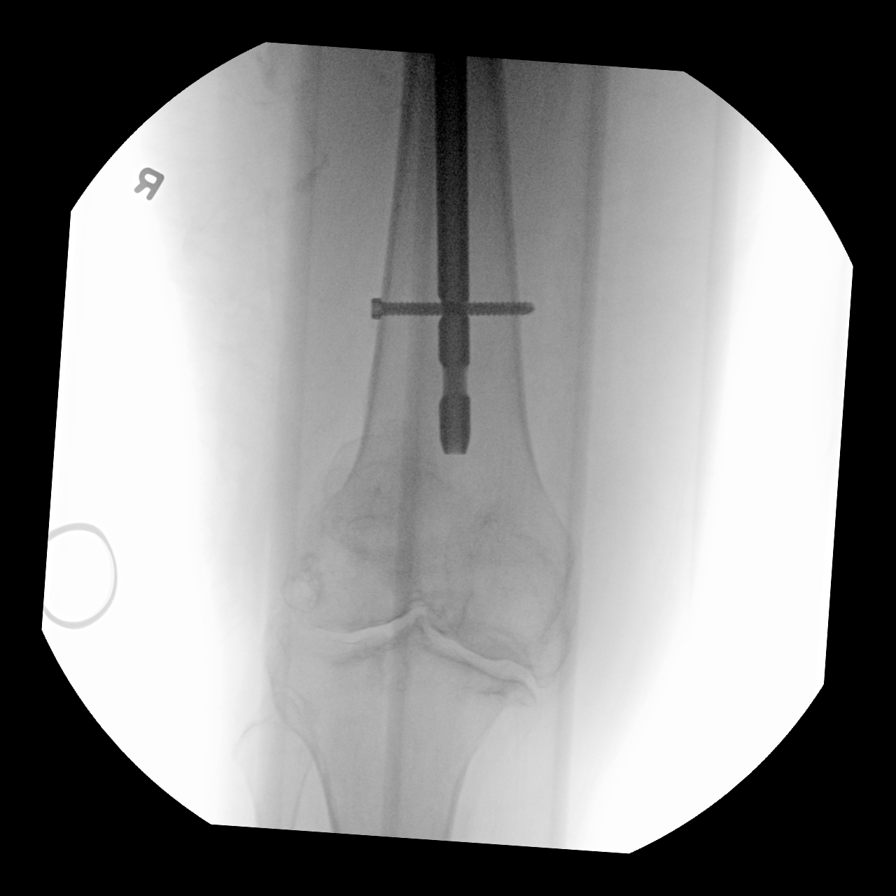
[im 5/5]
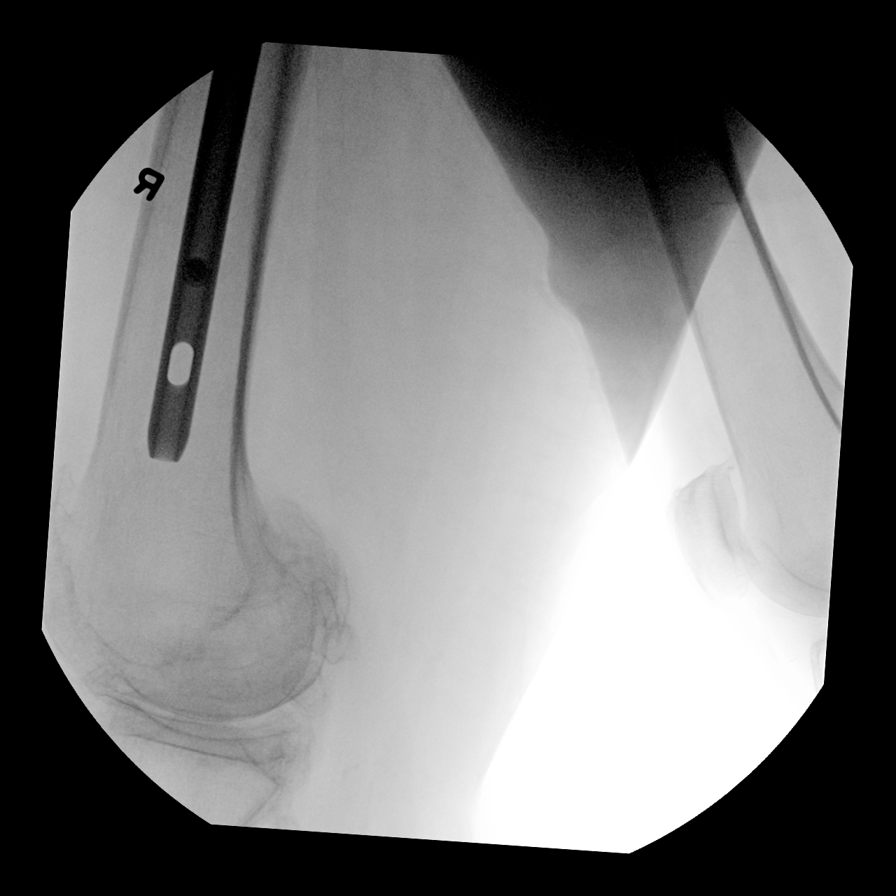

[5 of 5 positions shown; findings below may reference images not displayed]

FINDINGS: Five spot intraoperative fluoroscopic images of the right femur are
provided for review.

Images demonstrate the sequela of intramedullary rod fixation of the
right femur and dynamic screw fixation of the right femoral neck
transfixing known comminuted intertrochanteric femur fracture. The
distal end of the intramedullary rod is transfixed with a single
cancellous screw. Alignment appears near anatomic.

There is a minimal amount of subcutaneous emphysema about the
operative site. No radiopaque foreign body.

Incidentally noted moderate to severe degenerative change involving
the medial and lateral compartments of the right knee.
IMPRESSION: Post intramedullary rod and dynamic screw fixation of the right
femur and femoral neck without evidence of complication.

## 2020-01-29 DIAGNOSIS — I482 Chronic atrial fibrillation, unspecified: Secondary | ICD-10-CM | POA: Diagnosis not present

## 2020-01-29 DIAGNOSIS — S72141D Displaced intertrochanteric fracture of right femur, subsequent encounter for closed fracture with routine healing: Secondary | ICD-10-CM | POA: Diagnosis not present

## 2020-01-29 DIAGNOSIS — M6281 Muscle weakness (generalized): Secondary | ICD-10-CM | POA: Diagnosis not present

## 2020-01-29 DIAGNOSIS — D631 Anemia in chronic kidney disease: Secondary | ICD-10-CM | POA: Diagnosis not present

## 2020-02-29 DIAGNOSIS — I482 Chronic atrial fibrillation, unspecified: Secondary | ICD-10-CM | POA: Diagnosis not present

## 2020-02-29 DIAGNOSIS — S72141D Displaced intertrochanteric fracture of right femur, subsequent encounter for closed fracture with routine healing: Secondary | ICD-10-CM | POA: Diagnosis not present

## 2020-02-29 DIAGNOSIS — D631 Anemia in chronic kidney disease: Secondary | ICD-10-CM | POA: Diagnosis not present

## 2020-02-29 DIAGNOSIS — M6281 Muscle weakness (generalized): Secondary | ICD-10-CM | POA: Diagnosis not present

## 2020-03-30 DIAGNOSIS — M6281 Muscle weakness (generalized): Secondary | ICD-10-CM | POA: Diagnosis not present

## 2020-03-30 DIAGNOSIS — I482 Chronic atrial fibrillation, unspecified: Secondary | ICD-10-CM | POA: Diagnosis not present

## 2020-03-30 DIAGNOSIS — D631 Anemia in chronic kidney disease: Secondary | ICD-10-CM | POA: Diagnosis not present

## 2020-03-30 DIAGNOSIS — S72141D Displaced intertrochanteric fracture of right femur, subsequent encounter for closed fracture with routine healing: Secondary | ICD-10-CM | POA: Diagnosis not present

## 2020-04-03 DIAGNOSIS — I480 Paroxysmal atrial fibrillation: Secondary | ICD-10-CM | POA: Diagnosis not present

## 2020-04-03 DIAGNOSIS — E1140 Type 2 diabetes mellitus with diabetic neuropathy, unspecified: Secondary | ICD-10-CM | POA: Diagnosis not present

## 2020-04-03 DIAGNOSIS — I10 Essential (primary) hypertension: Secondary | ICD-10-CM | POA: Diagnosis not present

## 2020-04-03 DIAGNOSIS — E871 Hypo-osmolality and hyponatremia: Secondary | ICD-10-CM | POA: Diagnosis not present

## 2020-04-03 DIAGNOSIS — E559 Vitamin D deficiency, unspecified: Secondary | ICD-10-CM | POA: Diagnosis not present

## 2020-04-03 DIAGNOSIS — R269 Unspecified abnormalities of gait and mobility: Secondary | ICD-10-CM | POA: Diagnosis not present

## 2020-04-03 DIAGNOSIS — E785 Hyperlipidemia, unspecified: Secondary | ICD-10-CM | POA: Diagnosis not present

## 2020-04-03 DIAGNOSIS — I2510 Atherosclerotic heart disease of native coronary artery without angina pectoris: Secondary | ICD-10-CM | POA: Diagnosis not present

## 2020-04-03 DIAGNOSIS — F0390 Unspecified dementia without behavioral disturbance: Secondary | ICD-10-CM | POA: Diagnosis not present

## 2020-04-03 DIAGNOSIS — D649 Anemia, unspecified: Secondary | ICD-10-CM | POA: Diagnosis not present

## 2020-04-30 DIAGNOSIS — I482 Chronic atrial fibrillation, unspecified: Secondary | ICD-10-CM | POA: Diagnosis not present

## 2020-04-30 DIAGNOSIS — D631 Anemia in chronic kidney disease: Secondary | ICD-10-CM | POA: Diagnosis not present

## 2020-04-30 DIAGNOSIS — S72141D Displaced intertrochanteric fracture of right femur, subsequent encounter for closed fracture with routine healing: Secondary | ICD-10-CM | POA: Diagnosis not present

## 2020-04-30 DIAGNOSIS — M6281 Muscle weakness (generalized): Secondary | ICD-10-CM | POA: Diagnosis not present

## 2020-05-31 DIAGNOSIS — S72141D Displaced intertrochanteric fracture of right femur, subsequent encounter for closed fracture with routine healing: Secondary | ICD-10-CM | POA: Diagnosis not present

## 2020-05-31 DIAGNOSIS — D631 Anemia in chronic kidney disease: Secondary | ICD-10-CM | POA: Diagnosis not present

## 2020-05-31 DIAGNOSIS — I482 Chronic atrial fibrillation, unspecified: Secondary | ICD-10-CM | POA: Diagnosis not present

## 2020-05-31 DIAGNOSIS — M6281 Muscle weakness (generalized): Secondary | ICD-10-CM | POA: Diagnosis not present

## 2020-06-30 DIAGNOSIS — M6281 Muscle weakness (generalized): Secondary | ICD-10-CM | POA: Diagnosis not present

## 2020-06-30 DIAGNOSIS — S72141D Displaced intertrochanteric fracture of right femur, subsequent encounter for closed fracture with routine healing: Secondary | ICD-10-CM | POA: Diagnosis not present

## 2020-06-30 DIAGNOSIS — D631 Anemia in chronic kidney disease: Secondary | ICD-10-CM | POA: Diagnosis not present

## 2020-06-30 DIAGNOSIS — I482 Chronic atrial fibrillation, unspecified: Secondary | ICD-10-CM | POA: Diagnosis not present

## 2020-07-31 DIAGNOSIS — D631 Anemia in chronic kidney disease: Secondary | ICD-10-CM | POA: Diagnosis not present

## 2020-07-31 DIAGNOSIS — S72141D Displaced intertrochanteric fracture of right femur, subsequent encounter for closed fracture with routine healing: Secondary | ICD-10-CM | POA: Diagnosis not present

## 2020-07-31 DIAGNOSIS — M6281 Muscle weakness (generalized): Secondary | ICD-10-CM | POA: Diagnosis not present

## 2020-07-31 DIAGNOSIS — I482 Chronic atrial fibrillation, unspecified: Secondary | ICD-10-CM | POA: Diagnosis not present

## 2020-08-01 DIAGNOSIS — I2510 Atherosclerotic heart disease of native coronary artery without angina pectoris: Secondary | ICD-10-CM | POA: Diagnosis not present

## 2020-08-01 DIAGNOSIS — I639 Cerebral infarction, unspecified: Secondary | ICD-10-CM | POA: Diagnosis not present

## 2020-08-01 DIAGNOSIS — F329 Major depressive disorder, single episode, unspecified: Secondary | ICD-10-CM | POA: Diagnosis not present

## 2020-08-01 DIAGNOSIS — I10 Essential (primary) hypertension: Secondary | ICD-10-CM | POA: Diagnosis not present

## 2020-08-01 DIAGNOSIS — I480 Paroxysmal atrial fibrillation: Secondary | ICD-10-CM | POA: Diagnosis not present

## 2020-08-01 DIAGNOSIS — E1142 Type 2 diabetes mellitus with diabetic polyneuropathy: Secondary | ICD-10-CM | POA: Diagnosis not present

## 2020-08-01 DIAGNOSIS — E559 Vitamin D deficiency, unspecified: Secondary | ICD-10-CM | POA: Diagnosis not present

## 2020-08-01 DIAGNOSIS — F0150 Vascular dementia without behavioral disturbance: Secondary | ICD-10-CM | POA: Diagnosis not present

## 2020-08-01 DIAGNOSIS — E785 Hyperlipidemia, unspecified: Secondary | ICD-10-CM | POA: Diagnosis not present

## 2020-08-08 ENCOUNTER — Other Ambulatory Visit: Payer: Self-pay

## 2020-08-08 ENCOUNTER — Encounter (HOSPITAL_COMMUNITY): Payer: Self-pay | Admitting: Emergency Medicine

## 2020-08-08 ENCOUNTER — Emergency Department (HOSPITAL_COMMUNITY): Payer: Medicare HMO

## 2020-08-08 ENCOUNTER — Inpatient Hospital Stay (HOSPITAL_COMMUNITY)
Admission: EM | Admit: 2020-08-08 | Discharge: 2020-08-11 | DRG: 291 | Disposition: A | Discharge status: Home Health | Payer: Medicare HMO | Attending: Internal Medicine | Admitting: Internal Medicine

## 2020-08-08 DIAGNOSIS — I350 Nonrheumatic aortic (valve) stenosis: Secondary | ICD-10-CM | POA: Diagnosis present

## 2020-08-08 DIAGNOSIS — I110 Hypertensive heart disease with heart failure: Secondary | ICD-10-CM | POA: Diagnosis not present

## 2020-08-08 DIAGNOSIS — Z6834 Body mass index (BMI) 34.0-34.9, adult: Secondary | ICD-10-CM

## 2020-08-08 DIAGNOSIS — E669 Obesity, unspecified: Secondary | ICD-10-CM | POA: Diagnosis present

## 2020-08-08 DIAGNOSIS — R0689 Other abnormalities of breathing: Secondary | ICD-10-CM | POA: Diagnosis not present

## 2020-08-08 DIAGNOSIS — J181 Lobar pneumonia, unspecified organism: Secondary | ICD-10-CM | POA: Diagnosis present

## 2020-08-08 DIAGNOSIS — E782 Mixed hyperlipidemia: Secondary | ICD-10-CM | POA: Diagnosis not present

## 2020-08-08 DIAGNOSIS — J9601 Acute respiratory failure with hypoxia: Secondary | ICD-10-CM | POA: Diagnosis not present

## 2020-08-08 DIAGNOSIS — Z823 Family history of stroke: Secondary | ICD-10-CM

## 2020-08-08 DIAGNOSIS — N390 Urinary tract infection, site not specified: Secondary | ICD-10-CM | POA: Diagnosis not present

## 2020-08-08 DIAGNOSIS — E1122 Type 2 diabetes mellitus with diabetic chronic kidney disease: Secondary | ICD-10-CM | POA: Diagnosis present

## 2020-08-08 DIAGNOSIS — I10 Essential (primary) hypertension: Secondary | ICD-10-CM | POA: Diagnosis not present

## 2020-08-08 DIAGNOSIS — E785 Hyperlipidemia, unspecified: Secondary | ICD-10-CM | POA: Diagnosis present

## 2020-08-08 DIAGNOSIS — N1832 Chronic kidney disease, stage 3b: Secondary | ICD-10-CM | POA: Diagnosis not present

## 2020-08-08 DIAGNOSIS — I130 Hypertensive heart and chronic kidney disease with heart failure and stage 1 through stage 4 chronic kidney disease, or unspecified chronic kidney disease: Principal | ICD-10-CM | POA: Diagnosis present

## 2020-08-08 DIAGNOSIS — T380X5A Adverse effect of glucocorticoids and synthetic analogues, initial encounter: Secondary | ICD-10-CM | POA: Diagnosis not present

## 2020-08-08 DIAGNOSIS — R001 Bradycardia, unspecified: Secondary | ICD-10-CM | POA: Diagnosis present

## 2020-08-08 DIAGNOSIS — Z85828 Personal history of other malignant neoplasm of skin: Secondary | ICD-10-CM

## 2020-08-08 DIAGNOSIS — Z79899 Other long term (current) drug therapy: Secondary | ICD-10-CM | POA: Diagnosis not present

## 2020-08-08 DIAGNOSIS — Z923 Personal history of irradiation: Secondary | ICD-10-CM

## 2020-08-08 DIAGNOSIS — E7800 Pure hypercholesterolemia, unspecified: Secondary | ICD-10-CM | POA: Diagnosis present

## 2020-08-08 DIAGNOSIS — Z20822 Contact with and (suspected) exposure to covid-19: Secondary | ICD-10-CM | POA: Diagnosis not present

## 2020-08-08 DIAGNOSIS — G4733 Obstructive sleep apnea (adult) (pediatric): Secondary | ICD-10-CM | POA: Diagnosis present

## 2020-08-08 DIAGNOSIS — Z8249 Family history of ischemic heart disease and other diseases of the circulatory system: Secondary | ICD-10-CM

## 2020-08-08 DIAGNOSIS — J189 Pneumonia, unspecified organism: Secondary | ICD-10-CM | POA: Diagnosis not present

## 2020-08-08 DIAGNOSIS — I482 Chronic atrial fibrillation, unspecified: Secondary | ICD-10-CM | POA: Diagnosis not present

## 2020-08-08 DIAGNOSIS — Z794 Long term (current) use of insulin: Secondary | ICD-10-CM | POA: Diagnosis not present

## 2020-08-08 DIAGNOSIS — Y92239 Unspecified place in hospital as the place of occurrence of the external cause: Secondary | ICD-10-CM | POA: Diagnosis not present

## 2020-08-08 DIAGNOSIS — E1140 Type 2 diabetes mellitus with diabetic neuropathy, unspecified: Secondary | ICD-10-CM | POA: Diagnosis present

## 2020-08-08 DIAGNOSIS — J9801 Acute bronchospasm: Secondary | ICD-10-CM | POA: Diagnosis present

## 2020-08-08 DIAGNOSIS — F0390 Unspecified dementia without behavioral disturbance: Secondary | ICD-10-CM | POA: Diagnosis present

## 2020-08-08 DIAGNOSIS — Z7901 Long term (current) use of anticoagulants: Secondary | ICD-10-CM

## 2020-08-08 DIAGNOSIS — J811 Chronic pulmonary edema: Secondary | ICD-10-CM

## 2020-08-08 DIAGNOSIS — I509 Heart failure, unspecified: Secondary | ICD-10-CM | POA: Diagnosis not present

## 2020-08-08 DIAGNOSIS — B9620 Unspecified Escherichia coli [E. coli] as the cause of diseases classified elsewhere: Secondary | ICD-10-CM | POA: Diagnosis present

## 2020-08-08 DIAGNOSIS — R0902 Hypoxemia: Secondary | ICD-10-CM

## 2020-08-08 DIAGNOSIS — I5031 Acute diastolic (congestive) heart failure: Secondary | ICD-10-CM | POA: Diagnosis present

## 2020-08-08 DIAGNOSIS — Z833 Family history of diabetes mellitus: Secondary | ICD-10-CM | POA: Diagnosis not present

## 2020-08-08 DIAGNOSIS — R0602 Shortness of breath: Secondary | ICD-10-CM | POA: Diagnosis not present

## 2020-08-08 DIAGNOSIS — Z955 Presence of coronary angioplasty implant and graft: Secondary | ICD-10-CM

## 2020-08-08 DIAGNOSIS — J80 Acute respiratory distress syndrome: Secondary | ICD-10-CM | POA: Diagnosis not present

## 2020-08-08 LAB — CBC WITH DIFFERENTIAL/PLATELET
Abs Immature Granulocytes: 0.09 10*3/uL — ABNORMAL HIGH (ref 0.00–0.07)
Basophils Absolute: 0.1 10*3/uL (ref 0.0–0.1)
Basophils Relative: 1 %
Eosinophils Absolute: 0.5 10*3/uL (ref 0.0–0.5)
Eosinophils Relative: 3 %
HCT: 37.1 % (ref 36.0–46.0)
Hemoglobin: 11 g/dL — ABNORMAL LOW (ref 12.0–15.0)
Immature Granulocytes: 1 %
Lymphocytes Relative: 8 %
Lymphs Abs: 1.3 10*3/uL (ref 0.7–4.0)
MCH: 28.9 pg (ref 26.0–34.0)
MCHC: 29.6 g/dL — ABNORMAL LOW (ref 30.0–36.0)
MCV: 97.4 fL (ref 80.0–100.0)
Monocytes Absolute: 1.1 10*3/uL — ABNORMAL HIGH (ref 0.1–1.0)
Monocytes Relative: 7 %
Neutro Abs: 14 10*3/uL — ABNORMAL HIGH (ref 1.7–7.7)
Neutrophils Relative %: 80 %
Platelets: 249 10*3/uL (ref 150–400)
RBC: 3.81 MIL/uL — ABNORMAL LOW (ref 3.87–5.11)
RDW: 14.4 % (ref 11.5–15.5)
WBC: 17.2 10*3/uL — ABNORMAL HIGH (ref 4.0–10.5)
nRBC: 0 % (ref 0.0–0.2)

## 2020-08-08 LAB — PROCALCITONIN: Procalcitonin: <0.1 ng/mL

## 2020-08-08 LAB — TROPONIN I (HIGH SENSITIVITY)
Troponin I (High Sensitivity): 9 ng/L (ref <18)
Troponin I (High Sensitivity): 18 ng/L — ABNORMAL HIGH (ref <18)

## 2020-08-08 LAB — FERRITIN: Ferritin: 135 ng/mL (ref 11–307)

## 2020-08-08 LAB — C-REACTIVE PROTEIN: CRP: 1.5 mg/dL — ABNORMAL HIGH (ref <1.0)

## 2020-08-08 LAB — COMPREHENSIVE METABOLIC PANEL
ALT: 13 U/L (ref 0–44)
AST: 18 U/L (ref 15–41)
Albumin: 3.2 g/dL — ABNORMAL LOW (ref 3.5–5.0)
Alkaline Phosphatase: 122 U/L (ref 38–126)
Anion gap: 9 (ref 5–15)
BUN: 32 mg/dL — ABNORMAL HIGH (ref 8–23)
CO2: 24 mmol/L (ref 22–32)
Calcium: 8.6 mg/dL — ABNORMAL LOW (ref 8.9–10.3)
Chloride: 105 mmol/L (ref 98–111)
Creatinine, Ser: 1.22 mg/dL — ABNORMAL HIGH (ref 0.44–1.00)
GFR, Estimated: 47 mL/min — ABNORMAL LOW (ref >60)
Glucose, Bld: 255 mg/dL — ABNORMAL HIGH (ref 70–99)
Potassium: 4.5 mmol/L (ref 3.5–5.1)
Sodium: 138 mmol/L (ref 135–145)
Total Bilirubin: 0.5 mg/dL (ref 0.3–1.2)
Total Protein: 7.3 g/dL (ref 6.5–8.1)

## 2020-08-08 LAB — FIBRINOGEN: Fibrinogen: 601 mg/dL — ABNORMAL HIGH (ref 210–475)

## 2020-08-08 LAB — LACTATE DEHYDROGENASE: LDH: 173 U/L (ref 98–192)

## 2020-08-08 LAB — D-DIMER, QUANTITATIVE: D-Dimer, Quant: 1.09 ug/mL-FEU — ABNORMAL HIGH (ref 0.00–0.50)

## 2020-08-08 LAB — TRIGLYCERIDES: Triglycerides: 115 mg/dL (ref <150)

## 2020-08-08 LAB — BRAIN NATRIURETIC PEPTIDE: B Natriuretic Peptide: 418 pg/mL — ABNORMAL HIGH (ref 0.0–100.0)

## 2020-08-08 LAB — RESP PANEL BY RT-PCR (FLU A&B, COVID) ARPGX2
Influenza A by PCR: NEGATIVE
Influenza B by PCR: NEGATIVE
SARS Coronavirus 2 by RT PCR: NEGATIVE

## 2020-08-08 LAB — LACTIC ACID, PLASMA
Lactic Acid, Venous: 1.8 mmol/L (ref 0.5–1.9)
Lactic Acid, Venous: 1.7 mmol/L (ref 0.5–1.9)

## 2020-08-08 MED ORDER — MEMANTINE HCL 10 MG PO TABS
5.0000 mg | ORAL_TABLET | Freq: Two times a day (BID) | ORAL | Status: DC
  Administered 2020-08-08 – 2020-08-11 (×6): 5 mg via ORAL
  Filled 2020-08-08 (×6): qty 1

## 2020-08-08 MED ORDER — VITAMIN D 25 MCG (1000 UNIT) PO TABS
2000.0000 [IU] | ORAL_TABLET | Freq: Every day | ORAL | Status: DC
  Administered 2020-08-09 – 2020-08-11 (×3): 2000 [IU] via ORAL
  Filled 2020-08-08 (×3): qty 2

## 2020-08-08 MED ORDER — FUROSEMIDE 10 MG/ML IJ SOLN
INTRAMUSCULAR | Status: AC
  Filled 2020-08-08: qty 4

## 2020-08-08 MED ORDER — SODIUM CHLORIDE 0.9 % IV SOLN: 250.0000 mL | INTRAVENOUS | Status: DC | PRN

## 2020-08-08 MED ORDER — SODIUM CHLORIDE 0.9% FLUSH
3.0000 mL | Freq: Two times a day (BID) | INTRAVENOUS | Status: DC
  Administered 2020-08-08 – 2020-08-11 (×5): 3 mL via INTRAVENOUS

## 2020-08-08 MED ORDER — ESCITALOPRAM OXALATE 10 MG PO TABS
20.0000 mg | ORAL_TABLET | Freq: Every day | ORAL | Status: DC
  Administered 2020-08-09 – 2020-08-11 (×3): 20 mg via ORAL
  Filled 2020-08-08 (×3): qty 2

## 2020-08-08 MED ORDER — AEROCHAMBER Z-STAT PLUS/MEDIUM MISC
1.0000 | Freq: Once | Status: AC
  Administered 2020-08-08: 1

## 2020-08-08 MED ORDER — ATORVASTATIN CALCIUM 40 MG PO TABS
80.0000 mg | ORAL_TABLET | Freq: Every day | ORAL | Status: DC
  Administered 2020-08-09 – 2020-08-10 (×2): 80 mg via ORAL
  Filled 2020-08-08 (×3): qty 2

## 2020-08-08 MED ORDER — METHYLPREDNISOLONE SODIUM SUCC 125 MG IJ SOLR
125.0000 mg | Freq: Once | INTRAMUSCULAR | Status: AC
  Administered 2020-08-08: 125 mg via INTRAVENOUS
  Filled 2020-08-08: qty 2

## 2020-08-08 MED ORDER — INSULIN GLARGINE 100 UNIT/ML ~~LOC~~ SOLN
30.0000 [IU] | Freq: Every day | SUBCUTANEOUS | Status: DC
  Administered 2020-08-09: 30 [IU] via SUBCUTANEOUS
  Filled 2020-08-08 (×4): qty 0.3

## 2020-08-08 MED ORDER — SODIUM CHLORIDE 0.9% FLUSH: 3.0000 mL | INTRAVENOUS | Status: DC | PRN

## 2020-08-08 MED ORDER — ORAL CARE MOUTH RINSE
15.0000 mL | Freq: Two times a day (BID) | OROMUCOSAL | Status: DC
  Administered 2020-08-08 – 2020-08-11 (×7): 15 mL via OROMUCOSAL

## 2020-08-08 MED ORDER — ONDANSETRON HCL 4 MG/2ML IJ SOLN: 4.0000 mg | Freq: Four times a day (QID) | INTRAMUSCULAR | Status: DC | PRN

## 2020-08-08 MED ORDER — INSULIN ASPART 100 UNIT/ML ~~LOC~~ SOLN
0.0000 [IU] | Freq: Every day | SUBCUTANEOUS | Status: DC
  Administered 2020-08-08: 3 [IU] via SUBCUTANEOUS

## 2020-08-08 MED ORDER — METOPROLOL SUCCINATE ER 50 MG PO TB24
100.0000 mg | ORAL_TABLET | Freq: Every day | ORAL | Status: DC
  Administered 2020-08-10: 100 mg via ORAL
  Filled 2020-08-08 (×2): qty 2

## 2020-08-08 MED ORDER — ACETAMINOPHEN 325 MG PO TABS: 650.0000 mg | ORAL_TABLET | ORAL | Status: DC | PRN

## 2020-08-08 MED ORDER — NITROGLYCERIN 2 % TD OINT
1.0000 [in_us] | TOPICAL_OINTMENT | Freq: Once | TRANSDERMAL | Status: AC
  Administered 2020-08-08: 1 [in_us] via TOPICAL
  Filled 2020-08-08: qty 1

## 2020-08-08 MED ORDER — FUROSEMIDE 10 MG/ML IJ SOLN: 60.0000 mg | Freq: Once | INTRAMUSCULAR | Status: AC

## 2020-08-08 MED ORDER — RIVAROXABAN 20 MG PO TABS
20.0000 mg | ORAL_TABLET | Freq: Every day | ORAL | Status: DC
  Administered 2020-08-09 – 2020-08-11 (×3): 20 mg via ORAL
  Filled 2020-08-08 (×3): qty 1

## 2020-08-08 MED ORDER — INSULIN ASPART 100 UNIT/ML ~~LOC~~ SOLN
0.0000 [IU] | Freq: Three times a day (TID) | SUBCUTANEOUS | Status: DC
  Administered 2020-08-09: 5 [IU] via SUBCUTANEOUS
  Administered 2020-08-09: 28 [IU] via SUBCUTANEOUS
  Administered 2020-08-09: 5 [IU] via SUBCUTANEOUS

## 2020-08-08 MED ORDER — ALBUTEROL SULFATE HFA 108 (90 BASE) MCG/ACT IN AERS
8.0000 | INHALATION_SPRAY | Freq: Once | RESPIRATORY_TRACT | Status: AC
  Administered 2020-08-08: 8 via RESPIRATORY_TRACT
  Filled 2020-08-08: qty 6.7

## 2020-08-08 MED ORDER — FUROSEMIDE 10 MG/ML IJ SOLN
40.0000 mg | Freq: Every day | INTRAMUSCULAR | Status: DC
  Administered 2020-08-09 – 2020-08-11 (×3): 40 mg via INTRAVENOUS
  Filled 2020-08-08 (×3): qty 4

## 2020-08-08 MED ORDER — FUROSEMIDE 10 MG/ML IJ SOLN
INTRAMUSCULAR | Status: AC
  Administered 2020-08-08: 60 mg via INTRAVENOUS
  Filled 2020-08-08: qty 2

## 2020-08-08 NOTE — ED Triage Notes (Signed)
[facility] - pt c/o SOB. Pt was 55% on RA upon EMS arrival, pt placed on 15LPM via NRB O2 increased to 95%.

## 2020-08-08 NOTE — ED Notes (Signed)
Attempted to call report

## 2020-08-08 NOTE — ED Provider Notes (Signed)
[facility] EMERGENCY DEPARTMENT Provider Note   CSN: [id number] Arrival date & time: 08/08/20  0422   Time seen 4:30 AM  History Chief Complaint  Patient presents with  . Shortness of Breath   Level 5 caveat for respiratory distress  Jenny Glover is a 74 y.o. female.  HPI   EMS presents with patient stating her initial pulse ox was 55% on room air.  She was placed on a nonrebreather at 15 L/min and her oxygen improved to 95%.  Patient states she started feeling bad about a week ago.  She has had a dry cough, sore throat, rhinorrhea without chest pain, nausea, vomiting, or diarrhea.  She does not smoke.  PCP Reynold Bowen, MD   Past Medical History:  Diagnosis Date  . Anxiety   . Atrial fibrillation ([place])    s/p DCCV 2005, 2006. Placed on Tikosyn 2006 (negative adenosine cardiolite 03/2005, could not measure EF).  . Depression   . Diabetes mellitus without complication ([place])   . Diabetic foot ulcer ([place])   . High cholesterol   . History of cardioversion 2005/2006  . Hyperlipemia   . Hypertension   . Mental status change   . Neuropathy   . Obesity   . Skin cancer of face    treated with radiation    Patient Active Problem List   Diagnosis Date Noted  . S/P right hip fracture   . Pressure injury of skin 08/17/2019  . Closed right hip fracture, initial encounter ([place]) 08/15/2019  . ARF (acute renal failure) ([place]) 08/15/2019  . Aortic stenosis 07/08/2018  . Senile dementia, uncomplicated ([place]) 40/98/1191  . OSA on CPAP 12/30/2015  . CAD (coronary artery disease) 08/23/2012  . Morbid obesity ([place])   . DM (diabetes mellitus) with peripheral vascular complication ([place]) 47/82/9562  . Hypertensive heart disease   . Chronic atrial fibrillation   . Hyperlipidemia     Past Surgical History:  Procedure Laterality Date  . INTRAMEDULLARY (IM) NAIL INTERTROCHANTERIC Right 08/19/2019   Procedure: INTRAMEDULLARY (IM) NAIL INTERTROCHANTRIC;  Surgeon: Rod Can, MD;   Location: [place];  Service: Orthopedics;  Laterality: Right;  . LEFT HEART CATHETERIZATION WITH CORONARY ANGIOGRAM N/A 08/23/2012   Procedure: LEFT HEART CATHETERIZATION WITH CORONARY ANGIOGRAM;  Surgeon: Jettie Booze, MD;  Location: [facility] CATH LAB;  Service: Cardiovascular;  Laterality: N/A;  . PERCUTANEOUS CORONARY STENT INTERVENTION (PCI-S)  08/23/2012   Procedure: PERCUTANEOUS CORONARY STENT INTERVENTION (PCI-S);  Surgeon: Jettie Booze, MD;  Location: [facility] CATH LAB;  Service: Cardiovascular;;  . SKIN CANCER EXCISION    . TENOTOMY       OB History   No obstetric history on file.     Family History  Problem Relation Age of Onset  . Diabetes Father   . Coronary artery disease Father   . Stroke Mother     Social History   Tobacco Use  . Smoking status: Never Smoker  . Smokeless tobacco: Never Used  Vaping Use  . Vaping Use: Never used  Substance Use Topics  . Alcohol use: No    Alcohol/week: 0.0 standard drinks  . Drug use: No    Home Medications Prior to Admission medications   Medication Sig Start Date End Date Taking? Authorizing Provider  acetaminophen (TYLENOL) 500 MG tablet Take 1 tablet (500 mg total) by mouth every 6 (six) hours as needed (pain). 08/23/19   Arrien, Jimmy Picket, MD  amLODipine (NORVASC) 5 MG tablet Take 5 mg by mouth daily.  [provider]  atorvastatin (LIPITOR) 80 MG tablet Take 1 tablet (80 mg total) by mouth daily at 6 PM. 08/15/19   Loel Dubonnet, NP  blood glucose meter kit and supplies KIT Dispense based on patient and insurance preference. Use up to four times daily as directed. (FOR ICD-9 250.00, 250.01). 02/15/15   Rivet, Sindy Guadeloupe, MD  cholecalciferol (VITAMIN D) 1000 UNITS tablet Take 2,000 Units by mouth daily.    [provider]  escitalopram (LEXAPRO) 10 MG tablet Take 10 mg daily by mouth.    [provider]  insulin glargine (LANTUS) 100 UNIT/ML injection Inject 0.15 mLs (15 Units total) into  the skin at bedtime. Patient taking differently: Inject 50 Units into the skin daily. Breakfast. 02/15/15   Rivet, Sindy Guadeloupe, MD  insulin lispro (HUMALOG) 100 UNIT/ML injection Inject 10 Units into the skin 3 (three) times daily before meals.     [provider]  memantine (NAMENDA) 5 MG tablet Take 5 mg 2 (two) times daily by mouth.    [provider]  metoprolol succinate (TOPROL-XL) 100 MG 24 hr tablet Take 100 mg by mouth daily. Take with or immediately following a meal.    [provider]  Rivaroxaban (XARELTO) 20 MG TABS Take 1 tablet (20 mg total) by mouth daily. 08/24/12   Reynold Bowen, MD    Allergies    Patient has no known allergies.  Review of Systems   Review of Systems  Unable to perform ROS: Severe respiratory distress    Physical Exam ED Triage Vitals  Enc Vitals Group     BP 08/08/20 0433 (!) 162/72     Pulse Rate 08/08/20 0433 75     Resp 08/08/20 0433 (!) 31     Temp 08/08/20 0500 98.2 F (36.8 C)     Temp src --      SpO2 08/08/20 0433 94 %     Weight 08/08/20 0428 189 lb 9.5 oz (86 kg)     Height 08/08/20 0428 _0  (1.778 m)     Head Circumference --      Peak Flow --      Pain Score 08/08/20 0427 0     Pain Loc --      Pain Edu? --      Excl. in [place]? --        Physical Exam Constitutional:      General: She is in acute distress.     Appearance: She is obese. She is not toxic-appearing.  HENT:     Head: Normocephalic and atraumatic.     Right Ear: External ear normal.     Left Ear: External ear normal.  Eyes:     Extraocular Movements: Extraocular movements intact.     Conjunctiva/sclera: Conjunctivae normal.     Pupils: Pupils are equal, round, and reactive to light.  Cardiovascular:     Rate and Rhythm: Normal rate. Rhythm irregular.     Pulses: Normal pulses.  Pulmonary:     Effort: Tachypnea, accessory muscle usage, prolonged expiration and respiratory distress present.     Breath sounds: Decreased air movement  present. Wheezing present.  Musculoskeletal:     Cervical back: Normal range of motion.     Right lower leg: No edema.     Left lower leg: No edema.  Skin:    General: Skin is warm and dry.  Neurological:     General: No focal deficit present.     Mental  Status: She is alert.  Psychiatric:        Mood and Affect: Affect is flat.        Speech: Speech is delayed.        Behavior: Behavior is slowed.     ED Results / Procedures / Treatments   Labs (all labs ordered are listed, but only abnormal results are displayed) Results for orders placed or performed during the hospital encounter of 08/08/20  Resp Panel by RT-PCR (Flu A&B, Covid) Nasopharyngeal Swab   Specimen: Nasopharyngeal Swab; Nasopharyngeal(NP) swabs in vial transport medium  Result Value Ref Range   SARS Coronavirus 2 by RT PCR NEGATIVE NEGATIVE   Influenza A by PCR NEGATIVE NEGATIVE   Influenza B by PCR NEGATIVE NEGATIVE  Lactic acid, plasma  Result Value Ref Range   Lactic Acid, Venous 1.8 0.5 - 1.9 mmol/L  CBC WITH DIFFERENTIAL  Result Value Ref Range   WBC 17.2 (H) 4.0 - 10.5 K/uL   RBC 3.81 (L) 3.87 - 5.11 MIL/uL   Hemoglobin 11.0 (L) 12.0 - 15.0 g/dL   HCT 37.1 36 - 46 %   MCV 97.4 80.0 - 100.0 fL   MCH 28.9 26.0 - 34.0 pg   MCHC 29.6 (L) 30.0 - 36.0 g/dL   RDW 14.4 11.5 - 15.5 %   Platelets 249 150 - 400 K/uL   nRBC 0.0 0.0 - 0.2 %   Neutrophils Relative % 80 %   Neutro Abs 14.0 (H) 1.7 - 7.7 K/uL   Lymphocytes Relative 8 %   Lymphs Abs 1.3 0.7 - 4.0 K/uL   Monocytes Relative 7 %   Monocytes Absolute 1.1 (H) 0.1 - 1.0 K/uL   Eosinophils Relative 3 %   Eosinophils Absolute 0.5 0.0 - 0.5 K/uL   Basophils Relative 1 %   Basophils Absolute 0.1 0.0 - 0.1 K/uL   Immature Granulocytes 1 %   Abs Immature Granulocytes 0.09 (H) 0.00 - 0.07 K/uL  Comprehensive metabolic panel  Result Value Ref Range   Sodium 138 135 - 145 mmol/L   Potassium 4.5 3.5 - 5.1 mmol/L   Chloride 105 98 - 111 mmol/L   CO2 24  22 - 32 mmol/L   Glucose, Bld 255 (H) 70 - 99 mg/dL   BUN 32 (H) 8 - 23 mg/dL   Creatinine, Ser 1.22 (H) 0.44 - 1.00 mg/dL   Calcium 8.6 (L) 8.9 - 10.3 mg/dL   Total Protein 7.3 6.5 - 8.1 g/dL   Albumin 3.2 (L) 3.5 - 5.0 g/dL   AST 18 15 - 41 U/L   ALT 13 0 - 44 U/L   Alkaline Phosphatase 122 38 - 126 U/L   Total Bilirubin 0.5 0.3 - 1.2 mg/dL   GFR, Estimated 47 (L) >60 mL/min   Anion gap 9 5 - 15  Procalcitonin  Result Value Ref Range   Procalcitonin <0.10 ng/mL  Lactate dehydrogenase  Result Value Ref Range   LDH 173 98 - 192 U/L  Triglycerides  Result Value Ref Range   Triglycerides 115 <150 mg/dL  Brain natriuretic peptide  Result Value Ref Range   B Natriuretic Peptide 418.0 (H) 0.0 - 100.0 pg/mL   Laboratory interpretation all normal except mild elevation of BNP, malnutrition, renal insufficiency, hyperglycemia, leukocytosis, mild anemia    EKG EKG Interpretation  Date/Time:  Thursday August 08 2020 04:30:53 EST Ventricular Rate:  80 PR Interval:    QRS Duration: 100 QT Interval:  378 QTC Calculation: 436 R Axis:   -  80 Text Interpretation: Atrial fibrillation Left anterior fascicular block Probable left ventricular hypertrophy No significant change since last tracing 15 Aug 2019 Confirmed by Rolland Porter [phone]) on 08/08/2020 4:41:35 AM   Radiology DG Chest Port 1 View  Result Date: 08/08/2020 CLINICAL DATA:  Shortness of breath and hypoxia. Decreased oxygen saturation. EXAM: PORTABLE CHEST 1 VIEW COMPARISON:  08/15/2019 FINDINGS: Cardiac enlargement. Patchy perihilar infiltrates in both lungs, possibly edema or multifocal pneumonia. No pleural effusions. No pneumothorax. Mediastinal contours appear intact. Calcification of the aorta. IMPRESSION: Cardiac enlargement with patchy perihilar infiltrates bilaterally suggesting edema or multifocal pneumonia. Electronically Signed   By: Lucienne Capers M.D.   On: 08/08/2020 05:19    Procedures .Critical  Care Performed by: Rolland Porter, MD Authorized by: Rolland Porter, MD   Critical care provider statement:    Critical care time (minutes):  39   Critical care was necessary to treat or prevent imminent or life-threatening deterioration of the following conditions:  Respiratory failure   Critical care was time spent personally by me on the following activities:  Discussions with consultants, examination of patient, obtaining history from patient or surrogate, ordering and review of laboratory studies, ordering and review of radiographic studies, pulse oximetry, re-evaluation of patient's condition and review of old charts   (including critical care time)  Medications Ordered in ED Medications  albuterol (VENTOLIN HFA) 108 (90 Base) MCG/ACT inhaler 8 puff (8 puffs Inhalation Given 08/08/20 0455)  methylPREDNISolone sodium succinate (SOLU-MEDROL) 125 mg/2 mL injection 125 mg (125 mg Intravenous Given 08/08/20 0452)  furosemide (LASIX) injection 60 mg (60 mg Intravenous Given 08/08/20 0449)  nitroGLYCERIN (NITROGLYN) 2 % ointment 1 inch (1 inch Topical Given 08/08/20 0453)  aerochamber Z-Stat Plus/medium 1 each (1 each Other Given 08/08/20 2595)    ED Course  I have reviewed the triage vital signs and the nursing notes.  Pertinent labs & imaging results that were available during my care of the patient were reviewed by me and considered in my medical decision making (see chart for details).    MDM Rules/Calculators/A&P                          My first impression after examining the patient was that she may be in congestive heart failure.  Laboratory testing was done, including testing for Covid.  With the oxygen that low that was my first impression before I saw the patient was that she was a Covid patient.  Chest x-ray was ordered.  Patient was given Lasix IV, nitroglycerin paste was placed on her chest, her blood pressure was 162/72.  She was given albuterol 8 puffs by inhaler and Solu-Medrol  IV.  Recheck at 5:55 AM patient is resting, she states she feels better.  When I listen to her now I am able to hear a murmur now.  Her lungs are clear.  Her respiratory rate is 25, pulse ox is 99 to 100% on the nonrebreather and her heart rate is 64.  I talked to the patient that she was initially very hypoxic and she would need to be admitted and she is agreeable.  6:29 AM Dr. Josephine Cables, hospitalist will admit.  Final Clinical Impression(s) / ED Diagnoses Final diagnoses:  Hypoxia  Congestive heart failure, unspecified HF chronicity, unspecified heart failure type ([place])  Bronchospasm    Rx / DC Orders  Plan admission  Rolland Porter, MD, Barbette Or, MD 08/08/20 [phone]

## 2020-08-08 NOTE — ED Notes (Signed)
Pt placed on 6L [place]. 

## 2020-08-08 NOTE — ED Notes (Signed)
Report called to [place]

## 2020-08-08 NOTE — H&P (Signed)
History and Physical  Jenny Glover IPJ:[id number] DOB: [birth year] DOA: 08/08/2020   PCP: Reynold Bowen, MD   Patient coming from: Home  Chief Complaint: sob  HPI:  Jenny Glover is a 74 y.o. female with medical history of chronic atrial fibrillation status post DCCV 2006, diabetes mellitus type 2, hypertension, hyperlipidemia, dementia presenting with 1 day history of shortness of breath. The patient is a poor historian secondary to her dementia. Much of this history is obtained from review of the medical record and speaking with the patient's spouse. Apparently, the patient had been in her usual state of health until 08/07/2020 when she developed shortness of breath with a nonproductive cough. There is no reports of fevers, chills, chest pain, headache, hemoptysis, nausea, vomiting, diarrhea, abdominal pain, dysuria, hematuria. He states that her mental status has been at her usual baseline. She has been compliant with her medications. There is been no recent changes in her medications. Spouse does note that patient has chronic leg edema which has not significantly changed. In the emergency department, the patient was afebrile and hemodynamically stable with oxygen saturation 100% on nonrebreather. When she initially presented, she had oxygen saturation of 55% on room air. BMP was essentially unremarkable with her serum creatinine of 1.22 which is her usual baseline. WBC 17.2, hemoglobin 10.0, platelet 249,000. The patient was given IV furosemide. Chest x-ray showed bilateral perihilar infiltrates. BNP was 418. Lactic acid 1.8. PCT <0.10.  She was given IV lasix and IV solumedrol  Assessment/Plan: Acute respiratory failure with hypoxia -Secondary to pulmonary edema -Currently stable on nonrebreather -Wean as tolerated for saturation greater 92% -Chest xray shows bilateral perihilar infiltrates  Acute diastolic CHF -Continue IV furosemide -Continue metoprolol succinate -07/22/2018  echo EF 60 to 65%, moderate AS, mild MR, no WMA, PASP 56 -Echocardiogram -Accurate I's and O's -Daily weights  Diabetes mellitus type 2 -Hemoglobin A1c -Start reduced dose Lantus -NovoLog sliding scale  CKD stage IIIb -Baseline creatinine 1.2-1.5 -Monitor with diuresis  Leukocytosis -Suspect stress demargination -PCT <0.10 -Plan to repeat chest x-ray 08/09/2020 if no continued improvement -Obtain UA -Remain off antibiotics for now -Lactic acid 1.8  Chronic atrial fibrillation -Rate controlled -Continue metoprolol succinate -Continue rivaroxaban  Essential hypertension -Continue metoprolol succinate -Holding amlodipine to allow blood pressure margin for diuresis  Hyperlipidemia -Continue statin  Dementia without behavioral disturbance -Continue Namenda        Past Medical History:  Diagnosis Date  . Anxiety   . Atrial fibrillation ([place])    s/p DCCV 2005, 2006. Placed on Tikosyn 2006 (negative adenosine cardiolite 03/2005, could not measure EF).  . Depression   . Diabetes mellitus without complication ([place])   . Diabetic foot ulcer ([place])   . High cholesterol   . History of cardioversion 2005/2006  . Hyperlipemia   . Hypertension   . Mental status change   . Neuropathy   . Obesity   . Skin cancer of face    treated with radiation   Past Surgical History:  Procedure Laterality Date  . INTRAMEDULLARY (IM) NAIL INTERTROCHANTERIC Right 08/19/2019   Procedure: INTRAMEDULLARY (IM) NAIL INTERTROCHANTRIC;  Surgeon: Rod Can, MD;  Location: [facility];  Service: Orthopedics;  Laterality: Right;  . LEFT HEART CATHETERIZATION WITH CORONARY ANGIOGRAM N/A 08/23/2012   Procedure: LEFT HEART CATHETERIZATION WITH CORONARY ANGIOGRAM;  Surgeon: Jettie Booze, MD;  Location: [facility] CATH LAB;  Service: Cardiovascular;  Laterality: N/A;  . PERCUTANEOUS CORONARY STENT INTERVENTION (PCI-S)  08/23/2012  Procedure: PERCUTANEOUS CORONARY STENT INTERVENTION (PCI-S);  Surgeon:  Jettie Booze, MD;  Location: [facility] C[facility] LAB;  Service: Cardiovascular;;  . SKIN CANCER EXCISION    . TENOTOMY     Social History:  reports that she has never smoked. She has never used smokeless tobacco. She reports that she does not drink alcohol and does not use drugs.   Family History  Problem Relation Age of Onset  . Diabetes Father   . Coronary artery disease Father   . Stroke Mother      No Known Allergies   Prior to Admission medications   Medication Sig Start Date End Date Taking? Authorizing Provider  acetaminophen (TYLENOL) 500 MG tablet Take 1 tablet (500 mg total) by mouth every 6 (six) hours as needed (pain). 08/23/19   Arrien, Jimmy Picket, MD  amLODipine (NORVASC) 5 MG tablet Take 5 mg by mouth daily.    [provider]  atorvastatin (LIPITOR) 80 MG tablet Take 1 tablet (80 mg total) by mouth daily at 6 PM. 08/15/19   Loel Dubonnet, NP  blood glucose meter kit and supplies KIT Dispense based on patient and insurance preference. Use up to four times daily as directed. (FOR ICD-9 250.00, 250.01). 02/15/15   Rivet, Sindy Guadeloupe, MD  cholecalciferol (VITAMIN D) 1000 UNITS tablet Take 2,000 Units by mouth daily.    [provider]  escitalopram (LEXAPRO) 10 MG tablet Take 10 mg daily by mouth.    [provider]  insulin glargine (LANTUS) 100 UNIT/ML injection Inject 0.15 mLs (15 Units total) into the skin at bedtime. Patient taking differently: Inject 50 Units into the skin daily. Breakfast. 02/15/15   Rivet, Sindy Guadeloupe, MD  insulin lispro (HUMALOG) 100 UNIT/ML injection Inject 10 Units into the skin 3 (three) times daily before meals.     [provider]  memantine (NAMENDA) 5 MG tablet Take 5 mg 2 (two) times daily by mouth.    [provider]  metoprolol succinate (TOPROL-XL) 100 MG 24 hr tablet Take 100 mg by mouth daily. Take with or immediately following a meal.    [provider]  Rivaroxaban (XARELTO) 20 MG TABS Take  1 tablet (20 mg total) by mouth daily. 08/24/12   Reynold Bowen, MD    Review of Systems:  Limited due to patient's dementia Physical Exam: Vitals:   08/08/20 0500 08/08/20 0530 08/08/20 0600 08/08/20 0630  BP: (!) 126/100   (!) 133/98  Pulse: 77 74 62 75  Resp: (!) 21 (!) 32 (!) 24 (!) 29  Temp: 98.2 F (36.8 C)     SpO2: 99% 100% 100% 99%  Weight:      Height:       General:  A&O x 1, NAD, nontoxic, pleasant/cooperative Head/Eye: No conjunctival hemorrhage, no icterus, [place]/AT, No nystagmus ENT:  No icterus,  No thrush, good dentition, no pharyngeal exudate Neck:  No masses, no lymphadenpathy, no bruits CV:  IRRR, no rub, no gallop, no S3 Lung:  Bilateral crackles. No wheeze Abdomen: soft/NT, +BS, nondistended, no peritoneal signs Ext: No cyanosis, No rashes, No petechiae, No lymphangitis, 2+LE edema Neuro: CNII-XII intact, strength 4/5 in bilateral upper and lower extremities, no dysmetria  Labs on Admission:  Basic Metabolic Panel: Recent Labs  Lab 08/08/20 0500  NA 138  K 4.5  CL 105  CO2 24  GLUCOSE 255*  BUN 32*  CREATININE 1.22*  CALCIUM 8.6*   Liver Function Tests: Recent Labs  Lab 08/08/20 0500  AST 18  ALT 13  ALKPHOS 122  BILITOT 0.5  PROT 7.3  ALBUMIN 3.2*   No results for input(s): LIPASE, AMYLASE in the last 168 hours. No results for input(s): AMMONIA in the last 168 hours. CBC: Recent Labs  Lab 08/08/20 0500  WBC 17.2*  NEUTROABS 14.0*  HGB 11.0*  HCT 37.1  MCV 97.4  PLT 249   Coagulation Profile: No results for input(s): INR, PROTIME in the last 168 hours. Cardiac Enzymes: No results for input(s): CKTOTAL, CKMB, CKMBINDEX, TROPONINI in the last 168 hours. BNP: Invalid input(s): POCBNP CBG: No results for input(s): GLUCAP in the last 168 hours. Urine analysis:    Component Value Date/Time   COLORURINE AMBER (A) 08/17/2019 1022   APPEARANCEUR CLOUDY (A) 08/17/2019 1022   LABSPEC 1.021 08/17/2019 1022   PHURINE 5.0 08/17/2019  1022   GLUCOSEU 150 (A) 08/17/2019 1022   HGBUR MODERATE (A) 08/17/2019 1022   BILIRUBINUR NEGATIVE 08/17/2019 1022   KETONESUR NEGATIVE 08/17/2019 1022   PROTEINUR 30 (A) 08/17/2019 1022   UROBILINOGEN 0.2 02/15/2015 1341   NITRITE NEGATIVE 08/17/2019 1022   LEUKOCYTESUR MODERATE (A) 08/17/2019 1022   Sepsis Labs: @LABRCNTIP (procalcitonin:4,lacticidven:4) ) Recent Results (from the past 240 hour(s))  Resp Panel by RT-PCR (Flu A&B, Covid) Nasopharyngeal Swab     Status: None   Collection Time: 08/08/20  5:00 AM   Specimen: Nasopharyngeal Swab; Nasopharyngeal(NP) swabs in vial transport medium  Result Value Ref Range Status   SARS Coronavirus 2 by RT PCR NEGATIVE NEGATIVE Final    Comment: (NOTE) SARS-CoV-2 target nucleic acids are NOT DETECTED.  The SARS-CoV-2 RNA is generally detectable in upper respiratory specimens during the acute phase of infection. The lowest concentration of SARS-CoV-2 viral copies this assay can detect is 138 copies/mL. A negative result does not preclude SARS-Cov-2 infection and should not be used as the sole basis for treatment or other patient management decisions. A negative result may occur with  improper specimen collection/handling, submission of specimen other than nasopharyngeal swab, presence of viral mutation(s) within the areas targeted by this assay, and inadequate number of viral copies(<138 copies/mL). A negative result must be combined with clinical observations, patient history, and epidemiological information. The expected result is Negative.  Fact Sheet for Patients:  EntrepreneurPulse.com.au  Fact Sheet for Healthcare Providers:  IncredibleEmployment.be  This test is no t yet approved or cleared by the [place] FDA and  has been authorized for detection and/or diagnosis of SARS-CoV-2 by FDA under an Emergency Use Authorization (EUA). This EUA will remain  in effect (meaning this test can  be used) for the duration of the COVID-19 declaration under Section 564(b)(1) of the Act, 21 U.S.C.section 360bbb-3(b)(1), unless the authorization is terminated  or revoked sooner.       Influenza A by PCR NEGATIVE NEGATIVE Final   Influenza B by PCR NEGATIVE NEGATIVE Final    Comment: (NOTE) The Xpert Xpress SARS-CoV-2/FLU/RSV plus assay is intended as an aid in the diagnosis of influenza from Nasopharyngeal swab specimens and should not be used as a sole basis for treatment. Nasal washings and aspirates are unacceptable for Xpert Xpress SARS-CoV-2/FLU/RSV testing.  Fact Sheet for Patients: EntrepreneurPulse.com.au  Fact Sheet for Healthcare Providers: IncredibleEmployment.be  This test is not yet approved or cleared by the [place] FDA and has been authorized for detection and/or diagnosis of SARS-CoV-2 by FDA under an Emergency Use Authorization (EUA). This EUA will remain in effect (meaning this test can be used) for the duration  of the COVID-19 declaration under Section 564(b)(1) of the Act, 21 U.S.C. section 360bbb-3(b)(1), unless the authorization is terminated or revoked.  Performed at [facility], [address], [place], [place] [address]   Blood Culture (routine x 2)     Status: None (Preliminary result)   Collection Time: 08/08/20  5:30 AM   Specimen: BLOOD RIGHT HAND  Result Value Ref Range Status   Specimen Description BLOOD RIGHT HAND  Final   Special Requests   Final    BOTTLES DRAWN AEROBIC AND ANAEROBIC Blood Culture results may not be optimal due to an inadequate volume of blood received in culture bottles Performed at [facility], [address], [place],  [address]    Culture PENDING  Incomplete   Report Status PENDING  Incomplete  Blood Culture (routine x 2)     Status: None (Preliminary result)   Collection Time: 08/08/20  5:47 AM   Specimen: BLOOD LEFT HAND  Result Value Ref Range Status   Specimen  Description BLOOD LEFT HAND  Final   Special Requests   Final    BOTTLES DRAWN AEROBIC ONLY Blood Culture results may not be optimal due to an inadequate volume of blood received in culture bottles Performed at [facility], [address] [place],  [address]    Culture PENDING  Incomplete   Report Status PENDING  Incomplete     Radiological Exams on Admission: DG Chest Port 1 View  Result Date: 08/08/2020 CLINICAL DATA:  Shortness of breath and hypoxia. Decreased oxygen saturation. EXAM: PORTABLE CHEST 1 VIEW COMPARISON:  08/15/2019 FINDINGS: Cardiac enlargement. Patchy perihilar infiltrates in both lungs, possibly edema or multifocal pneumonia. No pleural effusions. No pneumothorax. Mediastinal contours appear intact. Calcification of the aorta. IMPRESSION: Cardiac enlargement with patchy perihilar infiltrates bilaterally suggesting edema or multifocal pneumonia. Electronically Signed   By: Lucienne Capers M.D.   On: 08/08/2020 05:19    EKG: Independently reviewed. Afib, nonspecific STT change    Time spent:60 minutes Code Status:   FULL Family Communication:  Spouse updated 11/25 Disposition Plan: expect 2-3 day hospitalization Consults called: none DVT Prophylaxis: Xarelto  Orson Eva, DO  [facility] Pager [phone]  If 7PM-7AM, please contact night-coverage [email or web address] Password [facility] 08/08/2020, 7:42 AM

## 2020-08-09 ENCOUNTER — Inpatient Hospital Stay (HOSPITAL_COMMUNITY): Payer: Medicare HMO

## 2020-08-09 DIAGNOSIS — I5031 Acute diastolic (congestive) heart failure: Secondary | ICD-10-CM

## 2020-08-09 DIAGNOSIS — J9601 Acute respiratory failure with hypoxia: Secondary | ICD-10-CM | POA: Diagnosis not present

## 2020-08-09 DIAGNOSIS — I350 Nonrheumatic aortic (valve) stenosis: Secondary | ICD-10-CM | POA: Diagnosis not present

## 2020-08-09 DIAGNOSIS — I482 Chronic atrial fibrillation, unspecified: Secondary | ICD-10-CM | POA: Diagnosis not present

## 2020-08-09 LAB — URINALYSIS, COMPLETE (UACMP) WITH MICROSCOPIC
Bilirubin Urine: NEGATIVE
Glucose, UA: 50 mg/dL — AB
Ketones, ur: NEGATIVE mg/dL
Nitrite: NEGATIVE
Protein, ur: 100 mg/dL — AB
Specific Gravity, Urine: 1.021 (ref 1.005–1.030)
WBC, UA: >50 WBC/hpf — ABNORMAL HIGH (ref 0–5)
pH: 6 (ref 5.0–8.0)

## 2020-08-09 LAB — PROCALCITONIN: Procalcitonin: 2.06 ng/mL

## 2020-08-09 LAB — BASIC METABOLIC PANEL
Anion gap: 8 (ref 5–15)
BUN: 38 mg/dL — ABNORMAL HIGH (ref 8–23)
CO2: 28 mmol/L (ref 22–32)
Calcium: 9.1 mg/dL (ref 8.9–10.3)
Chloride: 102 mmol/L (ref 98–111)
Creatinine, Ser: 1.13 mg/dL — ABNORMAL HIGH (ref 0.44–1.00)
GFR, Estimated: 51 mL/min — ABNORMAL LOW (ref >60)
Glucose, Bld: 277 mg/dL — ABNORMAL HIGH (ref 70–99)
Potassium: 4.6 mmol/L (ref 3.5–5.1)
Sodium: 138 mmol/L (ref 135–145)

## 2020-08-09 LAB — CBC
HCT: 32.8 % — ABNORMAL LOW (ref 36.0–46.0)
Hemoglobin: 10.3 g/dL — ABNORMAL LOW (ref 12.0–15.0)
MCH: 29.6 pg (ref 26.0–34.0)
MCHC: 31.4 g/dL (ref 30.0–36.0)
MCV: 94.3 fL (ref 80.0–100.0)
Platelets: 251 10*3/uL (ref 150–400)
RBC: 3.48 MIL/uL — ABNORMAL LOW (ref 3.87–5.11)
RDW: 14.1 % (ref 11.5–15.5)
WBC: 11.6 10*3/uL — ABNORMAL HIGH (ref 4.0–10.5)
nRBC: 0 % (ref 0.0–0.2)

## 2020-08-09 LAB — GLUCOSE, CAPILLARY
Glucose-Capillary: 281 mg/dL — ABNORMAL HIGH (ref 70–99)
Glucose-Capillary: 218 mg/dL — ABNORMAL HIGH (ref 70–99)
Glucose-Capillary: 249 mg/dL — ABNORMAL HIGH (ref 70–99)
Glucose-Capillary: 463 mg/dL — ABNORMAL HIGH (ref 70–99)
Glucose-Capillary: 264 mg/dL — ABNORMAL HIGH (ref 70–99)

## 2020-08-09 LAB — ECHOCARDIOGRAM COMPLETE
AR max vel: 0.76 cm2
AV Area VTI: 0.79 cm2
AV Area mean vel: 0.85 cm2
AV Mean grad: 34.8 mmHg
AV Peak grad: 61.8 mmHg
Ao pk vel: 3.93 m/s
Area-P 1/2: 3.72 cm2
Height: 70 in
P 1/2 time: 561 msec
S' Lateral: 2.5 cm
Weight: 3894.21 oz

## 2020-08-09 LAB — HEMOGLOBIN A1C
Hgb A1c MFr Bld: 6.4 % — ABNORMAL HIGH (ref 4.8–5.6)
Mean Plasma Glucose: 136.98 mg/dL

## 2020-08-09 LAB — MAGNESIUM: Magnesium: 2.1 mg/dL (ref 1.7–2.4)

## 2020-08-09 MED ORDER — INSULIN GLARGINE 100 UNIT/ML ~~LOC~~ SOLN
40.0000 [IU] | Freq: Every day | SUBCUTANEOUS | Status: DC
  Administered 2020-08-10: 40 [IU] via SUBCUTANEOUS
  Filled 2020-08-09 (×4): qty 0.4

## 2020-08-09 MED ORDER — INSULIN ASPART 100 UNIT/ML ~~LOC~~ SOLN
0.0000 [IU] | Freq: Every day | SUBCUTANEOUS | Status: DC
  Administered 2020-08-09: 3 [IU] via SUBCUTANEOUS

## 2020-08-09 MED ORDER — AZITHROMYCIN 250 MG PO TABS
500.0000 mg | ORAL_TABLET | Freq: Every day | ORAL | Status: DC
  Administered 2020-08-09 – 2020-08-11 (×3): 500 mg via ORAL
  Filled 2020-08-09 (×3): qty 2

## 2020-08-09 MED ORDER — SODIUM CHLORIDE 0.9 % IV SOLN
1.0000 g | INTRAVENOUS | Status: DC
  Administered 2020-08-09 – 2020-08-10 (×2): 1 g via INTRAVENOUS
  Filled 2020-08-09 (×2): qty 10

## 2020-08-09 MED ORDER — INSULIN ASPART 100 UNIT/ML ~~LOC~~ SOLN
0.0000 [IU] | Freq: Three times a day (TID) | SUBCUTANEOUS | Status: DC
  Administered 2020-08-10 (×2): 7 [IU] via SUBCUTANEOUS
  Administered 2020-08-10: 15 [IU] via SUBCUTANEOUS
  Administered 2020-08-11 (×2): 4 [IU] via SUBCUTANEOUS

## 2020-08-09 MED ORDER — INSULIN ASPART 100 UNIT/ML ~~LOC~~ SOLN: 28.0000 [IU] | Freq: Once | SUBCUTANEOUS | Status: DC

## 2020-08-09 NOTE — Progress Notes (Addendum)
PROGRESS NOTE  Jenny Glover MOQ:[id number] DOB: Apr 12, 1946 DOA: 08/08/2020 PCP: Reynold Bowen, MD  Brief History:  74 y.o. female with medical history of chronic atrial fibrillation status post DCCV 2006, diabetes mellitus type 2, hypertension, hyperlipidemia, dementia presenting with 1 day history of shortness of breath. The patient is a poor historian secondary to her dementia. Much of this history is obtained from review of the medical record and speaking with the patient's spouse. Apparently, the patient had been in her usual state of health until 08/07/2020 when she developed shortness of breath with a nonproductive cough. There is no reports of fevers, chills, chest pain, headache, hemoptysis, nausea, vomiting, diarrhea, abdominal pain, dysuria, hematuria. He states that her mental status has been at her usual baseline. She has been compliant with her medications. There is been no recent changes in her medications. Spouse does note that patient has chronic leg edema which has not significantly changed. In the emergency department, the patient was afebrile and hemodynamically stable with oxygen saturation 100% on nonrebreather. When she initially presented, she had oxygen saturation of 55% on room air. BMP was essentially unremarkable with her serum creatinine of 1.22 which is her usual baseline. WBC 17.2, hemoglobin 10.0, platelet 249,000. The patient was given IV furosemide. Chest x-ray showed bilateral perihilar infiltrates. BNP was 418. Lactic acid 1.8. PCT <0.10.  She was given IV lasix and IV solumedrol  Assessment/Plan: Acute respiratory failure with hypoxia -Secondary to pulmonary edema -Currently stable on nonrebreather>>>RA -Wean as tolerated for saturation greater 92% -Chest xray shows bilateral perihilar infiltrates  Acute diastolic CHF -Continue IV furosemide -Continue metoprolol succinate--held 11/26 due to bradycardia -plan restart lower dose 11/27 -07/22/2018  echo EF 60 to 65%, moderate AS, mild MR, no WMA, PASP 56 -Echo EF 60-65%, severe AS, VTI measures 0.79 cm.  -Accurate I's and O's--NEG 2.9 L -Daily weights  Lobar PNA -start ceftriaxone and azithro -PCT <0.10-->2.06  UTI -UA>50WBC -start ceftriaxone pending culture  Aortic Stenosis -severe AS, VTI measures 0.79 cm.  -outpt cardiology followup  Diabetes mellitus type 2 -Hemoglobin A1c--6.4 -Start reduced dose Lantus -NovoLog sliding scale -CBGs elevated in part due to steroids  CKD stage IIIb -Baseline creatinine 1.2-1.5 -Monitor with diuresis  Leukocytosis -due to PNA and UTI -PCT <0.10-->2.06  Chronic atrial fibrillation -Rate controlled -Continue metoprolol succinate -Continue rivaroxaban  Essential hypertension -Continue metoprolol succinate at lower dose -restart amlodpine  Hyperlipidemia -Continue statin  Dementia without behavioral disturbance -Continue Namenda       Status is: Inpatient  Remains inpatient appropriate because:IV treatments appropriate due to intensity of illness or inability to take PO   Dispo: The patient is from: Home              Anticipated d/c is to: Home              Anticipated d/c date is: 2 days              Patient currently is not medically stable to d/c.        Family Communication:   Daughter updated at bedside 11/26  Consultants:  none  Code Status:  FULL  DVT Prophylaxis:  Xarelto   Procedures: As Listed in Progress Note Above  Antibiotics: Ceftriaxone 11/26>> azithro 11/26>>    Total time spent 35 minutes.  Greater than 50% spent face to face counseling and coordinating care.      Subjective: Patient denies fevers, chills, headache, chest  pain, dyspnea, nausea, vomiting, diarrhea, abdominal pain, dysuria, hematuria, hematochezia, and melena.   Objective: Vitals:   08/08/20 2204 08/09/20 0343 08/09/20 0536 08/09/20 1331  BP: (!) 146/63  (!) 169/68 (!) 163/75  Pulse: (!)  59  (!) 54 (!) 58  Resp: 20  20 18   Temp: 97.8 F (36.6 C)  98.2 F (36.8 C) 98 F (36.7 C)  TempSrc: Oral  Oral Oral  SpO2: 100%  100% 100%  Weight:  110.4 kg    Height:        Intake/Output Summary (Last 24 hours) at 08/09/2020 1720 Last data filed at 08/09/2020 1300 Gross per 24 hour  Intake 603 ml  Output 1825 ml  Net -1222 ml   Weight change: 25.6 kg Exam:   General:  Pt is alert, follows commands appropriately, not in acute distress  HEENT: No icterus, No thrush, No neck mass, [place]/AT  Cardiovascular: RRR, S1/S2, no rubs, no gallops  Respiratory: bibasilar crackles. No wheeze  Abdomen: Soft/+BS, non tender, non distended, no guarding  Extremities: 1+LE edema, No lymphangitis, No petechiae, No rashes, no synovitis   Data Reviewed: I have personally reviewed following labs and imaging studies Basic Metabolic Panel: Recent Labs  Lab 08/08/20 0500 08/09/20 0210  NA 138 138  K 4.5 4.6  CL 105 102  CO2 24 28  GLUCOSE 255* 277*  BUN 32* 38*  CREATININE 1.22* 1.13*  CALCIUM 8.6* 9.1  MG  --  2.1   Liver Function Tests: Recent Labs  Lab 08/08/20 0500  AST 18  ALT 13  ALKPHOS 122  BILITOT 0.5  PROT 7.3  ALBUMIN 3.2*   No results for input(s): LIPASE, AMYLASE in the last 168 hours. No results for input(s): AMMONIA in the last 168 hours. Coagulation Profile: No results for input(s): INR, PROTIME in the last 168 hours. CBC: Recent Labs  Lab 08/08/20 0500 08/09/20 0210  WBC 17.2* 11.6*  NEUTROABS 14.0*  --   HGB 11.0* 10.3*  HCT 37.1 32.8*  MCV 97.4 94.3  PLT 249 251   Cardiac Enzymes: No results for input(s): CKTOTAL, CKMB, CKMBINDEX, TROPONINI in the last 168 hours. BNP: Invalid input(s): POCBNP CBG: Recent Labs  Lab 08/08/20 2255 08/09/20 0836 08/09/20 1132  GLUCAP 281* 218* 249*   HbA1C: Recent Labs    08/09/20 0210  HGBA1C 6.4*   Urine analysis:    Component Value Date/Time   COLORURINE YELLOW 08/08/2020 2306    APPEARANCEUR CLOUDY (A) 08/08/2020 2306   LABSPEC 1.021 08/08/2020 2306   PHURINE 6.0 08/08/2020 2306   GLUCOSEU 50 (A) 08/08/2020 2306   HGBUR MODERATE (A) 08/08/2020 2306   BILIRUBINUR NEGATIVE 08/08/2020 2306   KETONESUR NEGATIVE 08/08/2020 2306   PROTEINUR 100 (A) 08/08/2020 2306   UROBILINOGEN 0.2 02/15/2015 1341   NITRITE NEGATIVE 08/08/2020 2306   LEUKOCYTESUR LARGE (A) 08/08/2020 2306   Sepsis Labs: @LABRCNTIP (procalcitonin:4,lacticidven:4) ) Recent Results (from the past 240 hour(s))  Resp Panel by RT-PCR (Flu A&B, Covid) Nasopharyngeal Swab     Status: None   Collection Time: 08/08/20  5:00 AM   Specimen: Nasopharyngeal Swab; Nasopharyngeal(NP) swabs in vial transport medium  Result Value Ref Range Status   SARS Coronavirus 2 by RT PCR NEGATIVE NEGATIVE Final    Comment: (NOTE) SARS-CoV-2 target nucleic acids are NOT DETECTED.  The SARS-CoV-2 RNA is generally detectable in upper respiratory specimens during the acute phase of infection. The lowest concentration of SARS-CoV-2 viral copies this assay can detect is 138 copies/mL. A negative  result does not preclude SARS-Cov-2 infection and should not be used as the sole basis for treatment or other patient management decisions. A negative result may occur with  improper specimen collection/handling, submission of specimen other than nasopharyngeal swab, presence of viral mutation(s) within the areas targeted by this assay, and inadequate number of viral copies(<138 copies/mL). A negative result must be combined with clinical observations, patient history, and epidemiological information. The expected result is Negative.  Fact Sheet for Patients:  EntrepreneurPulse.com.au  Fact Sheet for Healthcare Providers:  IncredibleEmployment.be  This test is no t yet approved or cleared by the [place] FDA and  has been authorized for detection and/or diagnosis of SARS-CoV-2 by FDA under  an Emergency Use Authorization (EUA). This EUA will remain  in effect (meaning this test can be used) for the duration of the COVID-19 declaration under Section 564(b)(1) of the Act, 21 U.S.C.section 360bbb-3(b)(1), unless the authorization is terminated  or revoked sooner.       Influenza A by PCR NEGATIVE NEGATIVE Final   Influenza B by PCR NEGATIVE NEGATIVE Final    Comment: (NOTE) The Xpert Xpress SARS-CoV-2/FLU/RSV plus assay is intended as an aid in the diagnosis of influenza from Nasopharyngeal swab specimens and should not be used as a sole basis for treatment. Nasal washings and aspirates are unacceptable for Xpert Xpress SARS-CoV-2/FLU/RSV testing.  Fact Sheet for Patients: EntrepreneurPulse.com.au  Fact Sheet for Healthcare Providers: IncredibleEmployment.be  This test is not yet approved or cleared by the [place] FDA and has been authorized for detection and/or diagnosis of SARS-CoV-2 by FDA under an Emergency Use Authorization (EUA). This EUA will remain in effect (meaning this test can be used) for the duration of the COVID-19 declaration under Section 564(b)(1) of the Act, 21 U.S.C. section 360bbb-3(b)(1), unless the authorization is terminated or revoked.  Performed at [facility], [address] [place], [place] [address]   Blood Culture (routine x 2)     Status: None (Preliminary result)   Collection Time: 08/08/20  5:30 AM   Specimen: BLOOD RIGHT HAND  Result Value Ref Range Status   Specimen Description BLOOD RIGHT HAND  Final   Special Requests   Final    BOTTLES DRAWN AEROBIC AND ANAEROBIC Blood Culture results may not be optimal due to an inadequate volume of blood received in culture bottles   Culture   Final    NO GROWTH 1 DAY Performed at [facility], [address] [place], [place] [address]    Report Status PENDING  Incomplete  Blood Culture (routine x 2)     Status: None (Preliminary result)    Collection Time: 08/08/20  5:47 AM   Specimen: BLOOD LEFT HAND  Result Value Ref Range Status   Specimen Description BLOOD LEFT HAND  Final   Special Requests   Final    BOTTLES DRAWN AEROBIC ONLY Blood Culture results may not be optimal due to an inadequate volume of blood received in culture bottles   Culture   Final    NO GROWTH 1 DAY Performed at [facility], [address], [place], [place] [address]    Report Status PENDING  Incomplete     Scheduled Meds: . atorvastatin  80 mg Oral q1800  . cholecalciferol  2,000 Units Oral Daily  . escitalopram  20 mg Oral Daily  . furosemide  40 mg Intravenous Daily  . insulin aspart  0-15 Units Subcutaneous TID WC  . insulin aspart  0-5 Units Subcutaneous QHS  .  insulin glargine  30 Units Subcutaneous Daily  . mouth rinse  15 mL Mouth Rinse BID  . memantine  5 mg Oral BID  . metoprolol succinate  100 mg Oral Daily  . rivaroxaban  20 mg Oral Q breakfast  . sodium chloride flush  3 mL Intravenous Q12H   Continuous Infusions: . sodium chloride      Procedures/Studies: DG Chest Port 1 View  Result Date: 08/08/2020 CLINICAL DATA:  Shortness of breath and hypoxia. Decreased oxygen saturation. EXAM: PORTABLE CHEST 1 VIEW COMPARISON:  08/15/2019 FINDINGS: Cardiac enlargement. Patchy perihilar infiltrates in both lungs, possibly edema or multifocal pneumonia. No pleural effusions. No pneumothorax. Mediastinal contours appear intact. Calcification of the aorta. IMPRESSION: Cardiac enlargement with patchy perihilar infiltrates bilaterally suggesting edema or multifocal pneumonia. Electronically Signed   By: Lucienne Capers M.D.   On: 08/08/2020 05:19   ECHOCARDIOGRAM COMPLETE  Result Date: 08/09/2020    ECHOCARDIOGRAM REPORT   Patient Name:   Jenny Glover Date of Exam: 08/09/2020 Medical Rec #:  [id number]      Height:       70.0 in Accession #:    [id number]     Weight:       243.4 lb Date of Birth:  December 28, 1945     BSA:          2.269 m  Patient Age:    24 years       BP:           169/68 mmHg Patient Gender: F              HR:           54 bpm. Exam Location:  [facility] Procedure: 2D Echo, Cardiac Doppler and Color Doppler Indications:    CHF-Acute Diastolic 315.40 / G86.76  History:        Patient has prior history of Echocardiogram examinations, most                 recent 07/22/2018. CAD, Arrythmias:Atrial Fibrillation; Risk                 Factors:Hypertension, Diabetes and Dyslipidemia. Aortic                 stenosis, OSA on CPAP, ARF (acute renal failure).  Sonographer:    Alvino Chapel RCS Referring Phys: [phone] [place] IMPRESSIONS  1. Left ventricular ejection fraction, by estimation, is 60 to 65%. The left ventricle has normal function. The left ventricle has no regional wall motion abnormalities. There is mild concentric left ventricular hypertrophy. Left ventricular diastolic function could not be evaluated.  2. Right ventricular systolic function is normal. The right ventricular size is normal. There is severely elevated pulmonary artery systolic pressure.  3. Left atrial size was severely dilated.  4. The mitral valve is normal in structure. Mild mitral valve regurgitation. No evidence of mitral stenosis.  5. The aortic valve is tricuspid. There is moderate calcification of the aortic valve. There is severe thickening of the aortic valve. Aortic valve regurgitation is trivial. Severe aortic valve stenosis.  6. The inferior vena cava is dilated in size with <50% respiratory variability, suggesting right atrial pressure of 15 mmHg. Comparison(s): Prior images reviewed side by side. Aortic stenosis has worsened and is now severe. FINDINGS  Left Ventricle: Left ventricular ejection fraction, by estimation, is 60 to 65%. The left ventricle has normal function. The left ventricle has no regional wall motion abnormalities. The left ventricular internal  cavity size was normal in size. There is  mild concentric left ventricular hypertrophy.  Left ventricular diastolic function could not be evaluated due to atrial fibrillation. Left ventricular diastolic function could not be evaluated. Right Ventricle: The right ventricular size is normal. No increase in right ventricular wall thickness. Right ventricular systolic function is normal. There is severely elevated pulmonary artery systolic pressure. The tricuspid regurgitant velocity is 3.76 m/s, and with an assumed right atrial pressure of 15 mmHg, the estimated right ventricular systolic pressure is 22.0 mmHg. Left Atrium: Left atrial size was severely dilated. Right Atrium: Right atrial size was normal in size. Pericardium: There is no evidence of pericardial effusion. Mitral Valve: The mitral valve is normal in structure. Mild to moderate mitral annular calcification. Mild mitral valve regurgitation. No evidence of mitral valve stenosis. Tricuspid Valve: The tricuspid valve is normal in structure. Tricuspid valve regurgitation is trivial. Aortic Valve: The aortic valve is tricuspid. There is moderate calcification of the aortic valve. There is severe thickening of the aortic valve. Aortic valve regurgitation is trivial. Aortic regurgitation PHT measures 561 msec. Severe aortic stenosis is  present. Aortic valve mean gradient measures 34.8 mmHg. Aortic valve peak gradient measures 61.8 mmHg. Aortic valve area, by VTI measures 0.79 cm. Pulmonic Valve: The pulmonic valve was grossly normal. Pulmonic valve regurgitation is trivial. Aorta: The aortic root is normal in size and structure. Venous: The inferior vena cava is dilated in size with less than 50% respiratory variability, suggesting right atrial pressure of 15 mmHg. IAS/Shunts: No atrial level shunt detected by color flow Doppler.  LEFT VENTRICLE PLAX 2D LVIDd:         4.70 cm LVIDs:         2.50 cm LV PW:         1.30 cm LV IVS:        1.40 cm LVOT diam:     2.00 cm LV SV:         84 LV SV Index:   37 LVOT Area:     3.14 cm  RIGHT VENTRICLE RV S  prime:     13.80 cm/s TAPSE (M-mode): 2.0 cm LEFT ATRIUM              Index       RIGHT ATRIUM           Index LA diam:        4.30 cm  1.90 cm/m  RA Area:     25.80 cm LA Vol (A2C):   121.0 ml 53.33 ml/m RA Volume:   80.70 ml  35.57 ml/m LA Vol (A4C):   112.0 ml 49.36 ml/m LA Biplane Vol: 118.0 ml 52.01 ml/m  AORTIC VALVE AV Area (Vmax):    0.76 cm AV Area (Vmean):   0.85 cm AV Area (VTI):     0.79 cm AV Vmax:           393.00 cm/s AV Vmean:          260.726 cm/s AV VTI:            1.057 m AV Peak Grad:      61.8 mmHg AV Mean Grad:      34.8 mmHg LVOT Vmax:         94.60 cm/s LVOT Vmean:        70.900 cm/s LVOT VTI:          0.267 m LVOT/AV VTI ratio: 0.25 AI PHT:  561 msec  AORTA Ao Root diam: 3.80 cm MITRAL VALVE                TRICUSPID VALVE MV Area (PHT): 3.72 cm     TR Peak grad:   56.6 mmHg MV Decel Time: 204 msec     TR Vmax:        376.00 cm/s MV E velocity: 180.00 cm/s                             SHUNTS                             Systemic VTI:  0.27 m                             Systemic Diam: 2.00 cm Dani Gobble Croitoru MD Electronically signed by Sanda Klein MD Signature Date/Time: 08/09/2020/5:02:47 PM    Final     Orson Eva, DO  [facility]  If 7PM-7AM, please contact night-coverage [email or web address] Password [email or web address] 08/09/2020, 5:20 PM   LOS: 1 day

## 2020-08-09 NOTE — Progress Notes (Signed)
*  PRELIMINARY RESULTS* Echocardiogram 2D Echocardiogram has been performed.  Jenny Glover 08/09/2020, 11:12 AM

## 2020-08-09 NOTE — TOC Initial Note (Signed)
Transition of Care [facility]) - Initial/Assessment Note   Patient Details  Name: Jenny Glover MRN: [id number] Date of Birth: [birth year]  [facility] CM/SW Contact:    Sherie Don, LCSW Phone Number: 08/09/2020, 10:39 AM  Clinical Narrative: Patient is a 74 year old female who was admitted for acute respiratory failure. Patient has a history of hyperlipidemia, senile dementia, aortic stenosis, DM with peripheral vascular complication, hypertensive heart disease, and CAD.  TOC received consult for CHF screening. Screening completed with patient's husband, Reiley Bertagnolli, due to patient having dementia. Per husband, patient resides at home with him. Her current DME include a walker and BSC. Patient is not currently active with [facility] services, but husband reported patient had [facility] earlier this year but was discharged as patient was not cooperating with PT due to her dementia. Husband provides extensive assistance with patient's ADLs and sets up her medications for her.  Per CHF screening, husband reported the patient does not follow a heart healthy diet and does not restrict her salt intake. Husband stated the patient also does not restrict fluids and is not completing daily weights. TOC to follow for possible discharge needs.  Expected Discharge Plan: Home/Self Care Barriers to Discharge: Continued Medical Work up  Patient Goals and CMS Choice Patient states their goals for this hospitalization and ongoing recovery are:: Discharge home with husband CMS Medicare.gov Compare Post Acute Care list provided to:: Patient Represenative (must comment) Choice offered to / list presented to : Spouse  Expected Discharge Plan and Services Expected Discharge Plan: Home/Self Care In-house Referral: Clinical Social Work Discharge Planning Services: NA Post Acute Care Choice: NA Living arrangements for the past 2 months: Single Family Home             DME Arranged: N/A DME Agency: NA HH  Arranged: NA [place] Agency: NA  Prior Living Arrangements/Services Living arrangements for the past 2 months: [place] Lives with:: Spouse Patient language and need for interpreter reviewed:: Yes Do you feel safe going back to the place where you live?: Yes      Need for Family Participation in Patient Care: Yes (Comment) (Patient has dementia) Care giver support system in place?: Yes (comment) Current home services: DME [facility], [facility]) Criminal Activity/Legal Involvement Pertinent to Current Situation/Hospitalization: No - Comment as needed  Activities of Daily Living Home Assistive Devices/Equipment: Environmental consultant (specify type), Bedside commode/3-in-1, Eyeglasses ADL Screening (condition at time of admission) Patient's cognitive ability adequate to safely complete daily activities?: Yes Is the patient deaf or have difficulty hearing?: No Does the patient have difficulty seeing, even when wearing glasses/contacts?: No Does the patient have difficulty concentrating, remembering, or making decisions?: Yes Patient able to express need for assistance with ADLs?: Yes Does the patient have difficulty dressing or bathing?: No Independently performs ADLs?: Yes (appropriate for developmental age) Does the patient have difficulty walking or climbing stairs?: Yes Weakness of Legs: Both Weakness of Arms/Hands: None  Emotional Assessment Appearance:: Appears stated age Attitude/Demeanor/Rapport: Unable to Assess Affect (typically observed): Unable to Assess Orientation: : Oriented to Self Alcohol / Substance Use: Not Applicable Psych Involvement: No (comment)  Admission diagnosis:  Bronchospasm [J98.01] Hypoxia [W09.81] Acute diastolic CHF (congestive heart failure) (HCC) [I50.31] Acute respiratory failure with hypoxemia (HCC) [J96.01] Congestive heart failure, unspecified HF chronicity, unspecified heart failure type [facility]) [I50.9] Patient Active Problem List   Diagnosis Date Noted  .  Acute respiratory failure with hypoxemia ([place]) 08/08/2020  . Acute diastolic CHF (congestive heart failure) (  ) 08/08/2020  . Acute respiratory failure with hypoxia ([place]) 08/08/2020  . S/P right hip fracture   . Pressure injury of skin 08/17/2019  . Closed right hip fracture, initial encounter ([place]) 08/15/2019  . ARF (acute renal failure) ([place]) 08/15/2019  . Aortic stenosis 07/08/2018  . Senile dementia, uncomplicated ([place]) 26/69/1675  . OSA on CPAP 12/30/2015  . CAD (coronary artery disease) 08/23/2012  . Morbid obesity ([place])   . DM (diabetes mellitus) with peripheral vascular complication ([place]) 61/25/4832  . Hypertensive heart disease   . Chronic atrial fibrillation   . Hyperlipidemia    PCP:  Reynold Bowen, MD Pharmacy:   [facility] [address] [place] [address] Phone: [phone] Fax: [phone]  Readmission Risk Interventions No flowsheet data found.

## 2020-08-09 NOTE — Plan of Care (Signed)

## 2020-08-10 DIAGNOSIS — J9601 Acute respiratory failure with hypoxia: Secondary | ICD-10-CM | POA: Diagnosis not present

## 2020-08-10 DIAGNOSIS — J9801 Acute bronchospasm: Secondary | ICD-10-CM

## 2020-08-10 DIAGNOSIS — I5031 Acute diastolic (congestive) heart failure: Secondary | ICD-10-CM | POA: Diagnosis not present

## 2020-08-10 DIAGNOSIS — I482 Chronic atrial fibrillation, unspecified: Secondary | ICD-10-CM | POA: Diagnosis not present

## 2020-08-10 LAB — GLUCOSE, CAPILLARY
Glucose-Capillary: 243 mg/dL — ABNORMAL HIGH (ref 70–99)
Glucose-Capillary: 330 mg/dL — ABNORMAL HIGH (ref 70–99)
Glucose-Capillary: 205 mg/dL — ABNORMAL HIGH (ref 70–99)
Glucose-Capillary: 145 mg/dL — ABNORMAL HIGH (ref 70–99)

## 2020-08-10 LAB — BASIC METABOLIC PANEL
Anion gap: 9 (ref 5–15)
BUN: 38 mg/dL — ABNORMAL HIGH (ref 8–23)
CO2: 30 mmol/L (ref 22–32)
Calcium: 9.4 mg/dL (ref 8.9–10.3)
Chloride: 97 mmol/L — ABNORMAL LOW (ref 98–111)
Creatinine, Ser: 1.12 mg/dL — ABNORMAL HIGH (ref 0.44–1.00)
GFR, Estimated: 52 mL/min — ABNORMAL LOW (ref >60)
Glucose, Bld: 273 mg/dL — ABNORMAL HIGH (ref 70–99)
Potassium: 4.2 mmol/L (ref 3.5–5.1)
Sodium: 136 mmol/L (ref 135–145)

## 2020-08-10 LAB — MAGNESIUM: Magnesium: 2 mg/dL (ref 1.7–2.4)

## 2020-08-10 LAB — CBC
HCT: 40.8 % (ref 36.0–46.0)
Hemoglobin: 12.5 g/dL (ref 12.0–15.0)
MCH: 28.6 pg (ref 26.0–34.0)
MCHC: 30.6 g/dL (ref 30.0–36.0)
MCV: 93.4 fL (ref 80.0–100.0)
Platelets: 301 10*3/uL (ref 150–400)
RBC: 4.37 MIL/uL (ref 3.87–5.11)
RDW: 14.1 % (ref 11.5–15.5)
WBC: 13.9 10*3/uL — ABNORMAL HIGH (ref 4.0–10.5)
nRBC: 0 % (ref 0.0–0.2)

## 2020-08-10 MED ORDER — METOPROLOL SUCCINATE ER 25 MG PO TB24
25.0000 mg | ORAL_TABLET | Freq: Every day | ORAL | Status: DC
  Filled 2020-08-10: qty 1

## 2020-08-10 MED ORDER — INSULIN GLARGINE 100 UNIT/ML ~~LOC~~ SOLN
50.0000 [IU] | Freq: Every day | SUBCUTANEOUS | Status: DC
  Filled 2020-08-10 (×3): qty 0.5

## 2020-08-10 MED ORDER — INSULIN ASPART 100 UNIT/ML ~~LOC~~ SOLN
4.0000 [IU] | Freq: Three times a day (TID) | SUBCUTANEOUS | Status: DC
  Administered 2020-08-10 – 2020-08-11 (×3): 4 [IU] via SUBCUTANEOUS

## 2020-08-10 NOTE — Discharge Summary (Addendum)
Physician Discharge Summary  Jenny Glover TKZ:[id number] DOB: 13-Nov-1945 DOA: 08/08/2020  PCP: Reynold Bowen, MD  Admit date: 08/08/2020 Discharge date: 08/11/2020  Admitted From: Home Disposition:  Home   Recommendations for Outpatient Follow-up:  1. Follow up with PCP in 1-2 weeks 2. Please obtain BMP/CBC in one week 3. Outpatient referral to cardiology   Home Health: YES Equipment/Devices: HHPT  Discharge Condition: Stable CODE STATUS: FULL Diet recommendation: Heart Healthy / Carb Modified   Brief/Interim Summary: 74 y.o.femalewith medical history ofchronic atrial fibrillation status post DCCV 2006, diabetes mellitus type 2, hypertension, hyperlipidemia, dementia presenting with 1 day history of shortness of breath. The patient is a poor historian secondary to her dementia. Much of this history is obtained from review of the medical record and speaking with the patient's spouse. Apparently, the patient had been in her usual state of health until 08/07/2020 when she developed shortness of breath with a nonproductive cough. There is no reports of fevers, chills, chest pain, headache, hemoptysis, nausea, vomiting, diarrhea, abdominal pain, dysuria, hematuria. He states that her mental status has been at her usual baseline. She has been compliant with her medications. There is been no recent changes in her medications. Spouse does note that patient has chronic leg edema which has not significantly changed. In the emergency department, the patient was afebrile and hemodynamically stable with oxygen saturation 100% on nonrebreather. When she initially presented, she had oxygen saturation of 55% on room air. BMP was essentially unremarkable with her serum creatinine of 1.22 which is her usual baseline. WBC 17.2, hemoglobin 10.0, platelet 249,000. The patient was given IV furosemide. Chest x-ray showed bilateral perihilar infiltrates. BNP was 418. Lactic acid 1.8. PCT<0.10. She was  given IV lasix and IV solumedrol.  She was continued on IV lasix with good clinical effect   Discharge Diagnoses:  Acute respiratory failure with hypoxia -Secondary to pulmonary edema -Currently stable on nonrebreather>>>RA -Wean as tolerated for saturation greater 92% -Chestxray shows bilateral perihilar infiltrates -11/28 personally reviewed CXR--improved infiltrates  Acute diastolic CHF -Continue IV furosemide>>d/c home with lasix 40 mg po daily -Continue metoprolol succinate--held 11/26 due to bradycardia -restart lower dose metoprolol due to bradycardia in 50s -07/22/2018 echo EF 60 to 65%, moderate AS, mild MR, no WMA, PASP 56 -EchoEF 60-65%, severe AS,VTI measures 0.79 cm. -Accurate I's and O's--NEG 4.5 L -Daily weights  UTI--EColi -UA>50WBC -continue ceftriaxone pending culture -d/c home with cefdinir x 4 more days  Aortic Stenosis -severe AS,VTI measures 0.79 cm. -outpt cardiology followup  Diabetes mellitus type 2 -Hemoglobin A1c--6.4 -Increase Lantus to 50 units during hospitalization -NovoLog sliding scale -added novolog 4 units with meals during hospitalization -CBGs elevated in part due to steroids  CKD stage IIIb -Baseline creatinine 1.2-1.5 -Monitor with diuresis  Leukocytosis -due to PNA and UTI -PCT<0.10-->2.06  Chronic atrial fibrillation -Rate controlled -Continue metoprolol succinate -Continue rivaroxaban  Essential hypertension -Continue metoprolol succinateat lower dose -restart amlodpine  Hyperlipidemia -Continue statin  Dementia without behavioral disturbance -Continue Namenda   Discharge Instructions  Discharge Instructions    Ambulatory referral to Cardiology   Complete by: As directed    CHF with severe Aortic Stenosis     Allergies as of 08/11/2020   No Known Allergies     Medication List    TAKE these medications   acetaminophen 500 MG tablet Commonly known as: TYLENOL Take 1 tablet (500 mg  total) by mouth every 6 (six) hours as needed (pain).   amLODipine 5 MG tablet Commonly known  as: NORVASC Take 5 mg by mouth daily.   atorvastatin 80 MG tablet Commonly known as: LIPITOR Take 1 tablet (80 mg total) by mouth daily at 6 PM.   cefdinir 300 MG capsule Commonly known as: OMNICEF Take 1 capsule (300 mg total) by mouth every 12 (twelve) hours.   cholecalciferol 1000 units tablet Commonly known as: VITAMIN D Take 2,000 Units by mouth daily.   escitalopram 20 MG tablet Commonly known as: LEXAPRO Take 20 mg by mouth daily.   furosemide 40 MG tablet Commonly known as: LASIX Take 1 tablet (40 mg total) by mouth daily. Start taking on: August 12, 2020   insulin glargine 100 UNIT/ML injection Commonly known as: LANTUS Inject 0.5 mLs (50 Units total) into the skin daily. Breakfast.   insulin lispro 100 UNIT/ML injection Commonly known as: HUMALOG Inject 10 Units into the skin 3 (three) times daily before meals.   memantine 5 MG tablet Commonly known as: NAMENDA Take 5 mg 2 (two) times daily by mouth.   metoprolol succinate 50 MG 24 hr tablet Commonly known as: TOPROL-XL Take 1 tablet (50 mg total) by mouth daily. Start taking on: August 12, 2020 What changed:   medication strength  how much to take  additional instructions   rivaroxaban 20 MG Tabs tablet Commonly known as: XARELTO Take 1 tablet (20 mg total) by mouth daily.       Follow-up Information    [facility] Follow up.   Specialty: Home Health Services Why: HHPT Contact information: [address] [place] [address] [phone]              No Known Allergies  Consultations:  none   Procedures/Studies: DG CHEST PORT 1 VIEW  Result Date: 08/11/2020 CLINICAL DATA:  Shortness of breath. EXAM: PORTABLE CHEST 1 VIEW COMPARISON:  August 08, 2020. FINDINGS: Enlarged cardiac silhouette. Both lungs are clear. No visible pleural effusions or  pneumothorax. The visualized skeletal structures are unremarkable. IMPRESSION: No acute cardiopulmonary disease. Electronically Signed   By: Margaretha Sheffield MD   On: 08/11/2020 06:41   DG Chest Port 1 View  Result Date: 08/08/2020 CLINICAL DATA:  Shortness of breath and hypoxia. Decreased oxygen saturation. EXAM: PORTABLE CHEST 1 VIEW COMPARISON:  08/15/2019 FINDINGS: Cardiac enlargement. Patchy perihilar infiltrates in both lungs, possibly edema or multifocal pneumonia. No pleural effusions. No pneumothorax. Mediastinal contours appear intact. Calcification of the aorta. IMPRESSION: Cardiac enlargement with patchy perihilar infiltrates bilaterally suggesting edema or multifocal pneumonia. Electronically Signed   By: Lucienne Capers M.D.   On: 08/08/2020 05:19   ECHOCARDIOGRAM COMPLETE  Result Date: 08/09/2020    ECHOCARDIOGRAM REPORT   Patient Name:   Jenny Glover Date of Exam: 08/09/2020 Medical Rec #:  [id number]      Height:       70.0 in Accession #:    [id number]     Weight:       243.4 lb Date of Birth:  01/31/1946     BSA:          2.269 m Patient Age:    30 years       BP:           169/68 mmHg Patient Gender: F              HR:           54 bpm. Exam Location:  [facility] Procedure: 2D Echo, Cardiac Doppler and Color Doppler Indications:  CHF-Acute Diastolic 376.28 / B15.17  History:        Patient has prior history of Echocardiogram examinations, most                 recent 07/22/2018. CAD, Arrythmias:Atrial Fibrillation; Risk                 Factors:Hypertension, Diabetes and Dyslipidemia. Aortic                 stenosis, OSA on CPAP, ARF (acute renal failure).  Sonographer:    Alvino Chapel RCS Referring Phys: [phone] Reign Dziuba IMPRESSIONS  1. Left ventricular ejection fraction, by estimation, is 60 to 65%. The left ventricle has normal function. The left ventricle has no regional wall motion abnormalities. There is mild concentric left ventricular hypertrophy. Left ventricular diastolic  function could not be evaluated.  2. Right ventricular systolic function is normal. The right ventricular size is normal. There is severely elevated pulmonary artery systolic pressure.  3. Left atrial size was severely dilated.  4. The mitral valve is normal in structure. Mild mitral valve regurgitation. No evidence of mitral stenosis.  5. The aortic valve is tricuspid. There is moderate calcification of the aortic valve. There is severe thickening of the aortic valve. Aortic valve regurgitation is trivial. Severe aortic valve stenosis.  6. The inferior vena cava is dilated in size with <50% respiratory variability, suggesting right atrial pressure of 15 mmHg. Comparison(s): Prior images reviewed side by side. Aortic stenosis has worsened and is now severe. FINDINGS  Left Ventricle: Left ventricular ejection fraction, by estimation, is 60 to 65%. The left ventricle has normal function. The left ventricle has no regional wall motion abnormalities. The left ventricular internal cavity size was normal in size. There is  mild concentric left ventricular hypertrophy. Left ventricular diastolic function could not be evaluated due to atrial fibrillation. Left ventricular diastolic function could not be evaluated. Right Ventricle: The right ventricular size is normal. No increase in right ventricular wall thickness. Right ventricular systolic function is normal. There is severely elevated pulmonary artery systolic pressure. The tricuspid regurgitant velocity is 3.76 m/s, and with an assumed right atrial pressure of 15 mmHg, the estimated right ventricular systolic pressure is 73.7 mmHg. Left Atrium: Left atrial size was severely dilated. Right Atrium: Right atrial size was normal in size. Pericardium: There is no evidence of pericardial effusion. Mitral Valve: The mitral valve is normal in structure. Mild to moderate mitral annular calcification. Mild mitral valve regurgitation. No evidence of mitral valve stenosis.  Tricuspid Valve: The tricuspid valve is normal in structure. Tricuspid valve regurgitation is trivial. Aortic Valve: The aortic valve is tricuspid. There is moderate calcification of the aortic valve. There is severe thickening of the aortic valve. Aortic valve regurgitation is trivial. Aortic regurgitation PHT measures 561 msec. Severe aortic stenosis is  present. Aortic valve mean gradient measures 34.8 mmHg. Aortic valve peak gradient measures 61.8 mmHg. Aortic valve area, by VTI measures 0.79 cm. Pulmonic Valve: The pulmonic valve was grossly normal. Pulmonic valve regurgitation is trivial. Aorta: The aortic root is normal in size and structure. Venous: The inferior vena cava is dilated in size with less than 50% respiratory variability, suggesting right atrial pressure of 15 mmHg. IAS/Shunts: No atrial level shunt detected by color flow Doppler.  LEFT VENTRICLE PLAX 2D LVIDd:         4.70 cm LVIDs:         2.50 cm LV PW:  1.30 cm LV IVS:        1.40 cm LVOT diam:     2.00 cm LV SV:         84 LV SV Index:   37 LVOT Area:     3.14 cm  RIGHT VENTRICLE RV S prime:     13.80 cm/s TAPSE (M-mode): 2.0 cm LEFT ATRIUM              Index       RIGHT ATRIUM           Index LA diam:        4.30 cm  1.90 cm/m  RA Area:     25.80 cm LA Vol (A2C):   121.0 ml 53.33 ml/m RA Volume:   80.70 ml  35.57 ml/m LA Vol (A4C):   112.0 ml 49.36 ml/m LA Biplane Vol: 118.0 ml 52.01 ml/m  AORTIC VALVE AV Area (Vmax):    0.76 cm AV Area (Vmean):   0.85 cm AV Area (VTI):     0.79 cm AV Vmax:           393.00 cm/s AV Vmean:          260.726 cm/s AV VTI:            1.057 m AV Peak Grad:      61.8 mmHg AV Mean Grad:      34.8 mmHg LVOT Vmax:         94.60 cm/s LVOT Vmean:        70.900 cm/s LVOT VTI:          0.267 m LVOT/AV VTI ratio: 0.25 AI PHT:            561 msec  AORTA Ao Root diam: 3.80 cm MITRAL VALVE                TRICUSPID VALVE MV Area (PHT): 3.72 cm     TR Peak grad:   56.6 mmHg MV Decel Time: 204 msec     TR  Vmax:        376.00 cm/s MV E velocity: 180.00 cm/s                             SHUNTS                             Systemic VTI:  0.27 m                             Systemic Diam: 2.00 cm Dani Gobble Croitoru MD Electronically signed by Sanda Klein MD Signature Date/Time: 08/09/2020/5:02:47 PM    Final         Discharge Exam: Vitals:   08/11/20 0442 08/11/20 0900  BP: (!) 168/53 (!) 160/63  Pulse: (!) 49 (!) 55  Resp: 18 20  Temp: 98.1 F (36.7 C)   SpO2: 99%    Vitals:   08/10/20 2053 08/11/20 0442 08/11/20 0644 08/11/20 0900  BP: (!) 144/59 (!) 168/53  (!) 160/63  Pulse: 83 (!) 49  (!) 55  Resp: 20 18  20   Temp: 97.7 F (36.5 C) 98.1 F (36.7 C)    TempSrc:      SpO2: 96% 99%    Weight:   109.5 kg   Height:        General: Pt is  alert, awake, not in acute distress Cardiovascular: IRRR, S1/S2 +, no rubs, no gallops Respiratory: CTA bilaterally, no wheezing, no rhonchi Abdominal: Soft, NT, ND, bowel sounds + Extremities: no edema, no cyanosis   The results of significant diagnostics from this hospitalization (including imaging, microbiology, ancillary and laboratory) are listed below for reference.    Significant Diagnostic Studies: DG CHEST PORT 1 VIEW  Result Date: 08/11/2020 CLINICAL DATA:  Shortness of breath. EXAM: PORTABLE CHEST 1 VIEW COMPARISON:  August 08, 2020. FINDINGS: Enlarged cardiac silhouette. Both lungs are clear. No visible pleural effusions or pneumothorax. The visualized skeletal structures are unremarkable. IMPRESSION: No acute cardiopulmonary disease. Electronically Signed   By: Margaretha Sheffield MD   On: 08/11/2020 06:41   DG Chest Port 1 View  Result Date: 08/08/2020 CLINICAL DATA:  Shortness of breath and hypoxia. Decreased oxygen saturation. EXAM: PORTABLE CHEST 1 VIEW COMPARISON:  08/15/2019 FINDINGS: Cardiac enlargement. Patchy perihilar infiltrates in both lungs, possibly edema or multifocal pneumonia. No pleural effusions. No pneumothorax.  Mediastinal contours appear intact. Calcification of the aorta. IMPRESSION: Cardiac enlargement with patchy perihilar infiltrates bilaterally suggesting edema or multifocal pneumonia. Electronically Signed   By: Lucienne Capers M.D.   On: 08/08/2020 05:19   ECHOCARDIOGRAM COMPLETE  Result Date: 08/09/2020    ECHOCARDIOGRAM REPORT   Patient Name:   Jenny Glover Date of Exam: 08/09/2020 Medical Rec #:  [id number]      Height:       70.0 in Accession #:    [id number]     Weight:       243.4 lb Date of Birth:  01-Oct-1945     BSA:          2.269 m Patient Age:    75 years       BP:           169/68 mmHg Patient Gender: F              HR:           54 bpm. Exam Location:  [facility] Procedure: 2D Echo, Cardiac Doppler and Color Doppler Indications:    CHF-Acute Diastolic 431.54 / M08.67  History:        Patient has prior history of Echocardiogram examinations, most                 recent 07/22/2018. CAD, Arrythmias:Atrial Fibrillation; Risk                 Factors:Hypertension, Diabetes and Dyslipidemia. Aortic                 stenosis, OSA on CPAP, ARF (acute renal failure).  Sonographer:    Alvino Chapel RCS Referring Phys: [phone] Dre Gamino IMPRESSIONS  1. Left ventricular ejection fraction, by estimation, is 60 to 65%. The left ventricle has normal function. The left ventricle has no regional wall motion abnormalities. There is mild concentric left ventricular hypertrophy. Left ventricular diastolic function could not be evaluated.  2. Right ventricular systolic function is normal. The right ventricular size is normal. There is severely elevated pulmonary artery systolic pressure.  3. Left atrial size was severely dilated.  4. The mitral valve is normal in structure. Mild mitral valve regurgitation. No evidence of mitral stenosis.  5. The aortic valve is tricuspid. There is moderate calcification of the aortic valve. There is severe thickening of the aortic valve. Aortic valve regurgitation is trivial. Severe  aortic valve stenosis.  6. The inferior vena cava is dilated  in size with <50% respiratory variability, suggesting right atrial pressure of 15 mmHg. Comparison(s): Prior images reviewed side by side. Aortic stenosis has worsened and is now severe. FINDINGS  Left Ventricle: Left ventricular ejection fraction, by estimation, is 60 to 65%. The left ventricle has normal function. The left ventricle has no regional wall motion abnormalities. The left ventricular internal cavity size was normal in size. There is  mild concentric left ventricular hypertrophy. Left ventricular diastolic function could not be evaluated due to atrial fibrillation. Left ventricular diastolic function could not be evaluated. Right Ventricle: The right ventricular size is normal. No increase in right ventricular wall thickness. Right ventricular systolic function is normal. There is severely elevated pulmonary artery systolic pressure. The tricuspid regurgitant velocity is 3.76 m/s, and with an assumed right atrial pressure of 15 mmHg, the estimated right ventricular systolic pressure is 37.1 mmHg. Left Atrium: Left atrial size was severely dilated. Right Atrium: Right atrial size was normal in size. Pericardium: There is no evidence of pericardial effusion. Mitral Valve: The mitral valve is normal in structure. Mild to moderate mitral annular calcification. Mild mitral valve regurgitation. No evidence of mitral valve stenosis. Tricuspid Valve: The tricuspid valve is normal in structure. Tricuspid valve regurgitation is trivial. Aortic Valve: The aortic valve is tricuspid. There is moderate calcification of the aortic valve. There is severe thickening of the aortic valve. Aortic valve regurgitation is trivial. Aortic regurgitation PHT measures 561 msec. Severe aortic stenosis is  present. Aortic valve mean gradient measures 34.8 mmHg. Aortic valve peak gradient measures 61.8 mmHg. Aortic valve area, by VTI measures 0.79 cm. Pulmonic Valve: The  pulmonic valve was grossly normal. Pulmonic valve regurgitation is trivial. Aorta: The aortic root is normal in size and structure. Venous: The inferior vena cava is dilated in size with less than 50% respiratory variability, suggesting right atrial pressure of 15 mmHg. IAS/Shunts: No atrial level shunt detected by color flow Doppler.  LEFT VENTRICLE PLAX 2D LVIDd:         4.70 cm LVIDs:         2.50 cm LV PW:         1.30 cm LV IVS:        1.40 cm LVOT diam:     2.00 cm LV SV:         84 LV SV Index:   37 LVOT Area:     3.14 cm  RIGHT VENTRICLE RV S prime:     13.80 cm/s TAPSE (M-mode): 2.0 cm LEFT ATRIUM              Index       RIGHT ATRIUM           Index LA diam:        4.30 cm  1.90 cm/m  RA Area:     25.80 cm LA Vol (A2C):   121.0 ml 53.33 ml/m RA Volume:   80.70 ml  35.57 ml/m LA Vol (A4C):   112.0 ml 49.36 ml/m LA Biplane Vol: 118.0 ml 52.01 ml/m  AORTIC VALVE AV Area (Vmax):    0.76 cm AV Area (Vmean):   0.85 cm AV Area (VTI):     0.79 cm AV Vmax:           393.00 cm/s AV Vmean:          260.726 cm/s AV VTI:            1.057 m AV Peak Grad:  61.8 mmHg AV Mean Grad:      34.8 mmHg LVOT Vmax:         94.60 cm/s LVOT Vmean:        70.900 cm/s LVOT VTI:          0.267 m LVOT/AV VTI ratio: 0.25 AI PHT:            561 msec  AORTA Ao Root diam: 3.80 cm MITRAL VALVE                TRICUSPID VALVE MV Area (PHT): 3.72 cm     TR Peak grad:   56.6 mmHg MV Decel Time: 204 msec     TR Vmax:        376.00 cm/s MV E velocity: 180.00 cm/s                             SHUNTS                             Systemic VTI:  0.27 m                             Systemic Diam: 2.00 cm Dani Gobble Croitoru MD Electronically signed by Sanda Klein MD Signature Date/Time: 08/09/2020/5:02:47 PM    Final      Microbiology: Recent Results (from the past 240 hour(s))  Resp Panel by RT-PCR (Flu A&B, Covid) Nasopharyngeal Swab     Status: None   Collection Time: 08/08/20  5:00 AM   Specimen: Nasopharyngeal Swab;  Nasopharyngeal(NP) swabs in vial transport medium  Result Value Ref Range Status   SARS Coronavirus 2 by RT PCR NEGATIVE NEGATIVE Final    Comment: (NOTE) SARS-CoV-2 target nucleic acids are NOT DETECTED.  The SARS-CoV-2 RNA is generally detectable in upper respiratory specimens during the acute phase of infection. The lowest concentration of SARS-CoV-2 viral copies this assay can detect is 138 copies/mL. A negative result does not preclude SARS-Cov-2 infection and should not be used as the sole basis for treatment or other patient management decisions. A negative result may occur with  improper specimen collection/handling, submission of specimen other than nasopharyngeal swab, presence of viral mutation(s) within the areas targeted by this assay, and inadequate number of viral copies(<138 copies/mL). A negative result must be combined with clinical observations, patient history, and epidemiological information. The expected result is Negative.  Fact Sheet for Patients:  EntrepreneurPulse.com.au  Fact Sheet for Healthcare Providers:  IncredibleEmployment.be  This test is no t yet approved or cleared by the [place] FDA and  has been authorized for detection and/or diagnosis of SARS-CoV-2 by FDA under an Emergency Use Authorization (EUA). This EUA will remain  in effect (meaning this test can be used) for the duration of the COVID-19 declaration under Section 564(b)(1) of the Act, 21 U.S.C.section 360bbb-3(b)(1), unless the authorization is terminated  or revoked sooner.       Influenza A by PCR NEGATIVE NEGATIVE Final   Influenza B by PCR NEGATIVE NEGATIVE Final    Comment: (NOTE) The Xpert Xpress SARS-CoV-2/FLU/RSV plus assay is intended as an aid in the diagnosis of influenza from Nasopharyngeal swab specimens and should not be used as a sole basis for treatment. Nasal washings and aspirates are unacceptable for Xpert Xpress  SARS-CoV-2/FLU/RSV testing.  Fact Sheet for Patients: EntrepreneurPulse.com.au  Fact Sheet for Healthcare  Providers: IncredibleEmployment.be  This test is not yet approved or cleared by the [place] and has been authorized for detection and/or diagnosis of SARS-CoV-2 by FDA under an Emergency Use Authorization (EUA). This EUA will remain in effect (meaning this test can be used) for the duration of the COVID-19 declaration under Section 564(b)(1) of the Act, 21 U.S.C. section 360bbb-3(b)(1), unless the authorization is terminated or revoked.  Performed at [facility], [address], [place], [place] [address]   Blood Culture (routine x 2)     Status: None (Preliminary result)   Collection Time: 08/08/20  5:30 AM   Specimen: BLOOD RIGHT HAND  Result Value Ref Range Status   Specimen Description BLOOD RIGHT HAND  Final   Special Requests   Final    BOTTLES DRAWN AEROBIC AND ANAEROBIC Blood Culture results may not be optimal due to an inadequate volume of blood received in culture bottles   Culture   Final    NO GROWTH 3 DAYS Performed at [facility], [address], [place], [place] [address]    Report Status PENDING  Incomplete  Blood Culture (routine x 2)     Status: None (Preliminary result)   Collection Time: 08/08/20  5:47 AM   Specimen: BLOOD LEFT HAND  Result Value Ref Range Status   Specimen Description BLOOD LEFT HAND  Final   Special Requests   Final    BOTTLES DRAWN AEROBIC ONLY Blood Culture results may not be optimal due to an inadequate volume of blood received in culture bottles   Culture   Final    NO GROWTH 3 DAYS Performed at [facility], [address], [place], [place] [address]    Report Status PENDING  Incomplete  Culture, Urine     Status: Abnormal   Collection Time: 08/08/20 11:06 PM   Specimen: Urine, Clean Catch  Result Value Ref Range Status   Specimen Description   Final    URINE, CLEAN  CATCH Performed at [facility], [address] [place], [place] [address]    Special Requests   Final    NONE Performed at [facility], [address], [place], [place] [address]    Culture >=100,000 COLONIES/mL ESCHERICHIA COLI (A)  Final   Report Status 08/11/2020 FINAL  Final   Organism ID, Bacteria ESCHERICHIA COLI (A)  Final      Susceptibility   Escherichia coli - MIC*    AMPICILLIN <=2 SENSITIVE Sensitive     CEFAZOLIN <=4 SENSITIVE Sensitive     CEFEPIME <=0.12 SENSITIVE Sensitive     CEFTRIAXONE <=0.25 SENSITIVE Sensitive     CIPROFLOXACIN <=0.25 SENSITIVE Sensitive     GENTAMICIN <=1 SENSITIVE Sensitive     IMIPENEM <=0.25 SENSITIVE Sensitive     NITROFURANTOIN <=16 SENSITIVE Sensitive     TRIMETH/SULFA >=320 RESISTANT Resistant     AMPICILLIN/SULBACTAM <=2 SENSITIVE Sensitive     PIP/TAZO <=4 SENSITIVE Sensitive     * >=100,000 COLONIES/mL ESCHERICHIA COLI     Labs: Basic Metabolic Panel: Recent Labs  Lab 08/08/20 0500 08/08/20 0500 08/09/20 0210 08/09/20 0210 08/10/20 0802 08/11/20 0744  NA 138  --  138  --  136 138  K 4.5   < > 4.6   < > 4.2 3.8  CL 105  --  102  --  97* 98  CO2 24  --  28  --  30 30  GLUCOSE 255*  --  277*  --  273* 198*  BUN 32*  --  38*  --  38* 41*  CREATININE 1.22*  --  1.13*  --  1.12* 1.11*  CALCIUM 8.6*  --  9.1  --  9.4 9.2  MG  --   --  2.1  --  2.0 1.8   < > = values in this interval not displayed.   Liver Function Tests: Recent Labs  Lab 08/08/20 0500  AST 18  ALT 13  ALKPHOS 122  BILITOT 0.5  PROT 7.3  ALBUMIN 3.2*   No results for input(s): LIPASE, AMYLASE in the last 168 hours. No results for input(s): AMMONIA in the last 168 hours. CBC: Recent Labs  Lab 08/08/20 0500 08/09/20 0210 08/10/20 0802 08/11/20 0744  WBC 17.2* 11.6* 13.9* 10.1  NEUTROABS 14.0*  --   --   --   HGB 11.0* 10.3* 12.5 12.7  HCT 37.1 32.8* 40.8 41.5  MCV 97.4 94.3 93.4 93.0  PLT 249 251 301 306   Cardiac Enzymes: No results  for input(s): CKTOTAL, CKMB, CKMBINDEX, TROPONINI in the last 168 hours. BNP: Invalid input(s): POCBNP CBG: Recent Labs  Lab 08/10/20 1106 08/10/20 1626 08/10/20 2124 08/11/20 0738 08/11/20 1127  GLUCAP 330* 205* 145* 190* 196*    Time coordinating discharge:  36 minutes  Signed:  Orson Eva, DO [facility] Pager: [phone] 08/11/2020, 2:21 PM

## 2020-08-10 NOTE — Progress Notes (Signed)
PROGRESS NOTE  Jenny Glover TKW:[id number] DOB: [birth year] DOA: 08/08/2020 PCP: Reynold Bowen, MD  Brief History:  74 y.o.femalewith medical history ofchronic atrial fibrillation status post DCCV 2006, diabetes mellitus type 2, hypertension, hyperlipidemia, dementia presenting with 1 day history of shortness of breath. The patient is a poor historian secondary to her dementia. Much of this history is obtained from review of the medical record and speaking with the patient's spouse. Apparently, the patient had been in her usual state of health until 08/07/2020 when she developed shortness of breath with a nonproductive cough. There is no reports of fevers, chills, chest pain, headache, hemoptysis, nausea, vomiting, diarrhea, abdominal pain, dysuria, hematuria. He states that her mental status has been at her usual baseline. She has been compliant with her medications. There is been no recent changes in her medications. Spouse does note that patient has chronic leg edema which has not significantly changed. In the emergency department, the patient was afebrile and hemodynamically stable with oxygen saturation 100% on nonrebreather. When she initially presented, she had oxygen saturation of 55% on room air. BMP was essentially unremarkable with her serum creatinine of 1.22 which is her usual baseline. WBC 17.2, hemoglobin 10.0, platelet 249,000. The patient was given IV furosemide. Chest x-ray showed bilateral perihilar infiltrates. BNP was 418. Lactic acid 1.8. PCT<0.10. She was given IV lasix and IV solumedrol  Assessment/Plan: Acute respiratory failure with hypoxia -Secondary to pulmonary edema -Currently stable on nonrebreather>>>RA -Wean as tolerated for saturation greater 92% -Chestxray shows bilateral perihilar infiltrates  Acute diastolic CHF -Continue IV furosemide -Continue metoprolol succinate--held 11/26 due to bradycardia -plan restart lower dose 11/27 -07/22/2018  echo EF 60 to 65%, moderate AS, mild MR, no WMA, PASP 56 -Echo EF 60-65%, severe AS, VTI measures 0.79 cm.  -Accurate I's and O's--NEG 3.3 L -Daily weights  Lobar PNA -continue ceftriaxone and azithro -PCT <0.10-->2.06 -repeat CXR 11/28 am to clarify if edema vs consolidation  UTI--EColi -UA>50WBC -start ceftriaxone pending culture  Aortic Stenosis -severe AS, VTI measures 0.79 cm.  -outpt cardiology followup  Diabetes mellitus type 2 -Hemoglobin A1c--6.4 -Increase Lantus -NovoLog sliding scale -add novolog 4 units with meals -CBGs elevated in part due to steroids  CKD stage IIIb -Baseline creatinine 1.2-1.5 -Monitor with diuresis  Leukocytosis -due to PNA and UTI -PCT<0.10-->2.06  Chronic atrial fibrillation -Rate controlled -Continue metoprolol succinate -Continue rivaroxaban  Essential hypertension -Continue metoprolol succinate at lower dose -restart amlodpine  Hyperlipidemia -Continue statin  Dementia without behavioral disturbance -Continue Namenda       Status is: Inpatient  Remains inpatient appropriate because:IV treatments appropriate due to intensity of illness or inability to take PO   Dispo: The patient is from: Home  Anticipated d/c is to: Home  Anticipated d/c date is: 2 days  Patient currently is not medically stable to d/c.        Family Communication:   Daughter updated at bedside 11/27  Consultants:  none  Code Status:  FULL  DVT Prophylaxis:  Xarelto   Procedures: As Listed in Progress Note Above  Antibiotics: Ceftriaxone 11/26>> azithro 11/26>>    Subjective: Patient denies fevers, chills, headache, chest pain, dyspnea, nausea, vomiting, diarrhea, abdominal pain, dysuria, hematuria, hematochezia, and melena.   Objective: Vitals:   08/09/20 1331 08/10/20 0301 08/10/20 0508 08/10/20 1333  BP: (!) 163/75  (!) 156/62 (!) 116/99  Pulse:  (!) 58  (!) 56 (!) 55  Resp: 18  16 20  Temp: 98 F (36.7 C)  97.7 F (36.5 C) 98.2 F (36.8 C)  TempSrc: Oral   Oral  SpO2: 100%  94% 100%  Weight:  110.5 kg    Height:        Intake/Output Summary (Last 24 hours) at 08/10/2020 1706 Last data filed at 08/10/2020 1000 Gross per 24 hour  Intake 481 ml  Output 700 ml  Net -219 ml   Weight change: -1.1 kg Exam:   General:  Pt is alert, follows commands appropriately, not in acute distress  HEENT: No icterus, No thrush, No neck mass, [place]/AT  Cardiovascular: RRR, S1/S2, no rubs, no gallops  Respiratory: bibasilar crackles. No wheeze  Abdomen: Soft/+BS, non tender, non distended, no guarding  Extremities: trace LE edema, No lymphangitis, No petechiae, No rashes, no synovitis   Data Reviewed: I have personally reviewed following labs and imaging studies Basic Metabolic Panel: Recent Labs  Lab 08/08/20 0500 08/09/20 0210 08/10/20 0802  NA 138 138 136  K 4.5 4.6 4.2  CL 105 102 97*  CO2 24 28 30   GLUCOSE 255* 277* 273*  BUN 32* 38* 38*  CREATININE 1.22* 1.13* 1.12*  CALCIUM 8.6* 9.1 9.4  MG  --  2.1 2.0   Liver Function Tests: Recent Labs  Lab 08/08/20 0500  AST 18  ALT 13  ALKPHOS 122  BILITOT 0.5  PROT 7.3  ALBUMIN 3.2*   No results for input(s): LIPASE, AMYLASE in the last 168 hours. No results for input(s): AMMONIA in the last 168 hours. Coagulation Profile: No results for input(s): INR, PROTIME in the last 168 hours. CBC: Recent Labs  Lab 08/08/20 0500 08/09/20 0210 08/10/20 0802  WBC 17.2* 11.6* 13.9*  NEUTROABS 14.0*  --   --   HGB 11.0* 10.3* 12.5  HCT 37.1 32.8* 40.8  MCV 97.4 94.3 93.4  PLT 249 251 301   Cardiac Enzymes: No results for input(s): CKTOTAL, CKMB, CKMBINDEX, TROPONINI in the last 168 hours. BNP: Invalid input(s): POCBNP CBG: Recent Labs  Lab 08/09/20 1756 08/09/20 2152 08/10/20 0810 08/10/20 1106 08/10/20 1626  GLUCAP 463* 264* 243* 330* 205*   HbA1C: Recent  Labs    08/09/20 0210  HGBA1C 6.4*   Urine analysis:    Component Value Date/Time   COLORURINE YELLOW 08/08/2020 2306   APPEARANCEUR CLOUDY (A) 08/08/2020 2306   LABSPEC 1.021 08/08/2020 2306   PHURINE 6.0 08/08/2020 2306   GLUCOSEU 50 (A) 08/08/2020 2306   HGBUR MODERATE (A) 08/08/2020 2306   BILIRUBINUR NEGATIVE 08/08/2020 2306   KETONESUR NEGATIVE 08/08/2020 2306   PROTEINUR 100 (A) 08/08/2020 2306   UROBILINOGEN 0.2 02/15/2015 1341   NITRITE NEGATIVE 08/08/2020 2306   LEUKOCYTESUR LARGE (A) 08/08/2020 2306   Sepsis Labs: @LABRCNTIP (procalcitonin:4,lacticidven:4) ) Recent Results (from the past 240 hour(s))  Resp Panel by RT-PCR (Flu A&B, Covid) Nasopharyngeal Swab     Status: None   Collection Time: 08/08/20  5:00 AM   Specimen: Nasopharyngeal Swab; Nasopharyngeal(NP) swabs in vial transport medium  Result Value Ref Range Status   SARS Coronavirus 2 by RT PCR NEGATIVE NEGATIVE Final    Comment: (NOTE) SARS-CoV-2 target nucleic acids are NOT DETECTED.  The SARS-CoV-2 RNA is generally detectable in upper respiratory specimens during the acute phase of infection. The lowest concentration of SARS-CoV-2 viral copies this assay can detect is 138 copies/mL. A negative result does not preclude SARS-Cov-2 infection and should not be used as the sole basis for treatment or other patient management decisions. A negative  result may occur with  improper specimen collection/handling, submission of specimen other than nasopharyngeal swab, presence of viral mutation(s) within the areas targeted by this assay, and inadequate number of viral copies(<138 copies/mL). A negative result must be combined with clinical observations, patient history, and epidemiological information. The expected result is Negative.  Fact Sheet for Patients:  EntrepreneurPulse.com.au  Fact Sheet for Healthcare Providers:  IncredibleEmployment.be  This test is no t yet  approved or cleared by the [place] FDA and  has been authorized for detection and/or diagnosis of SARS-CoV-2 by FDA under an Emergency Use Authorization (EUA). This EUA will remain  in effect (meaning this test can be used) for the duration of the COVID-19 declaration under Section 564(b)(1) of the Act, 21 U.S.C.section 360bbb-3(b)(1), unless the authorization is terminated  or revoked sooner.       Influenza A by PCR NEGATIVE NEGATIVE Final   Influenza B by PCR NEGATIVE NEGATIVE Final    Comment: (NOTE) The Xpert Xpress SARS-CoV-2/FLU/RSV plus assay is intended as an aid in the diagnosis of influenza from Nasopharyngeal swab specimens and should not be used as a sole basis for treatment. Nasal washings and aspirates are unacceptable for Xpert Xpress SARS-CoV-2/FLU/RSV testing.  Fact Sheet for Patients: EntrepreneurPulse.com.au  Fact Sheet for Healthcare Providers: IncredibleEmployment.be  This test is not yet approved or cleared by the [place] FDA and has been authorized for detection and/or diagnosis of SARS-CoV-2 by FDA under an Emergency Use Authorization (EUA). This EUA will remain in effect (meaning this test can be used) for the duration of the COVID-19 declaration under Section 564(b)(1) of the Act, 21 U.S.C. section 360bbb-3(b)(1), unless the authorization is terminated or revoked.  Performed at [facility], [address] [place], [place] [address]   Blood Culture (routine x 2)     Status: None (Preliminary result)   Collection Time: 08/08/20  5:30 AM   Specimen: BLOOD RIGHT HAND  Result Value Ref Range Status   Specimen Description BLOOD RIGHT HAND  Final   Special Requests   Final    BOTTLES DRAWN AEROBIC AND ANAEROBIC Blood Culture results may not be optimal due to an inadequate volume of blood received in culture bottles   Culture   Final    NO GROWTH 2 DAYS Performed at [facility], [address]  [place], [place] [address]    Report Status PENDING  Incomplete  Blood Culture (routine x 2)     Status: None (Preliminary result)   Collection Time: 08/08/20  5:47 AM   Specimen: BLOOD LEFT HAND  Result Value Ref Range Status   Specimen Description BLOOD LEFT HAND  Final   Special Requests   Final    BOTTLES DRAWN AEROBIC ONLY Blood Culture results may not be optimal due to an inadequate volume of blood received in culture bottles   Culture   Final    NO GROWTH 2 DAYS Performed at [facility], [address], [place], [place] [address]    Report Status PENDING  Incomplete  Culture, Urine     Status: Abnormal (Preliminary result)   Collection Time: 08/08/20 11:06 PM   Specimen: Urine, Clean Catch  Result Value Ref Range Status   Specimen Description   Final    URINE, CLEAN CATCH Performed at [facility], [address] [place], [place] [address]    Special Requests   Final    NONE Performed at [facility], [address], [place], [place] [address]    Culture (  A)  Final    >=100,000 COLONIES/mL ESCHERICHIA COLI SUSCEPTIBILITIES TO FOLLOW Performed at [facility] [address] [place], [place] [address]    Report Status PENDING  Incomplete     Scheduled Meds: . atorvastatin  80 mg Oral q1800  . azithromycin  500 mg Oral Daily  . cholecalciferol  2,000 Units Oral Daily  . escitalopram  20 mg Oral Daily  . furosemide  40 mg Intravenous Daily  . insulin aspart  0-20 Units Subcutaneous TID WC  . insulin aspart  0-5 Units Subcutaneous QHS  . insulin aspart  28 Units Subcutaneous Once  . insulin aspart  4 Units Subcutaneous TID WC  . [START ON 08/11/2020] insulin glargine  50 Units Subcutaneous Daily  . mouth rinse  15 mL Mouth Rinse BID  . memantine  5 mg Oral BID  . metoprolol succinate  100 mg Oral Daily  . rivaroxaban  20 mg Oral Q breakfast  . sodium chloride flush  3 mL Intravenous Q12H   Continuous Infusions: . sodium chloride    . cefTRIAXone (ROCEPHIN)  IV  1 g (08/09/20 1831)    Procedures/Studies: DG Chest Port 1 View  Result Date: 08/08/2020 CLINICAL DATA:  Shortness of breath and hypoxia. Decreased oxygen saturation. EXAM: PORTABLE CHEST 1 VIEW COMPARISON:  08/15/2019 FINDINGS: Cardiac enlargement. Patchy perihilar infiltrates in both lungs, possibly edema or multifocal pneumonia. No pleural effusions. No pneumothorax. Mediastinal contours appear intact. Calcification of the aorta. IMPRESSION: Cardiac enlargement with patchy perihilar infiltrates bilaterally suggesting edema or multifocal pneumonia. Electronically Signed   By: Lucienne Capers M.D.   On: 08/08/2020 05:19   ECHOCARDIOGRAM COMPLETE  Result Date: 08/09/2020    ECHOCARDIOGRAM REPORT   Patient Name:   Jenny Glover Date of Exam: 08/09/2020 Medical Rec #:  [id number]      Height:       70.0 in Accession #:    [id number]     Weight:       243.4 lb Date of Birth:  05-24-1946     BSA:          2.269 m Patient Age:    66 years       BP:           169/68 mmHg Patient Gender: F              HR:           54 bpm. Exam Location:  [facility] Procedure: 2D Echo, Cardiac Doppler and Color Doppler Indications:    CHF-Acute Diastolic 497.02 / O37.85  History:        Patient has prior history of Echocardiogram examinations, most                 recent 07/22/2018. CAD, Arrythmias:Atrial Fibrillation; Risk                 Factors:Hypertension, Diabetes and Dyslipidemia. Aortic                 stenosis, OSA on CPAP, ARF (acute renal failure).  Sonographer:    Alvino Chapel RCS Referring Phys: [phone] Jovani Colquhoun IMPRESSIONS  1. Left ventricular ejection fraction, by estimation, is 60 to 65%. The left ventricle has normal function. The left ventricle has no regional wall motion abnormalities. There is mild concentric left ventricular hypertrophy. Left ventricular diastolic function could not be evaluated.  2. Right ventricular systolic function is normal. The right ventricular size is normal. There is severely  elevated pulmonary artery systolic pressure.  3. Left atrial size was severely dilated.  4. The mitral valve is normal in structure. Mild mitral valve regurgitation. No evidence of mitral stenosis.  5. The aortic valve is tricuspid. There is moderate calcification of the aortic valve. There is severe thickening of the aortic valve. Aortic valve regurgitation is trivial. Severe aortic valve stenosis.  6. The inferior vena cava is dilated in size with <50% respiratory variability, suggesting right atrial pressure of 15 mmHg. Comparison(s): Prior images reviewed side by side. Aortic stenosis has worsened and is now severe. FINDINGS  Left Ventricle: Left ventricular ejection fraction, by estimation, is 60 to 65%. The left ventricle has normal function. The left ventricle has no regional wall motion abnormalities. The left ventricular internal cavity size was normal in size. There is  mild concentric left ventricular hypertrophy. Left ventricular diastolic function could not be evaluated due to atrial fibrillation. Left ventricular diastolic function could not be evaluated. Right Ventricle: The right ventricular size is normal. No increase in right ventricular wall thickness. Right ventricular systolic function is normal. There is severely elevated pulmonary artery systolic pressure. The tricuspid regurgitant velocity is 3.76 m/s, and with an assumed right atrial pressure of 15 mmHg, the estimated right ventricular systolic pressure is 16.1 mmHg. Left Atrium: Left atrial size was severely dilated. Right Atrium: Right atrial size was normal in size. Pericardium: There is no evidence of pericardial effusion. Mitral Valve: The mitral valve is normal in structure. Mild to moderate mitral annular calcification. Mild mitral valve regurgitation. No evidence of mitral valve stenosis. Tricuspid Valve: The tricuspid valve is normal in structure. Tricuspid valve regurgitation is trivial. Aortic Valve: The aortic valve is tricuspid.  There is moderate calcification of the aortic valve. There is severe thickening of the aortic valve. Aortic valve regurgitation is trivial. Aortic regurgitation PHT measures 561 msec. Severe aortic stenosis is  present. Aortic valve mean gradient measures 34.8 mmHg. Aortic valve peak gradient measures 61.8 mmHg. Aortic valve area, by VTI measures 0.79 cm. Pulmonic Valve: The pulmonic valve was grossly normal. Pulmonic valve regurgitation is trivial. Aorta: The aortic root is normal in size and structure. Venous: The inferior vena cava is dilated in size with less than 50% respiratory variability, suggesting right atrial pressure of 15 mmHg. IAS/Shunts: No atrial level shunt detected by color flow Doppler.  LEFT VENTRICLE PLAX 2D LVIDd:         4.70 cm LVIDs:         2.50 cm LV PW:         1.30 cm LV IVS:        1.40 cm LVOT diam:     2.00 cm LV SV:         84 LV SV Index:   37 LVOT Area:     3.14 cm  RIGHT VENTRICLE RV S prime:     13.80 cm/s TAPSE (M-mode): 2.0 cm LEFT ATRIUM              Index       RIGHT ATRIUM           Index LA diam:        4.30 cm  1.90 cm/m  RA Area:     25.80 cm LA Vol (A2C):   121.0 ml 53.33 ml/m RA Volume:   80.70 ml  35.57 ml/m LA Vol (A4C):   112.0 ml 49.36 ml/m LA Biplane Vol: 118.0 ml 52.01 ml/m  AORTIC VALVE AV Area (Vmax):  0.76 cm AV Area (Vmean):   0.85 cm AV Area (VTI):     0.79 cm AV Vmax:           393.00 cm/s AV Vmean:          260.726 cm/s AV VTI:            1.057 m AV Peak Grad:      61.8 mmHg AV Mean Grad:      34.8 mmHg LVOT Vmax:         94.60 cm/s LVOT Vmean:        70.900 cm/s LVOT VTI:          0.267 m LVOT/AV VTI ratio: 0.25 AI PHT:            561 msec  AORTA Ao Root diam: 3.80 cm MITRAL VALVE                TRICUSPID VALVE MV Area (PHT): 3.72 cm     TR Peak grad:   56.6 mmHg MV Decel Time: 204 msec     TR Vmax:        376.00 cm/s MV E velocity: 180.00 cm/s                             SHUNTS                             Systemic VTI:  0.27 m                              Systemic Diam: 2.00 cm Dani Gobble Croitoru MD Electronically signed by Sanda Klein MD Signature Date/Time: 08/09/2020/5:02:47 PM    Final     Orson Eva, DO  [facility]  If 7PM-7AM, please contact night-coverage [email or web address] Password [email or web address] 08/10/2020, 5:06 PM   LOS: 2 days

## 2020-08-10 NOTE — Plan of Care (Signed)

## 2020-08-11 ENCOUNTER — Inpatient Hospital Stay (HOSPITAL_COMMUNITY): Payer: Medicare HMO

## 2020-08-11 DIAGNOSIS — I350 Nonrheumatic aortic (valve) stenosis: Secondary | ICD-10-CM | POA: Diagnosis not present

## 2020-08-11 DIAGNOSIS — J9601 Acute respiratory failure with hypoxia: Secondary | ICD-10-CM | POA: Diagnosis not present

## 2020-08-11 DIAGNOSIS — I482 Chronic atrial fibrillation, unspecified: Secondary | ICD-10-CM | POA: Diagnosis not present

## 2020-08-11 DIAGNOSIS — I5031 Acute diastolic (congestive) heart failure: Secondary | ICD-10-CM | POA: Diagnosis not present

## 2020-08-11 LAB — BASIC METABOLIC PANEL
Anion gap: 10 (ref 5–15)
BUN: 41 mg/dL — ABNORMAL HIGH (ref 8–23)
CO2: 30 mmol/L (ref 22–32)
Calcium: 9.2 mg/dL (ref 8.9–10.3)
Chloride: 98 mmol/L (ref 98–111)
Creatinine, Ser: 1.11 mg/dL — ABNORMAL HIGH (ref 0.44–1.00)
GFR, Estimated: 52 mL/min — ABNORMAL LOW (ref >60)
Glucose, Bld: 198 mg/dL — ABNORMAL HIGH (ref 70–99)
Potassium: 3.8 mmol/L (ref 3.5–5.1)
Sodium: 138 mmol/L (ref 135–145)

## 2020-08-11 LAB — CBC
HCT: 41.5 % (ref 36.0–46.0)
Hemoglobin: 12.7 g/dL (ref 12.0–15.0)
MCH: 28.5 pg (ref 26.0–34.0)
MCHC: 30.6 g/dL (ref 30.0–36.0)
MCV: 93 fL (ref 80.0–100.0)
Platelets: 306 10*3/uL (ref 150–400)
RBC: 4.46 MIL/uL (ref 3.87–5.11)
RDW: 14.2 % (ref 11.5–15.5)
WBC: 10.1 10*3/uL (ref 4.0–10.5)
nRBC: 0 % (ref 0.0–0.2)

## 2020-08-11 LAB — URINE CULTURE: Culture: >=100000 — AB

## 2020-08-11 LAB — MAGNESIUM: Magnesium: 1.8 mg/dL (ref 1.7–2.4)

## 2020-08-11 LAB — GLUCOSE, CAPILLARY
Glucose-Capillary: 190 mg/dL — ABNORMAL HIGH (ref 70–99)
Glucose-Capillary: 196 mg/dL — ABNORMAL HIGH (ref 70–99)

## 2020-08-11 MED ORDER — INSULIN GLARGINE 100 UNIT/ML ~~LOC~~ SOLN
50.0000 [IU] | Freq: Every day | SUBCUTANEOUS | Status: DC
Start: 2020-08-11 — End: 2020-09-12

## 2020-08-11 MED ORDER — CEFDINIR 300 MG PO CAPS
300.0000 mg | ORAL_CAPSULE | Freq: Two times a day (BID) | ORAL | 0 refills | Status: DC
Start: 2020-08-11 — End: 2020-09-12

## 2020-08-11 MED ORDER — CEFDINIR 300 MG PO CAPS
300.0000 mg | ORAL_CAPSULE | Freq: Two times a day (BID) | ORAL | Status: DC
  Administered 2020-08-11: 300 mg via ORAL
  Filled 2020-08-11: qty 1

## 2020-08-11 MED ORDER — AMLODIPINE BESYLATE 5 MG PO TABS
5.0000 mg | ORAL_TABLET | Freq: Every day | ORAL | Status: DC
  Administered 2020-08-11: 5 mg via ORAL
  Filled 2020-08-11: qty 1

## 2020-08-11 MED ORDER — METOPROLOL SUCCINATE ER 50 MG PO TB24
50.0000 mg | ORAL_TABLET | Freq: Every day | ORAL | 1 refills | Status: DC
Start: 2020-08-12 — End: 2020-09-12

## 2020-08-11 MED ORDER — FUROSEMIDE 40 MG PO TABS
40.0000 mg | ORAL_TABLET | Freq: Every day | ORAL | 1 refills | Status: DC
Start: 2020-08-12 — End: 2020-09-12

## 2020-08-11 MED ORDER — FUROSEMIDE 40 MG PO TABS: 40.0000 mg | ORAL_TABLET | Freq: Every day | ORAL | Status: DC

## 2020-08-11 NOTE — Evaluation (Signed)
Physical Therapy Evaluation Patient Details Name: Jenny Glover MRN: [id number] DOB: 25-Jan-1946 Today's Date: 08/11/2020   History of Present Illness  74 y.o. female with medical history of chronic atrial fibrillation status post DCCV 2006, diabetes mellitus type 2, hypertension, hyperlipidemia, dementia presenting with 1 day history of shortness of breath. The patient is a poor historian secondary to her dementia. Much of this history is obtained from review of the medical record and speaking with the patient's spouse. Apparently, the patient had been in her usual state of health until 08/07/2020 when she developed shortness of breath with a nonproductive cough. There is no reports of fevers, chills, chest pain, headache, hemoptysis, nausea, vomiting, diarrhea, abdominal pain, dysuria, hematuria. He states that her mental status has been at her usual baseline. She has been compliant with her medications. There is been no recent changes in her medications. Spouse does note that patient has chronic leg edema which has not significantly changed.  Clinical Impression  Patient very agreeable to therapy. Patient required step by step commands to perform all bed mobilities as well as moderate assist. Difficult for patient to get bedside. Attempted sit to stand 3 times with patient with moderate to max assist and lots of verbal cues. Patient unable to stand up fully as she does not put weight on her right lower extremity and treated her right lower extremity as if it wasn't there. Getting back into bed patient required max assist to assist right lower extremity as she does not actively move her leg and step by step commands to scoot up in bed. Spoke with patient's son via phone and they have hospital bed, wheelchair, BSC, RW and think they can meet the needs of the patient at home in regards to mobility requirements but that he was going to check with his father first. Recommending venue below if family cannot  provide the level of assistance required at home.      Follow Up Recommendations SNF;Home health PT;Supervision/Assistance - 24 hour;Supervision for mobility/OOB    Equipment Recommendations       Recommendations for Other Services       Precautions / Restrictions Precautions Precautions: Fall      Mobility  Bed Mobility Overal bed mobility: Needs Assistance Bed Mobility: Rolling;Sidelying to Sit;Sit to Supine Rolling: Min assist (verbal cues) Sidelying to sit: Max assist;Mod assist (verbal cues)   Sit to supine: Mod assist;Max assist (verbal cues, max assist for right LE)   General bed mobility comments: slow labored movements    Transfers Overall transfer level: Needs assistance Equipment used: Rolling walker (2 wheeled) Transfers: Sit to/from Stand Sit to Stand: Max assist         General transfer comment: verbal cues to push off from bed and then place hand on walker, patient unable to place hand on walker and fully stand up even with max assist  Ambulation/Gait             General Gait Details: unable  Stairs            Wheelchair Mobility    Modified Rankin (Stroke Patients Only)       Balance Overall balance assessment: Needs assistance     Sitting balance - Comments: able with one UE support and no feet supported       Standing balance comment: needs moderate to max assist with UE support, doesnt stand up all the way  Pertinent Vitals/Pain Pain Assessment: No/denies pain    Home Living Family/patient expects to be discharged to:: Private residence Living Arrangements: Spouse/significant other Available Help at Discharge: Family Type of Home: House Home Access: Stairs to enter Entrance Stairs-Rails: None Entrance Stairs-Number of Steps: 6 Home Layout:  (patient unsure - poor historian) Home Equipment: Environmental consultant - 2 wheels;Bedside commode;Hospital bed;Wheelchair - manual      Prior  Function                 Hand Dominance        Extremity/Trunk Assessment        Lower Extremity Assessment Lower Extremity Assessment: Generalized weakness       Communication   Communication: HOH  Cognition Arousal/Alertness: Awake/alert   Overall Cognitive Status: History of cognitive impairments - at baseline                                 General Comments: slightly confused during session, required step by step commands      General Comments      Exercises General Exercises - Lower Extremity Toe Raises: AROM;Strengthening;Both;10 reps;Seated   Assessment/Plan    PT Assessment Patient needs continued PT services  PT Problem List Decreased strength;Decreased mobility;Pain;Decreased balance;Decreased activity tolerance;Decreased range of motion;Decreased knowledge of use of DME       PT Treatment Interventions Gait training;Functional mobility training;Therapeutic activities;Therapeutic exercise;Manual techniques;Stair training;Patient/family education;Neuromuscular re-education;Balance training    PT Goals (Current goals can be found in the Care Plan section)  Acute Rehab PT Goals Patient Stated Goal: to go home PT Goal Formulation: With patient Time For Goal Achievement: 08/25/20 Potential to Achieve Goals: Good    Frequency Min 3X/week   Barriers to discharge   requires moderate to max assist with transfers and bed mobilities, unsure if family able to provide support    Co-evaluation               AM-PAC PT "6 Clicks" Mobility  Outcome Measure Help needed turning from your back to your side while in a flat bed without using bedrails?: A Little Help needed moving from lying on your back to sitting on the side of a flat bed without using bedrails?: A Lot Help needed moving to and from a bed to a chair (including a wheelchair)?: Total Help needed standing up from a chair using your arms (e.g., wheelchair or bedside chair)?:  Total Help needed to walk in hospital room?: Total Help needed climbing 3-5 steps with a railing? : Total 6 Click Score: 9    End of Session Equipment Utilized During Treatment: Gait belt Activity Tolerance: Other (comment) (patient with no reports of pain during session but limited with R LE)   Nurse Communication: Mobility status;Precautions PT Visit Diagnosis: Unsteadiness on feet (R26.81);Other abnormalities of gait and mobility (R26.89);Muscle weakness (generalized) (M62.81);Difficulty in walking, not elsewhere classified (R26.2)    Time: 1133-1200 PT Time Calculation (min) (ACUTE ONLY): 27 min   Charges:   PT Evaluation $PT Eval Low Complexity: 1 Low PT Treatments $Therapeutic Activity: 8-22 mins       12:42 PM, 08/11/20 Jerene Pitch, DPT Physical Therapy with [facility]  [phone] office

## 2020-08-11 NOTE — TOC Transition Note (Signed)
Transition of Care [facility]) - CM/SW Discharge Note   Patient Details  Name: Jenny Glover MRN: [id number] Date of Birth: 10/06/1945  [facility] CM/SW Contact:  Natasha Bence, LCSW Phone Number: 08/11/2020, 12:05 PM   Clinical Narrative:    CSW spoke with patient's husband to inquire if family agreeable to [facility]. Husband reported that family is agreeable to [facility]. CSW contacted Georgina Snell with [facility] for referral. Georgina Snell agreeable to provide services to patient. TOC signing off.    Final next level of care: [place] Barriers to Discharge: Barriers Resolved   Patient Goals and CMS Choice Patient states their goals for this hospitalization and ongoing recovery are:: Discharge home with husband CMS Medicare.gov Compare Post Acute Care list provided to:: Patient Represenative (must comment) Choice offered to / list presented to : Patient  Discharge Placement                    Patient and family notified of of transfer: 08/11/20  Discharge Plan and Services In-house Referral: Clinical Social Work Discharge Planning Services: NA Post Acute Care Choice: NA          DME Arranged: N/A DME Agency: [place] Arranged: PT [place] Agency: [place] Date [facility] Agency Contacted: 08/11/20 Time [place]: 1204 Representative spoke with at [place]: [place] Determinants of Health ([place]) Interventions     Readmission Risk Interventions No flowsheet data found.

## 2020-08-11 NOTE — Plan of Care (Signed)
°  Problem: Acute Rehab PT Goals(only PT should resolve) Goal: Pt Will Go Supine/Side To Sit Flowsheets (Taken 08/11/2020 1248) Pt will go Supine/Side to Sit: with minimal assist Goal: Pt Will Go Sit To Supine/Side Flowsheets (Taken 08/11/2020 1248) Pt will go Sit to Supine/Side: with minimal assist Goal: Patient Will Transfer Sit To/From Stand Flowsheets (Taken 08/11/2020 1248) Patient will transfer sit to/from stand:  with minimal assist  with moderate assist Note: With RW  Goal: Pt Will Transfer Bed To Chair/Chair To Bed Flowsheets (Taken 08/11/2020 1248) Pt will Transfer Bed to Chair/Chair to Bed: with mod assist Note: With RW if needed     12:49 PM, 08/11/20 Jerene Pitch, DPT Physical Therapy with [facility]  [phone] office

## 2020-08-11 NOTE — Plan of Care (Signed)

## 2020-08-13 LAB — CULTURE, BLOOD (ROUTINE X 2)
Culture: NO GROWTH
Culture: NO GROWTH

## 2020-08-19 DIAGNOSIS — J9601 Acute respiratory failure with hypoxia: Secondary | ICD-10-CM | POA: Diagnosis not present

## 2020-08-19 DIAGNOSIS — N390 Urinary tract infection, site not specified: Secondary | ICD-10-CM | POA: Diagnosis not present

## 2020-08-19 DIAGNOSIS — J189 Pneumonia, unspecified organism: Secondary | ICD-10-CM | POA: Diagnosis not present

## 2020-08-19 DIAGNOSIS — I10 Essential (primary) hypertension: Secondary | ICD-10-CM | POA: Diagnosis not present

## 2020-08-19 DIAGNOSIS — E1122 Type 2 diabetes mellitus with diabetic chronic kidney disease: Secondary | ICD-10-CM | POA: Diagnosis not present

## 2020-08-19 DIAGNOSIS — B9620 Unspecified Escherichia coli [E. coli] as the cause of diseases classified elsewhere: Secondary | ICD-10-CM | POA: Diagnosis not present

## 2020-08-19 DIAGNOSIS — I350 Nonrheumatic aortic (valve) stenosis: Secondary | ICD-10-CM | POA: Diagnosis not present

## 2020-08-19 DIAGNOSIS — N1832 Chronic kidney disease, stage 3b: Secondary | ICD-10-CM | POA: Diagnosis not present

## 2020-08-19 DIAGNOSIS — I5031 Acute diastolic (congestive) heart failure: Secondary | ICD-10-CM | POA: Diagnosis not present

## 2020-08-19 DIAGNOSIS — I130 Hypertensive heart and chronic kidney disease with heart failure and stage 1 through stage 4 chronic kidney disease, or unspecified chronic kidney disease: Secondary | ICD-10-CM | POA: Diagnosis not present

## 2020-08-20 ENCOUNTER — Other Ambulatory Visit: Payer: Self-pay | Admitting: Family

## 2020-08-21 NOTE — Telephone Encounter (Signed)
Refill sent to pharmacy.   

## 2020-08-23 DIAGNOSIS — J9601 Acute respiratory failure with hypoxia: Secondary | ICD-10-CM | POA: Diagnosis not present

## 2020-08-23 DIAGNOSIS — I5031 Acute diastolic (congestive) heart failure: Secondary | ICD-10-CM | POA: Diagnosis not present

## 2020-08-23 DIAGNOSIS — E1122 Type 2 diabetes mellitus with diabetic chronic kidney disease: Secondary | ICD-10-CM | POA: Diagnosis not present

## 2020-08-23 DIAGNOSIS — N390 Urinary tract infection, site not specified: Secondary | ICD-10-CM | POA: Diagnosis not present

## 2020-08-23 DIAGNOSIS — N1832 Chronic kidney disease, stage 3b: Secondary | ICD-10-CM | POA: Diagnosis not present

## 2020-08-23 DIAGNOSIS — I130 Hypertensive heart and chronic kidney disease with heart failure and stage 1 through stage 4 chronic kidney disease, or unspecified chronic kidney disease: Secondary | ICD-10-CM | POA: Diagnosis not present

## 2020-08-23 DIAGNOSIS — B9620 Unspecified Escherichia coli [E. coli] as the cause of diseases classified elsewhere: Secondary | ICD-10-CM | POA: Diagnosis not present

## 2020-08-23 DIAGNOSIS — I350 Nonrheumatic aortic (valve) stenosis: Secondary | ICD-10-CM | POA: Diagnosis not present

## 2020-08-23 DIAGNOSIS — J189 Pneumonia, unspecified organism: Secondary | ICD-10-CM | POA: Diagnosis not present

## 2020-08-28 DIAGNOSIS — J189 Pneumonia, unspecified organism: Secondary | ICD-10-CM | POA: Diagnosis not present

## 2020-08-28 DIAGNOSIS — J9601 Acute respiratory failure with hypoxia: Secondary | ICD-10-CM | POA: Diagnosis not present

## 2020-08-28 DIAGNOSIS — N390 Urinary tract infection, site not specified: Secondary | ICD-10-CM | POA: Diagnosis not present

## 2020-08-28 DIAGNOSIS — B9620 Unspecified Escherichia coli [E. coli] as the cause of diseases classified elsewhere: Secondary | ICD-10-CM | POA: Diagnosis not present

## 2020-08-28 DIAGNOSIS — I350 Nonrheumatic aortic (valve) stenosis: Secondary | ICD-10-CM | POA: Diagnosis not present

## 2020-08-28 DIAGNOSIS — I5031 Acute diastolic (congestive) heart failure: Secondary | ICD-10-CM | POA: Diagnosis not present

## 2020-08-28 DIAGNOSIS — I130 Hypertensive heart and chronic kidney disease with heart failure and stage 1 through stage 4 chronic kidney disease, or unspecified chronic kidney disease: Secondary | ICD-10-CM | POA: Diagnosis not present

## 2020-08-28 DIAGNOSIS — E1122 Type 2 diabetes mellitus with diabetic chronic kidney disease: Secondary | ICD-10-CM | POA: Diagnosis not present

## 2020-08-28 DIAGNOSIS — N1832 Chronic kidney disease, stage 3b: Secondary | ICD-10-CM | POA: Diagnosis not present

## 2020-08-30 DIAGNOSIS — J9601 Acute respiratory failure with hypoxia: Secondary | ICD-10-CM | POA: Diagnosis not present

## 2020-08-30 DIAGNOSIS — N1832 Chronic kidney disease, stage 3b: Secondary | ICD-10-CM | POA: Diagnosis not present

## 2020-08-30 DIAGNOSIS — B9620 Unspecified Escherichia coli [E. coli] as the cause of diseases classified elsewhere: Secondary | ICD-10-CM | POA: Diagnosis not present

## 2020-08-30 DIAGNOSIS — I130 Hypertensive heart and chronic kidney disease with heart failure and stage 1 through stage 4 chronic kidney disease, or unspecified chronic kidney disease: Secondary | ICD-10-CM | POA: Diagnosis not present

## 2020-08-30 DIAGNOSIS — E1122 Type 2 diabetes mellitus with diabetic chronic kidney disease: Secondary | ICD-10-CM | POA: Diagnosis not present

## 2020-08-30 DIAGNOSIS — N390 Urinary tract infection, site not specified: Secondary | ICD-10-CM | POA: Diagnosis not present

## 2020-08-30 DIAGNOSIS — I5031 Acute diastolic (congestive) heart failure: Secondary | ICD-10-CM | POA: Diagnosis not present

## 2020-08-30 DIAGNOSIS — J189 Pneumonia, unspecified organism: Secondary | ICD-10-CM | POA: Diagnosis not present

## 2020-08-30 DIAGNOSIS — I350 Nonrheumatic aortic (valve) stenosis: Secondary | ICD-10-CM | POA: Diagnosis not present

## 2020-09-03 DIAGNOSIS — I350 Nonrheumatic aortic (valve) stenosis: Secondary | ICD-10-CM | POA: Diagnosis not present

## 2020-09-03 DIAGNOSIS — I130 Hypertensive heart and chronic kidney disease with heart failure and stage 1 through stage 4 chronic kidney disease, or unspecified chronic kidney disease: Secondary | ICD-10-CM | POA: Diagnosis not present

## 2020-09-03 DIAGNOSIS — B9620 Unspecified Escherichia coli [E. coli] as the cause of diseases classified elsewhere: Secondary | ICD-10-CM | POA: Diagnosis not present

## 2020-09-03 DIAGNOSIS — J9601 Acute respiratory failure with hypoxia: Secondary | ICD-10-CM | POA: Diagnosis not present

## 2020-09-03 DIAGNOSIS — N390 Urinary tract infection, site not specified: Secondary | ICD-10-CM | POA: Diagnosis not present

## 2020-09-03 DIAGNOSIS — J189 Pneumonia, unspecified organism: Secondary | ICD-10-CM | POA: Diagnosis not present

## 2020-09-03 DIAGNOSIS — N1832 Chronic kidney disease, stage 3b: Secondary | ICD-10-CM | POA: Diagnosis not present

## 2020-09-03 DIAGNOSIS — I5031 Acute diastolic (congestive) heart failure: Secondary | ICD-10-CM | POA: Diagnosis not present

## 2020-09-03 DIAGNOSIS — E1122 Type 2 diabetes mellitus with diabetic chronic kidney disease: Secondary | ICD-10-CM | POA: Diagnosis not present

## 2020-09-05 DIAGNOSIS — I5031 Acute diastolic (congestive) heart failure: Secondary | ICD-10-CM | POA: Diagnosis not present

## 2020-09-05 DIAGNOSIS — J9601 Acute respiratory failure with hypoxia: Secondary | ICD-10-CM | POA: Diagnosis not present

## 2020-09-05 DIAGNOSIS — I130 Hypertensive heart and chronic kidney disease with heart failure and stage 1 through stage 4 chronic kidney disease, or unspecified chronic kidney disease: Secondary | ICD-10-CM | POA: Diagnosis not present

## 2020-09-05 DIAGNOSIS — I350 Nonrheumatic aortic (valve) stenosis: Secondary | ICD-10-CM | POA: Diagnosis not present

## 2020-09-05 DIAGNOSIS — N1832 Chronic kidney disease, stage 3b: Secondary | ICD-10-CM | POA: Diagnosis not present

## 2020-09-05 DIAGNOSIS — N390 Urinary tract infection, site not specified: Secondary | ICD-10-CM | POA: Diagnosis not present

## 2020-09-05 DIAGNOSIS — J189 Pneumonia, unspecified organism: Secondary | ICD-10-CM | POA: Diagnosis not present

## 2020-09-05 DIAGNOSIS — B9620 Unspecified Escherichia coli [E. coli] as the cause of diseases classified elsewhere: Secondary | ICD-10-CM | POA: Diagnosis not present

## 2020-09-05 DIAGNOSIS — E1122 Type 2 diabetes mellitus with diabetic chronic kidney disease: Secondary | ICD-10-CM | POA: Diagnosis not present

## 2020-09-10 DIAGNOSIS — E1122 Type 2 diabetes mellitus with diabetic chronic kidney disease: Secondary | ICD-10-CM | POA: Diagnosis not present

## 2020-09-10 DIAGNOSIS — I350 Nonrheumatic aortic (valve) stenosis: Secondary | ICD-10-CM | POA: Diagnosis not present

## 2020-09-10 DIAGNOSIS — N390 Urinary tract infection, site not specified: Secondary | ICD-10-CM | POA: Diagnosis not present

## 2020-09-10 DIAGNOSIS — N1832 Chronic kidney disease, stage 3b: Secondary | ICD-10-CM | POA: Diagnosis not present

## 2020-09-10 DIAGNOSIS — I5031 Acute diastolic (congestive) heart failure: Secondary | ICD-10-CM | POA: Diagnosis not present

## 2020-09-10 DIAGNOSIS — J9601 Acute respiratory failure with hypoxia: Secondary | ICD-10-CM | POA: Diagnosis not present

## 2020-09-10 DIAGNOSIS — J189 Pneumonia, unspecified organism: Secondary | ICD-10-CM | POA: Diagnosis not present

## 2020-09-10 DIAGNOSIS — B9620 Unspecified Escherichia coli [E. coli] as the cause of diseases classified elsewhere: Secondary | ICD-10-CM | POA: Diagnosis not present

## 2020-09-10 DIAGNOSIS — I130 Hypertensive heart and chronic kidney disease with heart failure and stage 1 through stage 4 chronic kidney disease, or unspecified chronic kidney disease: Secondary | ICD-10-CM | POA: Diagnosis not present

## 2020-09-11 ENCOUNTER — Ambulatory Visit: Payer: Medicare HMO | Admitting: Cardiology

## 2020-09-12 ENCOUNTER — Other Ambulatory Visit: Payer: Self-pay

## 2020-09-12 ENCOUNTER — Ambulatory Visit (HOSPITAL_COMMUNITY)
Admission: RE | Admit: 2020-09-12 | Discharge: 2020-09-12 | Disposition: A | Discharge status: Home / Self Care | Payer: Medicare HMO | Source: Ambulatory Visit | Attending: Cardiology | Admitting: Cardiology

## 2020-09-12 ENCOUNTER — Other Ambulatory Visit (HOSPITAL_COMMUNITY): Payer: Self-pay

## 2020-09-12 ENCOUNTER — Encounter (HOSPITAL_COMMUNITY): Payer: Self-pay | Admitting: Cardiology

## 2020-09-12 VITALS — BP 120/64 | HR 64 | Wt 246.6 lb

## 2020-09-12 DIAGNOSIS — R0602 Shortness of breath: Secondary | ICD-10-CM | POA: Diagnosis not present

## 2020-09-12 DIAGNOSIS — I350 Nonrheumatic aortic (valve) stenosis: Secondary | ICD-10-CM | POA: Diagnosis not present

## 2020-09-12 DIAGNOSIS — Z8673 Personal history of transient ischemic attack (TIA), and cerebral infarction without residual deficits: Secondary | ICD-10-CM | POA: Insufficient documentation

## 2020-09-12 DIAGNOSIS — Z8249 Family history of ischemic heart disease and other diseases of the circulatory system: Secondary | ICD-10-CM | POA: Insufficient documentation

## 2020-09-12 DIAGNOSIS — I4821 Permanent atrial fibrillation: Secondary | ICD-10-CM | POA: Diagnosis not present

## 2020-09-12 DIAGNOSIS — Z7901 Long term (current) use of anticoagulants: Secondary | ICD-10-CM | POA: Diagnosis not present

## 2020-09-12 DIAGNOSIS — I130 Hypertensive heart and chronic kidney disease with heart failure and stage 1 through stage 4 chronic kidney disease, or unspecified chronic kidney disease: Secondary | ICD-10-CM | POA: Insufficient documentation

## 2020-09-12 DIAGNOSIS — I482 Chronic atrial fibrillation, unspecified: Secondary | ICD-10-CM | POA: Diagnosis not present

## 2020-09-12 DIAGNOSIS — I5032 Chronic diastolic (congestive) heart failure: Secondary | ICD-10-CM | POA: Insufficient documentation

## 2020-09-12 DIAGNOSIS — I2510 Atherosclerotic heart disease of native coronary artery without angina pectoris: Secondary | ICD-10-CM | POA: Diagnosis not present

## 2020-09-12 DIAGNOSIS — N183 Chronic kidney disease, stage 3 unspecified: Secondary | ICD-10-CM | POA: Diagnosis not present

## 2020-09-12 DIAGNOSIS — Z794 Long term (current) use of insulin: Secondary | ICD-10-CM | POA: Diagnosis not present

## 2020-09-12 DIAGNOSIS — Z79899 Other long term (current) drug therapy: Secondary | ICD-10-CM | POA: Diagnosis not present

## 2020-09-12 DIAGNOSIS — F0390 Unspecified dementia without behavioral disturbance: Secondary | ICD-10-CM | POA: Insufficient documentation

## 2020-09-12 DIAGNOSIS — E1122 Type 2 diabetes mellitus with diabetic chronic kidney disease: Secondary | ICD-10-CM | POA: Diagnosis not present

## 2020-09-12 HISTORY — DX: Heart failure, unspecified: I50.9

## 2020-09-12 LAB — BASIC METABOLIC PANEL
Anion gap: 11 (ref 5–15)
BUN: 30 mg/dL — ABNORMAL HIGH (ref 8–23)
CO2: 25 mmol/L (ref 22–32)
Calcium: 9.3 mg/dL (ref 8.9–10.3)
Chloride: 102 mmol/L (ref 98–111)
Creatinine, Ser: 1.37 mg/dL — ABNORMAL HIGH (ref 0.44–1.00)
GFR, Estimated: 41 mL/min — ABNORMAL LOW (ref >60)
Glucose, Bld: 147 mg/dL — ABNORMAL HIGH (ref 70–99)
Potassium: 4.7 mmol/L (ref 3.5–5.1)
Sodium: 138 mmol/L (ref 135–145)

## 2020-09-12 LAB — CBC
HCT: 40.1 % (ref 36.0–46.0)
Hemoglobin: 12.3 g/dL (ref 12.0–15.0)
MCH: 28.9 pg (ref 26.0–34.0)
MCHC: 30.7 g/dL (ref 30.0–36.0)
MCV: 94.4 fL (ref 80.0–100.0)
Platelets: 237 10*3/uL (ref 150–400)
RBC: 4.25 MIL/uL (ref 3.87–5.11)
RDW: 14.4 % (ref 11.5–15.5)
WBC: 9 10*3/uL (ref 4.0–10.5)
nRBC: 0 % (ref 0.0–0.2)

## 2020-09-12 MED ORDER — FUROSEMIDE 40 MG PO TABS
ORAL_TABLET | ORAL | 3 refills | Status: DC
Start: 2020-09-12 — End: 2020-09-25

## 2020-09-12 NOTE — Patient Instructions (Addendum)
INCREASE Lasix 40mg  (1 tablet) in the morning and 20mg  (1/2 tablet) in the evening  Labs done today, your results will be available in MyChart, we will contact you for abnormal readings.  Your physician recommends that you schedule repeat labs in 10 days  Your physician recommends that you schedule a follow-up appointment in: 3-4 weeks  If you have any questions or concerns before your next appointment please send [place] a message through [place] or call our office at [phone].    TO LEAVE A MESSAGE FOR THE NURSE SELECT OPTION 2, PLEASE LEAVE A MESSAGE INCLUDING:  YOUR NAME  DATE OF BIRTH  CALL BACK NUMBER  REASON FOR CALL**this is important as we prioritize the call backs  YOU WILL RECEIVE A CALL BACK THE SAME DAY AS LONG AS YOU CALL BEFORE 4:00 PM     HEART CATHETERIZATION INSTRUCTIONS   You are scheduled for a Cardiac Catheterization on Wednesday, January 5 with Dr. Loralie Champagne.  1. Please arrive at the [facility] (Main Entrance A) at [facility]: [address] [place],  [address] at 11:00 AM (This time is two hours before your procedure to ensure your preparation). Free valet parking service is available.   Special note: Every effort is made to have your procedure done on time. Please understand that emergencies sometimes delay scheduled procedures.  2. Diet: Do not eat solid foods after midnight.  The patient may have clear liquids until 5am upon the day of the procedure.  3. COVID TEST: 1/3 3:15PM   [place], [place] [address]  4. Medication instructions in preparation for your procedure:  DO NOT TAKE XARELTO 1/4-1/6.  DO NOT TAKE INSULIN OR LASIX ON THE MORNING OF YOUR PROCEDURE.  TAKE ONLY 1/2 DOSE OF INSULIN THE EVENING BEFORE YOUR PROCEDURE  On the morning of your procedure, take any morning medicines NOT listed above.  You may use sips of water.  5. Plan for one night stay--bring personal belongings. 6. Bring a current  list of your medications and current insurance cards. 7. You MUST have a responsible person to drive you home. 8. Someone MUST be with you the first 24 hours after you arrive home or your discharge will be delayed. 9. Please wear clothes that are easy to get on and off and wear slip-on shoes.  Thank you for allowing [place] to care for you!   -- [place] [facility]

## 2020-09-13 NOTE — H&P (View-Only) (Signed)
PCP: Reynold Bowen, MD HF Cardiology: Dr. Aundra Dubin  74 y.o. with history of type 2 diabetes, CAD, permanent atrial fibrillation, prior CVA, severe aortic stenosis, diastolic CHF, and dementia was referred by Dr. Carles Collet for evaluation of severe aortic stenosis and CHF. Patient's dementia is moderate, short-term memory is poor.  Her husband and daughter help with history.   She has CAD, with PCI to PDA in 2013.  She has had no recent chest pain.  Her atrial fibrillation is now permanent (used to be on Tikosyn but this was stopped as it was not successful).  In 11/21, she was admitted with dyspnea and found to be in CHF.  Cardiology was not consulted.  IV Lasix was started.  Echo in 11/21 showed EF 60-65%, mild LVH, normal RV, severe LAE, PASP 72 mmHg, dilated IVC, severe AS with mean gradient 35 and AVA 0.79 cm^2.   She is now on po Lasix at home.  She does ok walking around the house.  She gets short of breath walking longer distances.  No chest pain.  No orthopnea/PND.  No falls or syncope.   ECG (personally reviewed): Atrial fibrillation, left axis deviation  Labs (11/21): hgb 12.7, K 3.8, creatinine 1.11  PMH: 1. Type 2 diabetes 2. Dementia 3. Atrial fibrillation: Now chronic.  She was on Tikosyn in the past, this has been stopped.  4. CAD: PCI to PDA in 2013.  5. CKD: Stage 3. 6. OSA 7. CVA: 6/16.  8. Aortic stenosis: Moderate on 11/19 echo.  - Echo (11/21) with EF 60-65%, mild LVH, normal RV, severe LAE, PASP 72 mmHg, dilated IVC, severe AS with mean gradient 35 and AVA 0.79 cm^2.  9. Chronic diastolic CHF: Echo (18/56) with EF 60-65%, mild LVH, normal RV, severe LAE, PASP 72 mmHg, dilated IVC, severe AS with mean gradient 35 and AVA 0.79 cm^2.  10. HTN  Social History   Socioeconomic History  . Marital status: Married    Spouse name: Not on file  . Number of children: 3  . Years of education: College   . Highest education level: Not on file  Occupational History  . Occupation:  retired Marine scientist  Tobacco Use  . Smoking status: Never Smoker  . Smokeless tobacco: Never Used  Vaping Use  . Vaping Use: Never used  Substance and Sexual Activity  . Alcohol use: No    Alcohol/week: 0.0 standard drinks  . Drug use: No  . Sexual activity: Not on file  Other Topics Concern  . Not on file  Social History Narrative   Occasionally drinks coffee, tea, soda    Social Determinants of Health   Financial Resource Strain: Not on file  Food Insecurity: Not on file  Transportation Needs: Not on file  Physical Activity: Not on file  Stress: Not on file  Social Connections: Not on file  Intimate Partner Violence: Not on file   Family History  Problem Relation Age of Onset  . Diabetes Father   . Coronary artery disease Father   . Stroke Mother    ROS: All systems reviewed and negative except as per HPI.   Current Outpatient Medications  Medication Sig Dispense Refill  . acetaminophen (TYLENOL) 500 MG tablet Take 1 tablet (500 mg total) by mouth every 6 (six) hours as needed (pain). 30 tablet 0  . amLODipine (NORVASC) 5 MG tablet Take 5 mg by mouth daily.    Marland Kitchen atorvastatin (LIPITOR) 80 MG tablet Take 1 tablet (80 mg total) by mouth  daily. Needs appointment with Dr. Raliegh Ip for further refills. 30 tablet 0  . cholecalciferol (VITAMIN D) 1000 UNITS tablet Take 2,000 Units by mouth daily.    Marland Kitchen escitalopram (LEXAPRO) 20 MG tablet Take 20 mg by mouth daily.    . insulin glargine (LANTUS) 100 UNIT/ML injection Inject 45 Units into the skin daily.    . insulin lispro (HUMALOG) 100 UNIT/ML injection Inject 10 Units into the skin 3 (three) times daily before meals.     . memantine (NAMENDA) 5 MG tablet Take 5 mg 2 (two) times daily by mouth.    . metoprolol succinate (TOPROL-XL) 100 MG 24 hr tablet Take 100 mg by mouth daily. Take with or immediately following a meal.    . Rivaroxaban (XARELTO) 20 MG TABS Take 1 tablet (20 mg total) by mouth daily. 30 tablet 0  . furosemide (LASIX) 40 MG  tablet Take 1 tablet (40 mg total) by mouth in the morning AND 0.5 tablets (20 mg total) every evening. 45 tablet 3   No current facility-administered medications for this encounter.   BP 120/64   Pulse 64   Wt 111.9 kg (246 lb 9.6 oz)   SpO2 96%   BMI 35.38 kg/m  General: NAD Neck: JVP 8 cm, no thyromegaly or thyroid nodule.  Lungs: Mild crackles at bases bilaterally.  CV: Nondisplaced PMI.  Heart irregular S1/S2, no S3/S4, no murmur.  Trace ankle edema.  No carotid bruit.  Normal pedal pulses.  Abdomen: Soft, nontender, no hepatosplenomegaly, no distention.  Skin: Intact without lesions or rashes.  Neurologic: Alert and oriented x 3.  Psych: Normal affect. Extremities: No clubbing or cyanosis.  HEENT: Normal.   Assessment/Plan: 1. Chronic diastolic CHF: In setting of severe aortic stenosis.  Echo in 11/21 with EF 60-65%, mild LVH, normal RV, severe LAE, PASP 72 mmHg, dilated IVC, severe AS with mean gradient 35 and AVA 0.79 cm^2. On exam today, I think she is mildly volume overloaded.  NYHA class II-III symptoms.  - Increase Lasix to 40 qam/20 qpm.  BMET today and in 10 days.  2. HTN: BP is controlled on amlodipine and Toprol XL.  3. CKD: Stage 3.  Last creatinine 1.11.   - BMET today.  4. Atrial fibrillation: Permanent.  Rate is controlled.  No longer on anti-arrhythmics due to lack of success. - Continue Toprol XL 100 mg daily.  - Continue Xarelto 20 mg daily, CBC today.  5. Aortic stenosis: Severe AS on 11/21 echo.  NYHA class II-III symptoms with CHF.  She would be a TAVR candidate, but moderate dementia is a concern. She does seem to have reasonable quality of life and is able to perform her ADLs.  Family is concerned about the risk for worsening of dementia with general anesthesia. After extensive discussion, we have decided to proceed with right/left heart cath.  I discussed risks/benefits and family/patient agree to procedure. She will hold Xarelto the day before and the day  of procedure.  Hold Lasix the day of procedure. After cath, I will discuss her with the structural heart team.  6. CAD: PCI to PDA in 2013.  No recent chest pain.  - As above, plan for coronary angiography as part of TAVR workup.  - No ASA given Xarelto use.  - Continue atorvastatin.  7. History of CVA: Limit time off Xarelto.   Followup 2 wks post-cath.   Loralie Champagne 09/13/2020

## 2020-09-13 NOTE — Progress Notes (Signed)
PCP: Reynold Bowen, MD HF Cardiology: Dr. Aundra Dubin  74 y.o. with history of type 2 diabetes, CAD, permanent atrial fibrillation, prior CVA, severe aortic stenosis, diastolic CHF, and dementia was referred by Dr. Carles Collet for evaluation of severe aortic stenosis and CHF. Patient's dementia is moderate, short-term memory is poor.  Her husband and daughter help with history.   She has CAD, with PCI to PDA in 2013.  She has had no recent chest pain.  Her atrial fibrillation is now permanent (used to be on Tikosyn but this was stopped as it was not successful).  In 11/21, she was admitted with dyspnea and found to be in CHF.  Cardiology was not consulted.  IV Lasix was started.  Echo in 11/21 showed EF 60-65%, mild LVH, normal RV, severe LAE, PASP 72 mmHg, dilated IVC, severe AS with mean gradient 35 and AVA 0.79 cm^2.   She is now on po Lasix at home.  She does ok walking around the house.  She gets short of breath walking longer distances.  No chest pain.  No orthopnea/PND.  No falls or syncope.   ECG (personally reviewed): Atrial fibrillation, left axis deviation  Labs (11/21): hgb 12.7, K 3.8, creatinine 1.11  PMH: 1. Type 2 diabetes 2. Dementia 3. Atrial fibrillation: Now chronic.  She was on Tikosyn in the past, this has been stopped.  4. CAD: PCI to PDA in 2013.  5. CKD: Stage 3. 6. OSA 7. CVA: 6/16.  8. Aortic stenosis: Moderate on 11/19 echo.  - Echo (11/21) with EF 60-65%, mild LVH, normal RV, severe LAE, PASP 72 mmHg, dilated IVC, severe AS with mean gradient 35 and AVA 0.79 cm^2.  9. Chronic diastolic CHF: Echo (82/42) with EF 60-65%, mild LVH, normal RV, severe LAE, PASP 72 mmHg, dilated IVC, severe AS with mean gradient 35 and AVA 0.79 cm^2.  10. HTN  Social History   Socioeconomic History  . Marital status: Married    Spouse name: Not on file  . Number of children: 3  . Years of education: College   . Highest education level: Not on file  Occupational History  . Occupation:  retired Marine scientist  Tobacco Use  . Smoking status: Never Smoker  . Smokeless tobacco: Never Used  Vaping Use  . Vaping Use: Never used  Substance and Sexual Activity  . Alcohol use: No    Alcohol/week: 0.0 standard drinks  . Drug use: No  . Sexual activity: Not on file  Other Topics Concern  . Not on file  Social History Narrative   Occasionally drinks coffee, tea, soda    Social Determinants of Health   Financial Resource Strain: Not on file  Food Insecurity: Not on file  Transportation Needs: Not on file  Physical Activity: Not on file  Stress: Not on file  Social Connections: Not on file  Intimate Partner Violence: Not on file   Family History  Problem Relation Age of Onset  . Diabetes Father   . Coronary artery disease Father   . Stroke Mother    ROS: All systems reviewed and negative except as per HPI.   Current Outpatient Medications  Medication Sig Dispense Refill  . acetaminophen (TYLENOL) 500 MG tablet Take 1 tablet (500 mg total) by mouth every 6 (six) hours as needed (pain). 30 tablet 0  . amLODipine (NORVASC) 5 MG tablet Take 5 mg by mouth daily.    Marland Kitchen atorvastatin (LIPITOR) 80 MG tablet Take 1 tablet (80 mg total) by mouth  daily. Needs appointment with Dr. Raliegh Ip for further refills. 30 tablet 0  . cholecalciferol (VITAMIN D) 1000 UNITS tablet Take 2,000 Units by mouth daily.    Marland Kitchen escitalopram (LEXAPRO) 20 MG tablet Take 20 mg by mouth daily.    . insulin glargine (LANTUS) 100 UNIT/ML injection Inject 45 Units into the skin daily.    . insulin lispro (HUMALOG) 100 UNIT/ML injection Inject 10 Units into the skin 3 (three) times daily before meals.     . memantine (NAMENDA) 5 MG tablet Take 5 mg 2 (two) times daily by mouth.    . metoprolol succinate (TOPROL-XL) 100 MG 24 hr tablet Take 100 mg by mouth daily. Take with or immediately following a meal.    . Rivaroxaban (XARELTO) 20 MG TABS Take 1 tablet (20 mg total) by mouth daily. 30 tablet 0  . furosemide (LASIX) 40 MG  tablet Take 1 tablet (40 mg total) by mouth in the morning AND 0.5 tablets (20 mg total) every evening. 45 tablet 3   No current facility-administered medications for this encounter.   BP 120/64   Pulse 64   Wt 111.9 kg (246 lb 9.6 oz)   SpO2 96%   BMI 35.38 kg/m  General: NAD Neck: JVP 8 cm, no thyromegaly or thyroid nodule.  Lungs: Mild crackles at bases bilaterally.  CV: Nondisplaced PMI.  Heart irregular S1/S2, no S3/S4, no murmur.  Trace ankle edema.  No carotid bruit.  Normal pedal pulses.  Abdomen: Soft, nontender, no hepatosplenomegaly, no distention.  Skin: Intact without lesions or rashes.  Neurologic: Alert and oriented x 3.  Psych: Normal affect. Extremities: No clubbing or cyanosis.  HEENT: Normal.   Assessment/Plan: 1. Chronic diastolic CHF: In setting of severe aortic stenosis.  Echo in 11/21 with EF 60-65%, mild LVH, normal RV, severe LAE, PASP 72 mmHg, dilated IVC, severe AS with mean gradient 35 and AVA 0.79 cm^2. On exam today, I think she is mildly volume overloaded.  NYHA class II-III symptoms.  - Increase Lasix to 40 qam/20 qpm.  BMET today and in 10 days.  2. HTN: BP is controlled on amlodipine and Toprol XL.  3. CKD: Stage 3.  Last creatinine 1.11.   - BMET today.  4. Atrial fibrillation: Permanent.  Rate is controlled.  No longer on anti-arrhythmics due to lack of success. - Continue Toprol XL 100 mg daily.  - Continue Xarelto 20 mg daily, CBC today.  5. Aortic stenosis: Severe AS on 11/21 echo.  NYHA class II-III symptoms with CHF.  She would be a TAVR candidate, but moderate dementia is a concern. She does seem to have reasonable quality of life and is able to perform her ADLs.  Family is concerned about the risk for worsening of dementia with general anesthesia. After extensive discussion, we have decided to proceed with right/left heart cath.  I discussed risks/benefits and family/patient agree to procedure. She will hold Xarelto the day before and the day  of procedure.  Hold Lasix the day of procedure. After cath, I will discuss her with the structural heart team.  6. CAD: PCI to PDA in 2013.  No recent chest pain.  - As above, plan for coronary angiography as part of TAVR workup.  - No ASA given Xarelto use.  - Continue atorvastatin.  7. History of CVA: Limit time off Xarelto.   Followup 2 wks post-cath.   Loralie Champagne 09/13/2020

## 2020-09-16 ENCOUNTER — Other Ambulatory Visit (HOSPITAL_COMMUNITY)
Admission: RE | Admit: 2020-09-16 | Discharge: 2020-09-16 | Disposition: A | Discharge status: Home / Self Care | Payer: Medicare HMO | Source: Ambulatory Visit | Attending: Cardiology | Admitting: Cardiology

## 2020-09-16 DIAGNOSIS — Z20822 Contact with and (suspected) exposure to covid-19: Secondary | ICD-10-CM | POA: Diagnosis not present

## 2020-09-16 DIAGNOSIS — Z01812 Encounter for preprocedural laboratory examination: Secondary | ICD-10-CM | POA: Insufficient documentation

## 2020-09-16 LAB — SARS CORONAVIRUS 2 (TAT 6-24 HRS): SARS Coronavirus 2: NEGATIVE

## 2020-09-18 ENCOUNTER — Encounter (HOSPITAL_COMMUNITY)
Admission: RE | Disposition: A | Discharge status: Home / Self Care | Payer: Self-pay | Source: Home / Self Care | Attending: Cardiology

## 2020-09-18 ENCOUNTER — Ambulatory Visit (HOSPITAL_COMMUNITY)
Admission: RE | Admit: 2020-09-18 | Discharge: 2020-09-18 | Disposition: A | Discharge status: Home / Self Care | Payer: Medicare HMO | Attending: Cardiology | Admitting: Cardiology

## 2020-09-18 DIAGNOSIS — I5032 Chronic diastolic (congestive) heart failure: Secondary | ICD-10-CM | POA: Insufficient documentation

## 2020-09-18 DIAGNOSIS — Z79899 Other long term (current) drug therapy: Secondary | ICD-10-CM | POA: Insufficient documentation

## 2020-09-18 DIAGNOSIS — Z7901 Long term (current) use of anticoagulants: Secondary | ICD-10-CM | POA: Diagnosis not present

## 2020-09-18 DIAGNOSIS — I2510 Atherosclerotic heart disease of native coronary artery without angina pectoris: Secondary | ICD-10-CM | POA: Diagnosis not present

## 2020-09-18 DIAGNOSIS — I4821 Permanent atrial fibrillation: Secondary | ICD-10-CM | POA: Insufficient documentation

## 2020-09-18 DIAGNOSIS — Z794 Long term (current) use of insulin: Secondary | ICD-10-CM | POA: Diagnosis not present

## 2020-09-18 DIAGNOSIS — E1122 Type 2 diabetes mellitus with diabetic chronic kidney disease: Secondary | ICD-10-CM | POA: Diagnosis not present

## 2020-09-18 DIAGNOSIS — I130 Hypertensive heart and chronic kidney disease with heart failure and stage 1 through stage 4 chronic kidney disease, or unspecified chronic kidney disease: Secondary | ICD-10-CM | POA: Diagnosis not present

## 2020-09-18 DIAGNOSIS — N183 Chronic kidney disease, stage 3 unspecified: Secondary | ICD-10-CM | POA: Diagnosis not present

## 2020-09-18 DIAGNOSIS — I350 Nonrheumatic aortic (valve) stenosis: Secondary | ICD-10-CM | POA: Diagnosis not present

## 2020-09-18 HISTORY — PX: RIGHT HEART CATH AND CORONARY ANGIOGRAPHY: CATH118264

## 2020-09-18 LAB — POCT I-STAT EG7
Acid-Base Excess: 3 mmol/L — ABNORMAL HIGH (ref 0.0–2.0)
Acid-Base Excess: 4 mmol/L — ABNORMAL HIGH (ref 0.0–2.0)
Bicarbonate: 29.9 mmol/L — ABNORMAL HIGH (ref 20.0–28.0)
Bicarbonate: 30.7 mmol/L — ABNORMAL HIGH (ref 20.0–28.0)
Calcium, Ion: 1.18 mmol/L (ref 1.15–1.40)
Calcium, Ion: 1.27 mmol/L (ref 1.15–1.40)
HCT: 35 % — ABNORMAL LOW (ref 36.0–46.0)
HCT: 36 % (ref 36.0–46.0)
Hemoglobin: 11.9 g/dL — ABNORMAL LOW (ref 12.0–15.0)
Hemoglobin: 12.2 g/dL (ref 12.0–15.0)
O2 Saturation: 68 %
O2 Saturation: 70 %
Potassium: 4.5 mmol/L (ref 3.5–5.1)
Potassium: 4.6 mmol/L (ref 3.5–5.1)
Sodium: 142 mmol/L (ref 135–145)
Sodium: 143 mmol/L (ref 135–145)
TCO2: 31 mmol/L (ref 22–32)
TCO2: 32 mmol/L (ref 22–32)
pCO2, Ven: 52.6 mmHg (ref 44.0–60.0)
pCO2, Ven: 54.4 mmHg (ref 44.0–60.0)
pH, Ven: 7.36 (ref 7.250–7.430)
pH, Ven: 7.362 (ref 7.250–7.430)
pO2, Ven: 38 mmHg (ref 32.0–45.0)
pO2, Ven: 39 mmHg (ref 32.0–45.0)

## 2020-09-18 LAB — GLUCOSE, CAPILLARY: Glucose-Capillary: 87 mg/dL (ref 70–99)

## 2020-09-18 SURGERY — RIGHT HEART CATH AND CORONARY ANGIOGRAPHY
Anesthesia: LOCAL

## 2020-09-18 MED ORDER — SODIUM CHLORIDE 0.9 % IV SOLN: INTRAVENOUS | Status: DC

## 2020-09-18 MED ORDER — HEPARIN (PORCINE) IN NACL 1000-0.9 UT/500ML-% IV SOLN
INTRAVENOUS | Status: AC
  Filled 2020-09-18: qty 1000

## 2020-09-18 MED ORDER — HEPARIN SODIUM (PORCINE) 1000 UNIT/ML IJ SOLN
INTRAMUSCULAR | Status: AC
  Filled 2020-09-18: qty 1

## 2020-09-18 MED ORDER — VERAPAMIL HCL 2.5 MG/ML IV SOLN
INTRAVENOUS | Status: AC
  Filled 2020-09-18: qty 2

## 2020-09-18 MED ORDER — HYDRALAZINE HCL 20 MG/ML IJ SOLN: 10.0000 mg | INTRAMUSCULAR | Status: DC | PRN

## 2020-09-18 MED ORDER — LABETALOL HCL 5 MG/ML IV SOLN: 10.0000 mg | INTRAVENOUS | Status: DC | PRN

## 2020-09-18 MED ORDER — SODIUM CHLORIDE 0.9% FLUSH: 3.0000 mL | INTRAVENOUS | Status: DC | PRN

## 2020-09-18 MED ORDER — SODIUM CHLORIDE 0.9% FLUSH: 3.0000 mL | Freq: Two times a day (BID) | INTRAVENOUS | Status: DC

## 2020-09-18 MED ORDER — HEPARIN (PORCINE) IN NACL 1000-0.9 UT/500ML-% IV SOLN
INTRAVENOUS | Status: DC | PRN
  Administered 2020-09-18 (×2): 500 mL

## 2020-09-18 MED ORDER — MIDAZOLAM HCL 2 MG/2ML IJ SOLN
INTRAMUSCULAR | Status: DC | PRN
  Administered 2020-09-18: 0.5 mg via INTRAVENOUS

## 2020-09-18 MED ORDER — ONDANSETRON HCL 4 MG/2ML IJ SOLN: 4.0000 mg | Freq: Four times a day (QID) | INTRAMUSCULAR | Status: DC | PRN

## 2020-09-18 MED ORDER — SODIUM CHLORIDE 0.9 % IV SOLN: 250.0000 mL | INTRAVENOUS | Status: DC | PRN

## 2020-09-18 MED ORDER — FENTANYL CITRATE (PF) 100 MCG/2ML IJ SOLN
INTRAMUSCULAR | Status: AC
  Filled 2020-09-18: qty 2

## 2020-09-18 MED ORDER — IOHEXOL 350 MG/ML SOLN
INTRAVENOUS | Status: DC | PRN
  Administered 2020-09-18: 50 mL via INTRA_ARTERIAL

## 2020-09-18 MED ORDER — LIDOCAINE HCL (PF) 1 % IJ SOLN
INTRAMUSCULAR | Status: AC
  Filled 2020-09-18: qty 30

## 2020-09-18 MED ORDER — ACETAMINOPHEN 325 MG PO TABS: 650.0000 mg | ORAL_TABLET | ORAL | Status: DC | PRN

## 2020-09-18 MED ORDER — FENTANYL CITRATE (PF) 100 MCG/2ML IJ SOLN
INTRAMUSCULAR | Status: DC | PRN
  Administered 2020-09-18: 12.5 ug via INTRAVENOUS

## 2020-09-18 MED ORDER — ASPIRIN 81 MG PO CHEW: 81.0000 mg | CHEWABLE_TABLET | ORAL | Status: DC

## 2020-09-18 MED ORDER — HEPARIN SODIUM (PORCINE) 1000 UNIT/ML IJ SOLN
INTRAMUSCULAR | Status: DC | PRN
  Administered 2020-09-18: 5000 [IU] via INTRAVENOUS

## 2020-09-18 MED ORDER — VERAPAMIL HCL 2.5 MG/ML IV SOLN
INTRAVENOUS | Status: DC | PRN
  Administered 2020-09-18: 10 mL via INTRA_ARTERIAL

## 2020-09-18 MED ORDER — MIDAZOLAM HCL 2 MG/2ML IJ SOLN
INTRAMUSCULAR | Status: AC
  Filled 2020-09-18: qty 2

## 2020-09-18 MED ORDER — LIDOCAINE HCL (PF) 1 % IJ SOLN
INTRAMUSCULAR | Status: DC | PRN
  Administered 2020-09-18 (×2): 2 mL

## 2020-09-18 SURGICAL SUPPLY — 10 items
CATH 5FR JL3.5 JR4 ANG PIG MP (CATHETERS) ×1 IMPLANT
CATH BALLN WEDGE 5F 110CM (CATHETERS) ×2 IMPLANT
DEVICE RAD COMP TR BAND LRG (VASCULAR PRODUCTS) ×1 IMPLANT
GLIDESHEATH SLEND SS 6F .021 (SHEATH) ×1 IMPLANT
GUIDEWIRE INQWIRE 1.5J.035X260 (WIRE) IMPLANT
INQWIRE 1.5J .035X260CM (WIRE) ×2
KIT HEART LEFT (KITS) ×2 IMPLANT
PACK CARDIAC CATHETERIZATION (CUSTOM PROCEDURE TRAY) ×2 IMPLANT
SHEATH GLIDE SLENDER 4/5FR (SHEATH) ×1 IMPLANT
TRANSDUCER W/STOPCOCK (MISCELLANEOUS) ×2 IMPLANT

## 2020-09-18 NOTE — Interval H&P Note (Signed)
History and Physical Interval Note:  09/18/2020 11:16 AM  Jenny Glover  has presented today for surgery, with the diagnosis of aortic stenosis.  The various methods of treatment have been discussed with the patient and family. After consideration of risks, benefits and other options for treatment, the patient has consented to  Procedure(s): RIGHT/LEFT HEART CATH AND CORONARY ANGIOGRAPHY (N/A) as a surgical intervention.  The patient's history has been reviewed, patient examined, no change in status, stable for surgery.  I have reviewed the patient's chart and labs.  Questions were answered to the patient's satisfaction.     Mischele Detter [employer]

## 2020-09-18 NOTE — Discharge Instructions (Signed)
She can restart Xarelto at 8 pm this evening.    Radial Site Care  This sheet gives you information about how to care for yourself after your procedure. Your health care provider may also give you more specific instructions. If you have problems or questions, contact your health care provider. What can I expect after the procedure? After the procedure, it is common to have:  Bruising and tenderness at the catheter insertion area. Follow these instructions at home: Medicines  Take over-the-counter and prescription medicines only as told by your health care provider. Insertion site care 1. Follow instructions from your health care provider about how to take care of your insertion site. Make sure you: ? Wash your hands with soap and water before you change your bandage (dressing). If soap and water are not available, use hand sanitizer. ? Change your dressing as told by your health care provider. 2. Check your insertion site every day for signs of infection. Check for: ? Redness, swelling, or pain. ? Fluid or blood. ? Pus or a bad smell. ? Warmth. 3. Do not take baths, swim, or use a hot tub for 5 days. 4. You may shower 24-48 hours after the procedure. ? Remove the dressing and gently wash the site with plain soap and water. ? Pat the area dry with a clean towel. ? Do not rub the site. That could cause bleeding. 5. Do not apply powder or lotion to the site. Activity  1. For 24 hours after the procedure, or as directed by your health care provider: ? Do not flex or bend the affected arm. ? Do not push or pull heavy objects with the affected arm. ? Do not drive yourself home from the hospital or clinic. You may drive 24 hours after the procedure. ? Do not operate machinery or power tools. ? KEEP ARM ELEVATED THE REMAINDER OF THE DAY. 2. Do not push, pull or lift anything that is heavier than 10 lb for 5 days. 3. Ask your health care provider when it is okay to: ? Return to work or  school. ? Resume usual physical activities or sports. ? Resume sexual activity. General instructions  If the catheter site starts to bleed, raise your arm and put firm pressure on the site. If the bleeding does not stop, get help right away. This is a medical emergency.  DRINK PLENTY OF FLUIDS FOR THE NEXT 2-3 DAYS.  If you went home on the same day as your procedure, a responsible adult should be with you for the first 24 hours after you arrive home.  Keep all follow-up visits as told by your health care provider. This is important. Contact a health care provider if:  You have a fever.  You have redness, swelling, or yellow drainage around your insertion site. Get help right away if:  You have unusual pain at the radial site.  The catheter insertion area swells very fast.  The insertion area is bleeding, and the bleeding does not stop when you hold steady pressure on the area.  Your arm or hand becomes pale, cool, tingly, or numb. These symptoms may represent a serious problem that is an emergency. Do not wait to see if the symptoms will go away. Get medical help right away. Call your local emergency services (911 in the [place] Do not drive yourself to the hospital. Summary  After the procedure, it is common to have bruising and tenderness at the site.  Follow instructions from your health care  provider about how to take care of your radial site wound. Check the wound every day for signs of infection.  Do not push, pull or lift anything that is heavier than 10 lb for 5 days.  This information is not intended to replace advice given to you by your health care provider. Make sure you discuss any questions you have with your health care provider. Document Revised: 10/06/2017 Document Reviewed: 10/06/2017 Elsevier Patient Education  2020 [employer].

## 2020-09-19 ENCOUNTER — Encounter (HOSPITAL_COMMUNITY): Payer: Self-pay | Admitting: Cardiology

## 2020-09-19 DIAGNOSIS — N390 Urinary tract infection, site not specified: Secondary | ICD-10-CM | POA: Diagnosis not present

## 2020-09-19 DIAGNOSIS — I130 Hypertensive heart and chronic kidney disease with heart failure and stage 1 through stage 4 chronic kidney disease, or unspecified chronic kidney disease: Secondary | ICD-10-CM | POA: Diagnosis not present

## 2020-09-19 DIAGNOSIS — I350 Nonrheumatic aortic (valve) stenosis: Secondary | ICD-10-CM | POA: Diagnosis not present

## 2020-09-19 DIAGNOSIS — J9601 Acute respiratory failure with hypoxia: Secondary | ICD-10-CM | POA: Diagnosis not present

## 2020-09-19 DIAGNOSIS — J189 Pneumonia, unspecified organism: Secondary | ICD-10-CM | POA: Diagnosis not present

## 2020-09-19 DIAGNOSIS — N1832 Chronic kidney disease, stage 3b: Secondary | ICD-10-CM | POA: Diagnosis not present

## 2020-09-19 DIAGNOSIS — B9620 Unspecified Escherichia coli [E. coli] as the cause of diseases classified elsewhere: Secondary | ICD-10-CM | POA: Diagnosis not present

## 2020-09-19 DIAGNOSIS — E1122 Type 2 diabetes mellitus with diabetic chronic kidney disease: Secondary | ICD-10-CM | POA: Diagnosis not present

## 2020-09-19 DIAGNOSIS — I5031 Acute diastolic (congestive) heart failure: Secondary | ICD-10-CM | POA: Diagnosis not present

## 2020-09-23 ENCOUNTER — Ambulatory Visit: Payer: Medicare HMO | Admitting: Cardiology

## 2020-09-24 ENCOUNTER — Ambulatory Visit: Payer: Medicare HMO | Admitting: Cardiovascular Disease

## 2020-09-24 ENCOUNTER — Encounter: Payer: Self-pay | Admitting: Cardiovascular Disease

## 2020-09-24 ENCOUNTER — Other Ambulatory Visit: Payer: Self-pay

## 2020-09-24 ENCOUNTER — Other Ambulatory Visit (HOSPITAL_COMMUNITY): Payer: Medicare HMO

## 2020-09-24 VITALS — BP 118/52 | HR 51 | Wt 244.0 lb

## 2020-09-24 DIAGNOSIS — I350 Nonrheumatic aortic (valve) stenosis: Secondary | ICD-10-CM | POA: Diagnosis not present

## 2020-09-24 DIAGNOSIS — E1122 Type 2 diabetes mellitus with diabetic chronic kidney disease: Secondary | ICD-10-CM | POA: Diagnosis not present

## 2020-09-24 DIAGNOSIS — B9620 Unspecified Escherichia coli [E. coli] as the cause of diseases classified elsewhere: Secondary | ICD-10-CM | POA: Diagnosis not present

## 2020-09-24 DIAGNOSIS — J189 Pneumonia, unspecified organism: Secondary | ICD-10-CM | POA: Diagnosis not present

## 2020-09-24 DIAGNOSIS — N1832 Chronic kidney disease, stage 3b: Secondary | ICD-10-CM | POA: Diagnosis not present

## 2020-09-24 DIAGNOSIS — N390 Urinary tract infection, site not specified: Secondary | ICD-10-CM | POA: Diagnosis not present

## 2020-09-24 DIAGNOSIS — J9601 Acute respiratory failure with hypoxia: Secondary | ICD-10-CM | POA: Diagnosis not present

## 2020-09-24 DIAGNOSIS — I5031 Acute diastolic (congestive) heart failure: Secondary | ICD-10-CM | POA: Diagnosis not present

## 2020-09-24 DIAGNOSIS — I130 Hypertensive heart and chronic kidney disease with heart failure and stage 1 through stage 4 chronic kidney disease, or unspecified chronic kidney disease: Secondary | ICD-10-CM | POA: Diagnosis not present

## 2020-09-24 MED ORDER — ATORVASTATIN CALCIUM 80 MG PO TABS: 80.0000 mg | ORAL_TABLET | Freq: Every day | ORAL | 1 refills | Status: DC

## 2020-09-24 NOTE — Patient Instructions (Addendum)
Medication Instructions:  No changes *If you need a refill on your cardiac medications before your next appointment, please call your pharmacy*   Lab Work: Today: BMET If you have labs (blood work) drawn today and your tests are completely normal, you will receive your results only by: Marland Kitchen MyChart Message (if you have MyChart) OR . A paper copy in the mail If you have any lab test that is abnormal or we need to change your treatment, we will call you to review the results.   Other Instructions Theodosia Quay, RN, [facility] Nurse Navigator will contact you regarding next steps in testing.

## 2020-09-24 NOTE — Progress Notes (Signed)
[facility] Consult Note  Chief Complaint  Patient presents with  . New Patient (Initial Visit)    Severe aortic stenosis    History of Present Illness: 75 yo female with history of permanent atrial fibrillation, prior CVA, chronic kidney disease stage 3, diastolic CHF, dementia, sleep apnea, diabetes mellitus, hyperlipidemia, HTN, CAD and severe aortic stenosis who is here today as a new consult, referred by Dr. Aundra Dubin, for further discussion regarding her aortic stenosis and possible TAVR. She is known to have CAD with prior stenting of the PDA in 2013. Cardiac cath 09/18/20 with mild disease in the LAD and Circumflex, moderate diagonal stenosis, patent PDA stent, mild disease in mid RCA. She has permanent atrial fibrillation on Xarelto. Stroke in 2016. She has moderate dementia with poor short term memory. She has been followed for moderate aortic stenosis by Dr. Agustin Cree. She was admitted in November 2021 with dyspnea and was found to have acute CHF. She was diuresed with IV Lasix. Echo November 2021 with LVEF=60-65%, mild LVH. The aortic valve leaflets are thickened and calcified with poor leaflet mobility. Mean gradient 34.8 mmHg, peak gradient 61.7mmHg, AVA 0.76 cm2, dimensionless index 0.25. This is consistent with severe aortic stenosis.   She tells me today that she has been doing well overall. She denies dyspnea or chest pain. Her husband and daughter are with her and do most of the talking today. Her husband tells me that she sits in the house all day. She does not cook or clean. He takes care of her. She is exhausted after doing anything. She has had no lower extremity edema since being discharged from the hospital in November. She has been taking Lasix twice daily. Her daughter thinks her memory issues are becoming more severe. Her husband denies seeing a change in this over the past year. She typically knows where she is and who family members are. She has recently been  thinking her youngest daughter is still 66. She ambulates with a walker.   She is a retired Marine scientist. She lives in [place], [place] with her husband. She has not seen a dentist recently. Her family states that she has issues with her teeth and recently had a tooth break off.   Primary Care Physician: Reynold Bowen, MD Primary Cardiologist: Loralie Champagne Referring Cardiologist: Loralie Champagne  Past Medical History:  Diagnosis Date  . Anxiety   . Aortic stenosis   . Atrial fibrillation ([place])    s/p DCCV 2005, 2006. Placed on Tikosyn 2006 (negative adenosine cardiolite 03/2005, could not measure EF).  . CAD (coronary artery disease)   . CHF (congestive heart failure) ([place])   . Depression   . Diabetes mellitus without complication ([place])   . Diabetic foot ulcer ([place])   . High cholesterol   . History of cardioversion 2005/2006  . Hyperlipemia   . Hypertension   . Mental status change   . Neuropathy   . Obesity   . Skin cancer of face    treated with radiation  . Sleep apnea     Past Surgical History:  Procedure Laterality Date  . INTRAMEDULLARY (IM) NAIL INTERTROCHANTERIC Right 08/19/2019   Procedure: INTRAMEDULLARY (IM) NAIL INTERTROCHANTRIC;  Surgeon: Rod Can, MD;  Location: [facility];  Service: Orthopedics;  Laterality: Right;  . LEFT HEART CATHETERIZATION WITH CORONARY ANGIOGRAM N/A 08/23/2012   Procedure: LEFT HEART CATHETERIZATION WITH CORONARY ANGIOGRAM;  Surgeon: Jettie Booze, MD;  Location[facility] C[facility] LAB;  Service: Cardiovascular;  Laterality: N/A;  .  PERCUTANEOUS CORONARY STENT INTERVENTION (PCI-S)  08/23/2012   Procedure: PERCUTANEOUS CORONARY STENT INTERVENTION (PCI-S);  Surgeon: Jettie Booze, MD;  Location: [facility] CATH LAB;  Service: Cardiovascular;;  . RIGHT HEART CATH AND CORONARY ANGIOGRAPHY N/A 09/18/2020   Procedure: RIGHT HEART CATH AND CORONARY ANGIOGRAPHY;  Surgeon: Larey Dresser, MD;  Location: [facility] CV LAB;  Service: Cardiovascular;  Laterality: N/A;   . SKIN CANCER EXCISION    . TENOTOMY      Current Outpatient Medications  Medication Sig Dispense Refill  . acetaminophen (TYLENOL) 500 MG tablet Take 1 tablet (500 mg total) by mouth every 6 (six) hours as needed (pain). (Patient taking differently: Take 1,000 mg by mouth every 6 (six) hours as needed (pain).) 30 tablet 0  . amLODipine (NORVASC) 5 MG tablet Take 5 mg by mouth daily.    Marland Kitchen atorvastatin (LIPITOR) 80 MG tablet Take 1 tablet (80 mg total) by mouth daily. Needs appointment with Dr. Raliegh Ip for further refills. 30 tablet 0  . Camphor-Eucalyptus-Menthol (VICKS VAPORUB EX) Apply 1 application topically at bedtime.    . Cholecalciferol (VITAMIN D) 50 MCG (2000 UT) tablet Take 2,000 Units by mouth daily.    Marland Kitchen escitalopram (LEXAPRO) 20 MG tablet Take 20 mg by mouth daily.    . furosemide (LASIX) 40 MG tablet Take 1 tablet (40 mg total) by mouth in the morning AND 0.5 tablets (20 mg total) every evening. 45 tablet 3  . insulin aspart (NOVOLOG) 100 UNIT/ML injection Inject 10 Units into the skin 3 (three) times daily before meals.    . insulin glargine (LANTUS) 100 UNIT/ML injection Inject 45 Units into the skin daily.    . memantine (NAMENDA) 5 MG tablet Take 5 mg 2 (two) times daily by mouth.    . metoprolol succinate (TOPROL-XL) 100 MG 24 hr tablet Take 100 mg by mouth daily. Take with or immediately following a meal.    . Rivaroxaban (XARELTO) 20 MG TABS Take 1 tablet (20 mg total) by mouth daily. 30 tablet 0   No current facility-administered medications for this visit.    No Known Allergies  Social History   Socioeconomic History  . Marital status: Married    Spouse name: Not on file  . Number of children: 3  . Years of education: College   . Highest education level: Not on file  Occupational History  . Occupation: retired Marine scientist  Tobacco Use  . Smoking status: Never Smoker  . Smokeless tobacco: Never Used  Vaping Use  . Vaping Use: Never used  Substance and Sexual  Activity  . Alcohol use: No    Alcohol/week: 0.0 standard drinks  . Drug use: No  . Sexual activity: Not on file  Other Topics Concern  . Not on file  Social History Narrative   Occasionally drinks coffee, tea, soda    Social Determinants of Health   Financial Resource Strain: Not on file  Food Insecurity: Not on file  Transportation Needs: Not on file  Physical Activity: Not on file  Stress: Not on file  Social Connections: Not on file  Intimate Partner Violence: Not on file    Family History  Problem Relation Age of Onset  . Diabetes Father   . Coronary artery disease Father   . Stroke Mother     Review of Systems:  As stated in the HPI and otherwise negative.   BP (!) 118/52 (BP Location: Right Arm, Patient Position: Sitting, Cuff Size: Large)   Pulse Marland Kitchen)  51   Wt 244 lb (110.7 kg)   SpO2 96%   BMI 35.01 kg/m   Physical Examination: General: Well developed, well nourished, NAD  HEENT: OP clear, mucus membranes moist  SKIN: warm, dry. No rashes. Neuro: No focal deficits  Musculoskeletal: Muscle strength 5/5 all ext  Psychiatric: Mood and affect normal  Neck: No JVD, no carotid bruits, no thyromegaly, no lymphadenopathy.  Lungs:Clear bilaterally, no wheezes, rhonci, crackles Cardiovascular: Regular rate and rhythm. Loud, harsh, late peaking systolic murmur.  Abdomen:Soft. Bowel sounds present. Non-tender.  Extremities: No lower extremity edema. Pulses are 2 + in the bilateral DP/PT.  EKG:  EKG is not ordered today. The ekg ordered today demonstrates  Echo 08/09/20: 1. Left ventricular ejection fraction, by estimation, is 60 to 65%. The  left ventricle has normal function. The left ventricle has no regional  wall motion abnormalities. There is mild concentric left ventricular  hypertrophy. Left ventricular diastolic  function could not be evaluated.  2. Right ventricular systolic function is normal. The right ventricular  size is normal. There is severely  elevated pulmonary artery systolic  pressure.  3. Left atrial size was severely dilated.  4. The mitral valve is normal in structure. Mild mitral valve  regurgitation. No evidence of mitral stenosis.  5. The aortic valve is tricuspid. There is moderate calcification of the  aortic valve. There is severe thickening of the aortic valve. Aortic valve  regurgitation is trivial. Severe aortic valve stenosis.  6. The inferior vena cava is dilated in size with <50% respiratory  variability, suggesting right atrial pressure of 15 mmHg.   Comparison(s): Prior images reviewed side by side. Aortic stenosis has  worsened and is now severe.   FINDINGS  Left Ventricle: Left ventricular ejection fraction, by estimation, is 60  to 65%. The left ventricle has normal function. The left ventricle has no  regional wall motion abnormalities. The left ventricular internal cavity  size was normal in size. There is  mild concentric left ventricular hypertrophy. Left ventricular diastolic  function could not be evaluated due to atrial fibrillation. Left  ventricular diastolic function could not be evaluated.   Right Ventricle: The right ventricular size is normal. No increase in  right ventricular wall thickness. Right ventricular systolic function is  normal. There is severely elevated pulmonary artery systolic pressure. The  tricuspid regurgitant velocity is  3.76 m/s, and with an assumed right atrial pressure of 15 mmHg, the  estimated right ventricular systolic pressure is 95.6 mmHg.   Left Atrium: Left atrial size was severely dilated.   Right Atrium: Right atrial size was normal in size.   Pericardium: There is no evidence of pericardial effusion.   Mitral Valve: The mitral valve is normal in structure. Mild to moderate  mitral annular calcification. Mild mitral valve regurgitation. No evidence  of mitral valve stenosis.   Tricuspid Valve: The tricuspid valve is normal in structure.  Tricuspid  valve regurgitation is trivial.   Aortic Valve: The aortic valve is tricuspid. There is moderate  calcification of the aortic valve. There is severe thickening of the  aortic valve. Aortic valve regurgitation is trivial. Aortic regurgitation  PHT measures 561 msec. Severe aortic stenosis is  present. Aortic valve mean gradient measures 34.8 mmHg. Aortic valve peak  gradient measures 61.8 mmHg. Aortic valve area, by VTI measures 0.79 cm.   Pulmonic Valve: The pulmonic valve was grossly normal. Pulmonic valve  regurgitation is trivial.   Aorta: The aortic root is normal in  size and structure.   Venous: The inferior vena cava is dilated in size with less than 50%  respiratory variability, suggesting right atrial pressure of 15 mmHg.   IAS/Shunts: No atrial level shunt detected by color flow Doppler.     LEFT VENTRICLE  PLAX 2D  LVIDd:     4.70 cm  LVIDs:     2.50 cm  LV PW:     1.30 cm  LV IVS:    1.40 cm  LVOT diam:   2.00 cm  LV SV:     84  LV SV Index:  37  LVOT Area:   3.14 cm     RIGHT VENTRICLE  RV S prime:   13.80 cm/s  TAPSE (M-mode): 2.0 cm   LEFT ATRIUM       Index    RIGHT ATRIUM      Index  LA diam:    4.30 cm 1.90 cm/m RA Area:   25.80 cm  LA Vol (A2C):  121.0 ml 53.33 ml/m RA Volume:  80.70 ml 35.57 ml/m  LA Vol (A4C):  112.0 ml 49.36 ml/m  LA Biplane Vol: 118.0 ml 52.01 ml/m  AORTIC VALVE  AV Area (Vmax):  0.76 cm  AV Area (Vmean):  0.85 cm  AV Area (VTI):   0.79 cm  AV Vmax:      393.00 cm/s  AV Vmean:     260.726 cm/s  AV VTI:      1.057 m  AV Peak Grad:   61.8 mmHg  AV Mean Grad:   34.8 mmHg  LVOT Vmax:     94.60 cm/s  LVOT Vmean:    70.900 cm/s  LVOT VTI:     0.267 m  LVOT/AV VTI ratio: 0.25  AI PHT:      561 msec    AORTA  Ao Root diam: 3.80 cm   MITRAL VALVE        TRICUSPID VALVE  MV Area (PHT): 3.72 cm    TR Peak grad:  56.6 mmHg  MV Decel Time: 204 msec   TR Vmax:    376.00 cm/s  MV E velocity: 180.00 cm/s               SHUNTS               Systemic VTI: 0.27 m               Systemic Diam: 2.00 cm   Cardiac cath 09/18/20:  1. Nonobstructive coronary disease.  2. Clear severe AS on echo, aortic valve not crossed.  3. Mildly elevated PCWP with prominent v-waves (suspect due to diastolic dysfunction).  4. Moderate pulmonary venous hypertension.  Diagnostic   Left Main  No significant disease.  Left Anterior Descending  Luminal irregularities in the LAD. 60% stenosis in the mid D1, moderate vessel.  Left Circumflex  Luminal irregularities.  Right Coronary Artery  30-40% mid RCA stenosis. Stent in proximal PDA with 30% in-stent restenosis.    Right Heart Pressures RHC Procedural Findings: Hemodynamics (mmHg) RA mean 4 RV 56/2 PA 57/17, mean 34 PCWP mean 19 (prominent v-waves to 35 mmHg)  Oxygen saturations: PA 69% AO 100%  Cardiac Output (Fick) 5.88  Cardiac Index (Fick) 2.56 PVR 2.6 WU     Recent Labs: 08/08/2020: ALT 13; B Natriuretic Peptide 418.0 08/11/2020: Magnesium 1.8 09/12/2020: BUN 30; Creatinine, Ser 1.37; Platelets 237 09/18/2020: Hemoglobin 11.9; Hemoglobin 12.2; Potassium 4.5; Potassium 4.6; Sodium 143; Sodium 142  Wt Readings from Last 3 Encounters:  09/24/20 244 lb (110.7 kg)  09/18/20 250 lb (113.4 kg)  09/12/20 246 lb 9.6 oz (111.9 kg)     Other studies Reviewed: Additional studies/ records that were reviewed today include: Echo images, cath films, office notes.  Review of the above records demonstrates: severe AS   Assessment and Plan:   1. Severe Aortic Valve Stenosis: She has severe, stage D aortic valve stenosis. I have personally reviewed the echo images. The aortic valve is thickened, calcified with limited leaflet mobility. I think she would benefit from AVR. I am concerned about  her dementia. She is very limited at home and sits in a chair all day. She ambulates with a walker. Her daughter notes that her memory issues seem to be worsening. Given advanced age and significant memory issues, she is definitely not a good candidate for conventional AVR by surgical approach. At this time, I am not sure she will be a candidate for TAVR either. I have explained to her family that her memory issues may worsen after the sedation protocol and after the TAVR procedure. Her husband would like to proceed with planning for TAVR at this time.    STS Risk Score: Risk of Mortality: 1.972% Renal Failure: 3.133% Permanent Stroke: 1.301% Prolonged Ventilation: 7.540% DSW Infection: 0.194% Reoperation: 2.832% Morbidity or Mortality: 11.983% Short Length of Stay: 27.547% Long Length of Stay: 6.563%   I have reviewed the natural history of aortic stenosis with the patient and their family members  who are present today. We have discussed the limitations of medical therapy and the poor prognosis associated with symptomatic aortic stenosis. We have reviewed potential treatment options, including palliative medical therapy, conventional surgical aortic valve replacement, and transcatheter aortic valve replacement. We discussed treatment options in the context of the patient's specific comorbid medical conditions.   She would like to proceed with planning for TAVR. Risks and benefits of the valve procedure are reviewed with the patient. We will arrange a cardiac CT, CTA of the chest/abdomen and pelvis, carotid artery dopplers, PT assessment. I will review her case with the valve team and we will then make a decision on whether or not to proceed. As above, the main limiting factor will be her dementia. If we decided to proceed with planning for TAVR after her scans, will arrange a visit with one of the CT surgeons on our TAVR team.      Current medicines are reviewed at length with the  patient today.  The patient does not have concerns regarding medicines.  The following changes have been made:  no change  Labs/ tests ordered today include:  No orders of the defined types were placed in this encounter.   Disposition:   F/U with the valve team.    Signed, Lauree Chandler, MD 09/24/2020 2:27 PM    [address], [place], [place]  [address] Phone: [phone]; Fax: [phone]

## 2020-09-25 ENCOUNTER — Telehealth (HOSPITAL_COMMUNITY): Payer: Self-pay

## 2020-09-25 LAB — BASIC METABOLIC PANEL
BUN/Creatinine Ratio: 28 (ref 12–28)
BUN: 40 mg/dL — ABNORMAL HIGH (ref 8–27)
CO2: 26 mmol/L (ref 20–29)
Calcium: 9.8 mg/dL (ref 8.7–10.3)
Chloride: 104 mmol/L (ref 96–106)
Creatinine, Ser: 1.45 mg/dL — ABNORMAL HIGH (ref 0.57–1.00)
GFR calc Af Amer: 41 mL/min/{1.73_m2} — ABNORMAL LOW (ref >59)
GFR calc non Af Amer: 36 mL/min/{1.73_m2} — ABNORMAL LOW (ref >59)
Glucose: 132 mg/dL — ABNORMAL HIGH (ref 65–99)
Potassium: 5.5 mmol/L — ABNORMAL HIGH (ref 3.5–5.2)
Sodium: 145 mmol/L — ABNORMAL HIGH (ref 134–144)

## 2020-09-25 MED ORDER — FUROSEMIDE 40 MG PO TABS: 40.0000 mg | ORAL_TABLET | Freq: Every morning | ORAL | 3 refills | Status: DC

## 2020-09-25 NOTE — Telephone Encounter (Signed)
-----   Message from Larey Dresser, MD sent at 09/25/2020  8:23 AM EST ----- Decrease torsemide to 40 mg daily, follow low K diet, make sure no K supplement.  BMET 1 week.

## 2020-09-25 NOTE — Telephone Encounter (Signed)
Patients husband, Elenore Rota advised and verbalized understanding.  Med list updated to reflect changes. Patient has a pending appt on 10/03/20.

## 2020-09-26 ENCOUNTER — Encounter: Payer: Self-pay | Admitting: Physician Assistant

## 2020-09-26 ENCOUNTER — Other Ambulatory Visit: Payer: Self-pay

## 2020-09-26 ENCOUNTER — Encounter (HOSPITAL_COMMUNITY): Payer: Medicare HMO | Admitting: Cardiology

## 2020-09-26 ENCOUNTER — Other Ambulatory Visit: Payer: Self-pay | Admitting: Physician Assistant

## 2020-09-26 DIAGNOSIS — I350 Nonrheumatic aortic (valve) stenosis: Secondary | ICD-10-CM

## 2020-09-27 ENCOUNTER — Other Ambulatory Visit: Payer: Self-pay | Admitting: Physician Assistant

## 2020-09-30 ENCOUNTER — Telehealth: Payer: Self-pay

## 2020-09-30 NOTE — Telephone Encounter (Signed)
The pt is scheduled for TAVR testing on 1/20.  The pt's daughter, Seth Bake, called because she tested positive for Covid on 09/28/2020 and she provides the pt with transportation.  Seth Bake was last around the pt on 1/13 and at this time the pt has not developed any symptoms or undergone Covid swabbing. I have rescheduled the pt's TAVR test to 1/28 and moved her evaluation with Dr Cyndia Bent to 2/2.

## 2020-10-01 DIAGNOSIS — I350 Nonrheumatic aortic (valve) stenosis: Secondary | ICD-10-CM | POA: Diagnosis not present

## 2020-10-01 DIAGNOSIS — N1832 Chronic kidney disease, stage 3b: Secondary | ICD-10-CM | POA: Diagnosis not present

## 2020-10-01 DIAGNOSIS — J9601 Acute respiratory failure with hypoxia: Secondary | ICD-10-CM | POA: Diagnosis not present

## 2020-10-01 DIAGNOSIS — B9620 Unspecified Escherichia coli [E. coli] as the cause of diseases classified elsewhere: Secondary | ICD-10-CM | POA: Diagnosis not present

## 2020-10-01 DIAGNOSIS — I130 Hypertensive heart and chronic kidney disease with heart failure and stage 1 through stage 4 chronic kidney disease, or unspecified chronic kidney disease: Secondary | ICD-10-CM | POA: Diagnosis not present

## 2020-10-01 DIAGNOSIS — E1122 Type 2 diabetes mellitus with diabetic chronic kidney disease: Secondary | ICD-10-CM | POA: Diagnosis not present

## 2020-10-01 DIAGNOSIS — J189 Pneumonia, unspecified organism: Secondary | ICD-10-CM | POA: Diagnosis not present

## 2020-10-01 DIAGNOSIS — N390 Urinary tract infection, site not specified: Secondary | ICD-10-CM | POA: Diagnosis not present

## 2020-10-01 DIAGNOSIS — I5031 Acute diastolic (congestive) heart failure: Secondary | ICD-10-CM | POA: Diagnosis not present

## 2020-10-03 ENCOUNTER — Other Ambulatory Visit (HOSPITAL_COMMUNITY): Payer: Medicare HMO

## 2020-10-03 ENCOUNTER — Ambulatory Visit (HOSPITAL_COMMUNITY): Admission: RE | Admit: 2020-10-03 | Payer: Medicare HMO | Source: Ambulatory Visit

## 2020-10-03 ENCOUNTER — Encounter (HOSPITAL_COMMUNITY): Payer: Medicare HMO

## 2020-10-04 ENCOUNTER — Other Ambulatory Visit: Payer: Self-pay | Admitting: Cardiology

## 2020-10-04 NOTE — Telephone Encounter (Signed)
We made several attempt and sent fill with  notifications for the patient to arrange an appt with Dr. Agustin Cree for additional refill. He last visit was on 07/08/2018. Request denied until seen in our office.

## 2020-10-07 DIAGNOSIS — I5031 Acute diastolic (congestive) heart failure: Secondary | ICD-10-CM | POA: Diagnosis not present

## 2020-10-07 DIAGNOSIS — E1122 Type 2 diabetes mellitus with diabetic chronic kidney disease: Secondary | ICD-10-CM | POA: Diagnosis not present

## 2020-10-07 DIAGNOSIS — N1832 Chronic kidney disease, stage 3b: Secondary | ICD-10-CM | POA: Diagnosis not present

## 2020-10-07 DIAGNOSIS — J9601 Acute respiratory failure with hypoxia: Secondary | ICD-10-CM | POA: Diagnosis not present

## 2020-10-07 DIAGNOSIS — N390 Urinary tract infection, site not specified: Secondary | ICD-10-CM | POA: Diagnosis not present

## 2020-10-07 DIAGNOSIS — I130 Hypertensive heart and chronic kidney disease with heart failure and stage 1 through stage 4 chronic kidney disease, or unspecified chronic kidney disease: Secondary | ICD-10-CM | POA: Diagnosis not present

## 2020-10-07 DIAGNOSIS — J189 Pneumonia, unspecified organism: Secondary | ICD-10-CM | POA: Diagnosis not present

## 2020-10-07 DIAGNOSIS — B9620 Unspecified Escherichia coli [E. coli] as the cause of diseases classified elsewhere: Secondary | ICD-10-CM | POA: Diagnosis not present

## 2020-10-07 DIAGNOSIS — I350 Nonrheumatic aortic (valve) stenosis: Secondary | ICD-10-CM | POA: Diagnosis not present

## 2020-10-08 DIAGNOSIS — H5200 Hypermetropia, unspecified eye: Secondary | ICD-10-CM | POA: Diagnosis not present

## 2020-10-08 DIAGNOSIS — Z0100 Encounter for examination of eyes and vision without abnormal findings: Secondary | ICD-10-CM | POA: Diagnosis not present

## 2020-10-08 DIAGNOSIS — E119 Type 2 diabetes mellitus without complications: Secondary | ICD-10-CM | POA: Diagnosis not present

## 2020-10-08 DIAGNOSIS — H25813 Combined forms of age-related cataract, bilateral: Secondary | ICD-10-CM | POA: Diagnosis not present

## 2020-10-09 ENCOUNTER — Encounter: Payer: Medicare HMO | Admitting: Surgery

## 2020-10-10 ENCOUNTER — Telehealth: Payer: Self-pay

## 2020-10-10 NOTE — Telephone Encounter (Signed)
Received message from [facility] that the patient's daughter left a message with questions about CT tomorrow.  Attempted to call number left [phone]). Left message to call [facility] # [phone]. Left message she will be called in the AM.

## 2020-10-11 ENCOUNTER — Other Ambulatory Visit: Payer: Self-pay

## 2020-10-11 ENCOUNTER — Ambulatory Visit (HOSPITAL_COMMUNITY): Payer: Medicare HMO

## 2020-10-11 ENCOUNTER — Ambulatory Visit (HOSPITAL_COMMUNITY)
Admission: RE | Admit: 2020-10-11 | Discharge: 2020-10-11 | Disposition: A | Discharge status: Home / Self Care | Payer: Medicare HMO | Source: Ambulatory Visit | Attending: Cardiovascular Disease | Admitting: Cardiovascular Disease

## 2020-10-11 ENCOUNTER — Ambulatory Visit (HOSPITAL_COMMUNITY)
Admission: RE | Admit: 2020-10-11 | Discharge: 2020-10-11 | Disposition: A | Discharge status: Home / Self Care | Payer: Medicare HMO | Source: Ambulatory Visit | Attending: Physician Assistant | Admitting: Physician Assistant

## 2020-10-11 DIAGNOSIS — I350 Nonrheumatic aortic (valve) stenosis: Secondary | ICD-10-CM

## 2020-10-11 DIAGNOSIS — N2889 Other specified disorders of kidney and ureter: Secondary | ICD-10-CM | POA: Diagnosis not present

## 2020-10-11 DIAGNOSIS — I700 Atherosclerosis of aorta: Secondary | ICD-10-CM | POA: Diagnosis not present

## 2020-10-11 LAB — BASIC METABOLIC PANEL
Anion gap: 10 (ref 5–15)
BUN: 39 mg/dL — ABNORMAL HIGH (ref 8–23)
CO2: 27 mmol/L (ref 22–32)
Calcium: 9.1 mg/dL (ref 8.9–10.3)
Chloride: 103 mmol/L (ref 98–111)
Creatinine, Ser: 1.49 mg/dL — ABNORMAL HIGH (ref 0.44–1.00)
GFR, Estimated: 37 mL/min — ABNORMAL LOW (ref >60)
Glucose, Bld: 52 mg/dL — ABNORMAL LOW (ref 70–99)
Potassium: 3.9 mmol/L (ref 3.5–5.1)
Sodium: 140 mmol/L (ref 135–145)

## 2020-10-11 MED ORDER — IOHEXOL 350 MG/ML SOLN
100.0000 mL | Freq: Once | INTRAVENOUS | Status: AC | PRN
  Administered 2020-10-11: 100 mL via INTRAVENOUS

## 2020-10-11 MED ORDER — SODIUM CHLORIDE 0.9 % WEIGHT BASED INFUSION
3.0000 mL/kg/h | INTRAVENOUS | Status: AC
  Administered 2020-10-11: 3 mL/kg/h via INTRAVENOUS

## 2020-10-11 MED ORDER — SODIUM CHLORIDE 0.9 % WEIGHT BASED INFUSION: 1.0000 mL/kg/h | INTRAVENOUS | Status: DC

## 2020-10-11 NOTE — Telephone Encounter (Signed)
Pt here at the hospital for CT scans. Will close note.

## 2020-10-16 ENCOUNTER — Encounter: Payer: Self-pay | Admitting: Surgery

## 2020-10-16 ENCOUNTER — Other Ambulatory Visit: Payer: Self-pay

## 2020-10-16 ENCOUNTER — Institutional Professional Consult (permissible substitution): Payer: Medicare HMO | Admitting: Surgery

## 2020-10-16 VITALS — BP 149/66 | HR 70 | Resp 20 | Ht 70.0 in | Wt 250.0 lb

## 2020-10-16 DIAGNOSIS — I350 Nonrheumatic aortic (valve) stenosis: Secondary | ICD-10-CM

## 2020-10-16 DIAGNOSIS — N390 Urinary tract infection, site not specified: Secondary | ICD-10-CM | POA: Diagnosis not present

## 2020-10-16 DIAGNOSIS — E1122 Type 2 diabetes mellitus with diabetic chronic kidney disease: Secondary | ICD-10-CM | POA: Diagnosis not present

## 2020-10-16 DIAGNOSIS — B9620 Unspecified Escherichia coli [E. coli] as the cause of diseases classified elsewhere: Secondary | ICD-10-CM | POA: Diagnosis not present

## 2020-10-16 DIAGNOSIS — I5031 Acute diastolic (congestive) heart failure: Secondary | ICD-10-CM | POA: Diagnosis not present

## 2020-10-16 DIAGNOSIS — I130 Hypertensive heart and chronic kidney disease with heart failure and stage 1 through stage 4 chronic kidney disease, or unspecified chronic kidney disease: Secondary | ICD-10-CM | POA: Diagnosis not present

## 2020-10-16 DIAGNOSIS — J189 Pneumonia, unspecified organism: Secondary | ICD-10-CM | POA: Diagnosis not present

## 2020-10-16 DIAGNOSIS — N1832 Chronic kidney disease, stage 3b: Secondary | ICD-10-CM | POA: Diagnosis not present

## 2020-10-16 DIAGNOSIS — J9601 Acute respiratory failure with hypoxia: Secondary | ICD-10-CM | POA: Diagnosis not present

## 2020-10-16 NOTE — Progress Notes (Signed)
Patient ID: Jenny Glover, female   DOB: [birth year], 75 y.o.   MRN: [id number]  [facility] CONSULTATION REPORT  Referring Provider is Larey Dresser, MD Primary Cardiologist is Jenne Campus, MD PCP is Reynold Bowen, MD  Chief Complaint  Patient presents with  . Aortic Stenosis    Initial surgical consult. CTA chest/abd/pel/morph 1/28, cath 1/5, ECHO 08/09/20    HPI:  The patient is a 75 year old woman with a history of diabetes, hyperlipidemia, hypertension permanent atrial fibrillation on Xarelto, prior stroke, dementia, stage III chronic kidney disease, coronary artery disease status post PCI, severe aortic stenosis, and diastolic congestive heart failure who was referred for consideration of aortic valve replacement.  She had stenting of her PDA in 2013.  Her most recent catheterization on 09/18/2020 showed mild disease in the LAD and left circumflex with moderate diagonal stenosis and a patent PDA stent.  She has been followed by Dr. Agustin Cree with moderate aortic stenosis and was admitted in November 2021 with shortness of breath and diagnosed with acute congestive heart failure.  She was diuresed with IV Lasix and continued on oral Lasix.  A 2D echocardiogram in November 2021 showed an ejection fraction of 60 to 65% with a calcified and thickened aortic valve with poor leaflet mobility.  The mean gradient was 35 mmHg with a peak of 62 mmHg.  Aortic valve area was 0.76 cm with a dimensionless index of 0.25 consistent with severe aortic stenosis.  The patient is here today with her husband with her daughter conferenced and on the phone.  Her husband and daughter did most of the talking.  Her husband reports that she is very inactive and sits in the house all day.  She is poorly mobile and needs help to get up out of the chair or bed and walks slowly with a walker.  She has had several falls.  She uses a  bedside commode which her husband takes care of.  She does not cook or clean and her husband said that he provides 24-hour care for her.  She denies any chest pain or pressure.  She denies any dizziness or syncope.  Her husband concurs.  She denies any peripheral edema.  Her husband and daughter report significant dementia with short-term memory loss and seems to be better in the morning and later in the day.  She is a retired Marine scientist and continues to give herself insulin although her husband manages her other medications.  She has not seen a dentist in years and has several broken off teeth.  Past Medical History:  Diagnosis Date  . Anxiety   . Aortic stenosis   . Atrial fibrillation ([place])    s/p DCCV 2005, 2006. Placed on Tikosyn 2006 (negative adenosine cardiolite 03/2005, could not measure EF).  . CAD (coronary artery disease)   . CHF (congestive heart failure) ([place])   . Depression   . Diabetes mellitus without complication ([place])   . Diabetic foot ulcer ([place])   . High cholesterol   . History of cardioversion 2005/2006  . Hyperlipemia   . Hypertension   . Mental status change   . Neuropathy   . Obesity   . Skin cancer of face    treated with radiation  . Sleep apnea     Past Surgical History:  Procedure Laterality Date  . INTRAMEDULLARY (IM) NAIL INTERTROCHANTERIC Right 08/19/2019   Procedure: INTRAMEDULLARY (IM) NAIL INTERTROCHANTRIC;  Surgeon: Rod Can,  MD;  Location: [facility];  Service: Orthopedics;  Laterality: Right;  . LEFT HEART CATHETERIZATION WITH CORONARY ANGIOGRAM N/A 08/23/2012   Procedure: LEFT HEART CATHETERIZATION WITH CORONARY ANGIOGRAM;  Surgeon: Jettie Booze, MD;  Location: [facility] CATH LAB;  Service: Cardiovascular;  Laterality: N/A;  . PERCUTANEOUS CORONARY STENT INTERVENTION (PCI-S)  08/23/2012   Procedure: PERCUTANEOUS CORONARY STENT INTERVENTION (PCI-S);  Surgeon: Jettie Booze, MD;  Location: [facility] CATH LAB;  Service: Cardiovascular;;  . RIGHT HEART  CATH AND CORONARY ANGIOGRAPHY N/A 09/18/2020   Procedure: RIGHT HEART CATH AND CORONARY ANGIOGRAPHY;  Surgeon: Larey Dresser, MD;  Location: [facility] CV LAB;  Service: Cardiovascular;  Laterality: N/A;  . SKIN CANCER EXCISION    . TENOTOMY      Family History  Problem Relation Age of Onset  . Diabetes Father   . Coronary artery disease Father   . Stroke Mother     Social History   Socioeconomic History  . Marital status: Married    Spouse name: Not on file  . Number of children: 3  . Years of education: College   . Highest education level: Not on file  Occupational History  . Occupation: retired Marine scientist  Tobacco Use  . Smoking status: Never Smoker  . Smokeless tobacco: Never Used  Vaping Use  . Vaping Use: Never used  Substance and Sexual Activity  . Alcohol use: No    Alcohol/week: 0.0 standard drinks  . Drug use: No  . Sexual activity: Not on file  Other Topics Concern  . Not on file  Social History Narrative   Occasionally drinks coffee, tea, soda    Social Determinants of Health   Financial Resource Strain: Not on file  Food Insecurity: Not on file  Transportation Needs: Not on file  Physical Activity: Not on file  Stress: Not on file  Social Connections: Not on file  Intimate Partner Violence: Not on file    Current Outpatient Medications  Medication Sig Dispense Refill  . acetaminophen (TYLENOL) 500 MG tablet Take 1 tablet (500 mg total) by mouth every 6 (six) hours as needed (pain). (Patient taking differently: Take 1,000 mg by mouth every 6 (six) hours as needed (pain).) 30 tablet 0  . amLODipine (NORVASC) 5 MG tablet Take 5 mg by mouth daily.    Marland Kitchen atorvastatin (LIPITOR) 80 MG tablet Take 1 tablet (80 mg total) by mouth daily. 90 tablet 1  . Camphor-Eucalyptus-Menthol (VICKS VAPORUB EX) Apply 1 application topically at bedtime.    . Cholecalciferol (VITAMIN D) 50 MCG (2000 UT) tablet Take 2,000 Units by mouth daily.    Marland Kitchen escitalopram (LEXAPRO) 20 MG  tablet Take 20 mg by mouth daily.    . furosemide (LASIX) 40 MG tablet Take 1 tablet (40 mg total) by mouth in the morning. 90 tablet 3  . insulin aspart (NOVOLOG) 100 UNIT/ML injection Inject 10 Units into the skin 3 (three) times daily before meals.    . insulin glargine (LANTUS) 100 UNIT/ML injection Inject 45 Units into the skin daily.    . memantine (NAMENDA) 5 MG tablet Take 5 mg 2 (two) times daily by mouth.    . metoprolol succinate (TOPROL-XL) 100 MG 24 hr tablet Take 100 mg by mouth daily. Take with or immediately following a meal.    . Rivaroxaban (XARELTO) 20 MG TABS Take 1 tablet (20 mg total) by mouth daily. 30 tablet 0   No current facility-administered medications for this visit.    No Known  Allergies    Review of Systems:   General:  Normal appetite, + decreased energy, no weight gain, no weight loss, no fever  Cardiac:  no chest pain with exertion, no chest pain at rest, +SOB with  exertion, no resting SOB, no PND, no orthopnea, no palpitations, + arrhythmia, + atrial fibrillation, no LE edema, no dizzy spells, no syncope  Respiratory:  + exertional shortness of breath, no home oxygen, no productive cough, no dry cough, no bronchitis, no wheezing, no hemoptysis, no asthma, no pain with inspiration or cough, + sleep apnea, no CPAP at night  GI:   no difficulty swallowing, no reflux, no frequent heartburn, no hiatal hernia, no abdominal pain, no constipation, no diarrhea, no hematochezia, no hematemesis, no melena  GU:   no dysuria,  no frequency, no urinary tract infection, no hematuria, no kidney stones, + chronic kidney disease  Vascular:  no pain suggestive of claudication, no pain in feet, no leg cramps, no varicose veins, no DVT, no non-healing foot ulcer  Neuro:   + stroke, no TIA's, no seizures, no headaches, no temporary blindness one eye,  no slurred speech, no peripheral neuropathy, no chronic pain, + instability of gait, + memory/cognitive  dysfunction  Musculoskeletal: + arthritis, + joint swelling, no myalgias, + difficulty walking, + decreased mobility   Skin:   no rash, no itching, no skin infections, no pressure sores or ulcerations  Psych:   no anxiety, + depression, no nervousness, no unusual recent stress  Eyes:   no blurry vision, no floaters, no recent vision changes, + wears glasses or contacts  ENT:   no hearing loss, + loose or painful teeth, no dentures, last saw dentist years ago  Hematologic:  + easy bruising, no abnormal bleeding, no clotting disorder, no frequent epistaxis  Endocrine:  + diabetes, does check CBG's at home      Physical Exam:   BP (!) 149/66 (BP Location: Right Arm, Patient Position: Sitting)   Pulse 70   Resp 20   Ht 5\' 10"  (1.778 m)   Wt 250 lb (113.4 kg)   SpO2 91% Comment: RA with mask on  BMI 35.87 kg/m   General:  Obese, well-appearing  HEENT:  Unremarkable, NCAT, PERLA, EOMI  Neck:   no JVD, no bruits, no adenopathy   Chest:   clear to auscultation, symmetrical breath sounds, no wheezes, no rhonchi   CV:   RRR, grade lll/VI crescendo/decrescendo murmur heard best at RSB,  no diastolic murmur  Abdomen:  soft, non-tender, no masses   Extremities:  warm, well-perfused, pulses palpable at ankle, mild bilateral LE edema  Rectal/GU  Deferred  Neuro:   Flat affect, dementia apparent, grossly non-focal and symmetrical throughout  Skin:   Clean and dry, no rashes, no breakdown   Diagnostic Tests:  Patient Name:  HELI DINO Date of Exam: 08/09/2020  Medical Rec #: [id number]   Height:    70.0 in  Accession #:  [id number]   Weight:    243.4 lb  Date of Birth: 01/14/1946   BSA:     2.269 m  Patient Age:  57 years    BP:      169/68 mmHg  Patient Gender: F       HR:      54 bpm.  Exam Location: [facility]   Procedure: 2D Echo, Cardiac Doppler and Color Doppler   Indications:  CHF-Acute Diastolic 967.59 / F63.84    History:     Patient  has prior history of Echocardiogram examinations,  most         recent 07/22/2018. CAD, Arrythmias:Atrial Fibrillation;  Risk         Factors:Hypertension, Diabetes and Dyslipidemia. Aortic         stenosis, OSA on CPAP, ARF (acute renal failure).    Sonographer:  Alvino Chapel RCS  Referring Phys: [phone] DAVID TAT   IMPRESSIONS    1. Left ventricular ejection fraction, by estimation, is 60 to 65%. The  left ventricle has normal function. The left ventricle has no regional  wall motion abnormalities. There is mild concentric left ventricular  hypertrophy. Left ventricular diastolic  function could not be evaluated.  2. Right ventricular systolic function is normal. The right ventricular  size is normal. There is severely elevated pulmonary artery systolic  pressure.  3. Left atrial size was severely dilated.  4. The mitral valve is normal in structure. Mild mitral valve  regurgitation. No evidence of mitral stenosis.  5. The aortic valve is tricuspid. There is moderate calcification of the  aortic valve. There is severe thickening of the aortic valve. Aortic valve  regurgitation is trivial. Severe aortic valve stenosis.  6. The inferior vena cava is dilated in size with <50% respiratory  variability, suggesting right atrial pressure of 15 mmHg.   Comparison(s): Prior images reviewed side by side. Aortic stenosis has  worsened and is now severe.   FINDINGS  Left Ventricle: Left ventricular ejection fraction, by estimation, is 60  to 65%. The left ventricle has normal function. The left ventricle has no  regional wall motion abnormalities. The left ventricular internal cavity  size was normal in size. There is  mild concentric left ventricular hypertrophy. Left ventricular diastolic  function could not be evaluated due to atrial fibrillation. Left  ventricular diastolic function could not be evaluated.   Right Ventricle: The right  ventricular size is normal. No increase in  right ventricular wall thickness. Right ventricular systolic function is  normal. There is severely elevated pulmonary artery systolic pressure. The  tricuspid regurgitant velocity is  3.76 m/s, and with an assumed right atrial pressure of 15 mmHg, the  estimated right ventricular systolic pressure is 37.9 mmHg.   Left Atrium: Left atrial size was severely dilated.   Right Atrium: Right atrial size was normal in size.   Pericardium: There is no evidence of pericardial effusion.   Mitral Valve: The mitral valve is normal in structure. Mild to moderate  mitral annular calcification. Mild mitral valve regurgitation. No evidence  of mitral valve stenosis.   Tricuspid Valve: The tricuspid valve is normal in structure. Tricuspid  valve regurgitation is trivial.   Aortic Valve: The aortic valve is tricuspid. There is moderate  calcification of the aortic valve. There is severe thickening of the  aortic valve. Aortic valve regurgitation is trivial. Aortic regurgitation  PHT measures 561 msec. Severe aortic stenosis is  present. Aortic valve mean gradient measures 34.8 mmHg. Aortic valve peak  gradient measures 61.8 mmHg. Aortic valve area, by VTI measures 0.79 cm.   Pulmonic Valve: The pulmonic valve was grossly normal. Pulmonic valve  regurgitation is trivial.   Aorta: The aortic root is normal in size and structure.   Venous: The inferior vena cava is dilated in size with less than 50%  respiratory variability, suggesting right atrial pressure of 15 mmHg.   IAS/Shunts: No atrial level shunt detected by color flow Doppler.     LEFT VENTRICLE  PLAX 2D  LVIDd:  4.70 cm  LVIDs:     2.50 cm  LV PW:     1.30 cm  LV IVS:    1.40 cm  LVOT diam:   2.00 cm  LV SV:     84  LV SV Index:  37  LVOT Area:   3.14 cm     RIGHT VENTRICLE  RV S prime:   13.80 cm/s  TAPSE (M-mode): 2.0 cm   LEFT ATRIUM        Index    RIGHT ATRIUM      Index  LA diam:    4.30 cm 1.90 cm/m RA Area:   25.80 cm  LA Vol (A2C):  121.0 ml 53.33 ml/m RA Volume:  80.70 ml 35.57 ml/m  LA Vol (A4C):  112.0 ml 49.36 ml/m  LA Biplane Vol: 118.0 ml 52.01 ml/m  AORTIC VALVE  AV Area (Vmax):  0.76 cm  AV Area (Vmean):  0.85 cm  AV Area (VTI):   0.79 cm  AV Vmax:      393.00 cm/s  AV Vmean:     260.726 cm/s  AV VTI:      1.057 m  AV Peak Grad:   61.8 mmHg  AV Mean Grad:   34.8 mmHg  LVOT Vmax:     94.60 cm/s  LVOT Vmean:    70.900 cm/s  LVOT VTI:     0.267 m  LVOT/AV VTI ratio: 0.25  AI PHT:      561 msec    AORTA  Ao Root diam: 3.80 cm   MITRAL VALVE        TRICUSPID VALVE  MV Area (PHT): 3.72 cm   TR Peak grad:  56.6 mmHg  MV Decel Time: 204 msec   TR Vmax:    376.00 cm/s  MV E velocity: 180.00 cm/s               SHUNTS               Systemic VTI: 0.27 m               Systemic Diam: 2.00 cm   Dani Gobble Croitoru MD  Electronically signed by Sanda Klein MD  Signature Date/Time: 08/09/2020/5:02:47 PM      Final   Physicians  Panel Physicians Referring Physician Case Authorizing Physician  Larey Dresser, MD (Primary)      Procedures  RIGHT HEART CATH AND CORONARY ANGIOGRAPHY   Conclusion  1. Nonobstructive coronary disease.  2. Clear severe AS on echo, aortic valve not crossed.  3. Mildly elevated PCWP with prominent v-waves (suspect due to diastolic dysfunction).  4. Moderate pulmonary venous hypertension.   Will refer for structural heart evaluation.    Procedural Details  Technical Details Procedure: Right Heart Cath, Selective Coronary Angiography  Indication: Aortic stenosis.    Procedural Details: The right brachial and radial areas were prepped, draped, and anesthetized with 1% lidocaine. There was a pre-existing peripheral IV that  was replaced with a 269F venous sheath. A Swan-Ganz catheter was used for the right heart catheterization. Standard protocol was followed for recording of right heart pressures and sampling of oxygen saturations. Fick cardiac output was calculated. The right radial artery was entered using modified Seldinger technique and a 69F sheath was placed.  The patient received 3 mg IA verapamil and weight-based IV heparin.  Standard Judkins catheters were used for selective coronary angiography. There were no immediate procedural complications. The patient was transferred to the post catheterization  recovery area for further monitoring.    Estimated blood loss <50 mL.   During this procedure medications were administered to achieve and maintain moderate conscious sedation while the patient's heart rate, blood pressure, and oxygen saturation were continuously monitored and I was present face-to-face 100% of this time.   Medications (Filter: Administrations occurring from 1045 to 1153 on 09/18/20) (important) Continuous medications are totaled by the amount administered until 09/18/20 1153.    Heparin (Porcine) in NaCl 1000-0.9 UT/500ML-% SOLN (mL) Total volume:  1,000 mL  Date/Time Rate/Dose/Volume Action   09/18/20 1115 500 mL Given   1115 500 mL Given    lidocaine (PF) (XYLOCAINE) 1 % injection (mL) Total volume:  4 mL  Date/Time Rate/Dose/Volume Action   09/18/20 1122 2 mL Given   1127 2 mL Given    midazolam (VERSED) injection (mg) Total dose:  0.5 mg  Date/Time Rate/Dose/Volume Action   09/18/20 1129 0.5 mg Given    fentaNYL (SUBLIMAZE) injection (mcg) Total dose:  12.5 mcg  Date/Time Rate/Dose/Volume Action   09/18/20 1129 12.5 mcg Given    Radial Cocktail/Verapamil only (mL) Total volume:  10 mL  Date/Time Rate/Dose/Volume Action   09/18/20 1130 10 mL Given    heparin sodium (porcine) injection (Units) Total dose:  5,000 Units  Date/Time Rate/Dose/Volume Action   09/18/20  1131 5,000 Units Given    iohexol (OMNIPAQUE) 350 MG/ML injection (mL) Total volume:  50 mL  Date/Time Rate/Dose/Volume Action   09/18/20 1146 50 mL Given    0.9 % sodium chloride infusion (mL/hr) Total volume:  10.41 mL Dosing weight:  111.9  Date/Time Rate/Dose/Volume Action   09/18/20 1051 10 mL/hr New Bag/Given    aspirin chewable tablet 81 mg (mg) Total dose:  0 mg Dosing weight:  111.9  Date/Time Rate/Dose/Volume Action   09/18/20 1051  Canceled Entry    Sedation Time  Sedation Time Physician-1: 13 minutes 6 seconds   Contrast  Medication Name Total Dose  iohexol (OMNIPAQUE) 350 MG/ML injection 50 mL    Radiation/Fluoro  Fluoro time: 6 (min) DAP: 24580 (mGycm2) Cumulative Air Kerma: 998 (mGy)   Coronary Findings   Diagnostic Dominance: Right  Left Main  No significant disease.  Left Anterior Descending  Luminal irregularities in the LAD. 60% stenosis in the mid D1, moderate vessel.  Left Circumflex  Luminal irregularities.  Right Coronary Artery  30-40% mid RCA stenosis. Stent in proximal PDA with 30% in-stent restenosis.   Intervention   No interventions have been documented.  Right Heart  Right Heart Pressures RHC Procedural Findings: Hemodynamics (mmHg) RA mean 4 RV 56/2 PA 57/17, mean 34 PCWP mean 19 (prominent v-waves to 35 mmHg)  Oxygen saturations: PA 69% AO 100%  Cardiac Output (Fick) 5.88  Cardiac Index (Fick) 2.56 PVR 2.6 WU   Implants    No implant documentation for this case.   Syngo Images  Show images for CARDIAC CATHETERIZATION  Images on Long Term Storage  Show images for Rosalin, Buster to Procedure Log  Procedure Log     Hemo Data  Flowsheet Row Most Recent Value  Fick Cardiac Output 5.88 L/min  Fick Cardiac Output Index 2.56 (L/min)/BSA  RA A Wave 6 mmHg  RA V Wave 7 mmHg  RA Mean 4 mmHg  RV Systolic Pressure 56 mmHg  RV Diastolic Pressure -2 mmHg  RV EDP 2 mmHg  PA Systolic  Pressure 57 mmHg  PA Diastolic Pressure 17 mmHg  PA Mean 34 mmHg  PW A Wave 16 mmHg  PW V Wave 35 mmHg  PW Mean 19 mmHg  AO Systolic Pressure 809 mmHg  AO Diastolic Pressure 57 mmHg  AO Mean 91 mmHg  QP/QS 1  TPVR Index 13.25 HRUI  TSVR Index 35.46 HRUI  PVR SVR Ratio 0.17  TPVR/TSVR Ratio 0.37    ADDENDUM REPORT: 10/13/2020 15:03  CLINICAL DATA:  Aortic stenosis  EXAM: Cardiac TAVR CT  TECHNIQUE: The patient was scanned on a Siemens Force 983 slice scanner. A 120 kV retrospective scan was triggered in the descending thoracic aorta at 111 HU's. Gantry rotation speed was 270 msecs and collimation was .9 mm. No beta blockade or nitro were given. The 3D data set was reconstructed in 5% intervals of the R-R cycle. Systolic and diastolic phases were analyzed on a dedicated work station using MPR, MIP and VRT modes. The patient received 100 cc of contrast.  FINDINGS: Aortic Valve: Severely thickened aortic valve with heavy calcification and reduced excursion the planimeter valve area is 0.918 sq cm consistent with severe aortic stenosis  Number of leaflets: 3  LVOT calcification: Moderate  Annular calcification: Moderate  Calcium Score: 3722  Prosthetic Valve: N/A  Mitral Valve: Moderate to severe mitral annular calcification noted.  Aortic Annulus Measurements  Major annulus diameter: 28.9  mm  Minor annulus diameter:20.2  mm  Annular perimeter: 79.6  mm  Annular area: 4.70  cm2  Aortic Root Measurements  Sinus of Valsalva perimeter: 158 mm  Sinotubular Junction: 30.8 mm  Ascending Thoracic Aorta: 33.9 mm  Aortic Arch: 28.6 mm; aortic atherosclerosis noted  Descending Thoracic Aorta: 31.9 mm  Main pulmonary Artery: 42 mm  Sinus of Valsalva Measurements:  Right coronary cusp width: 34.8  mm  Left coronary cusp width: 40.7 mm  Non coronary cusp width: 38.8 mm  Non-Right coronary cusp width: 31.5 mm  Right-  Left-coronary cusp width: 24.2 mm  Left- Non- coronary cusp width: 25.0 mm  Coronary Artery Height above Annulus:  Left Main: 18.5 mm  Right Coronary: 23.7 mm  Coronary Arteries: Dense calcium and prior stent noted. Nitroglycerin not administered for this assessment.  Coronary Artery Calcium score: Deferred in the setting of known obstructive disease  Optimum Fluoroscopic Angle for Delivery: LAO 13; CAU 17  IMPRESSION: 1. Severe aortic stenosis. Findings pertinent to TAVR procedure are detailed above, including notable LVOT calcification.  2. Coronary arteries calcifications with prior stent noted.  3. Patient's total coronary artery calcium score deferred in the setting of known obstructive disease.  4. Main pulmonary artery dilation at 42 mm.  5. Aortic atherosclerosis noted.  Mahesh  Chandrasekhar   Electronically Signed   By: Rudean Haskell MD   On: 10/13/2020 15:03  Narrative & Impression  CLINICAL DATA:  75 year old female with history of severe aortic stenosis. Preprocedural study prior to potential transcatheter aortic valve replacement (TAVR) procedure.  EXAM: CT ANGIOGRAPHY CHEST, ABDOMEN AND PELVIS  TECHNIQUE: Multidetector CT imaging through the chest, abdomen and pelvis was performed using the standard protocol during bolus administration of intravenous contrast. Multiplanar reconstructed images and MIPs were obtained and reviewed to evaluate the vascular anatomy.  CONTRAST:  156mL OMNIPAQUE IOHEXOL 350 MG/ML SOLN  COMPARISON:  CT the chest, abdomen and pelvis 08/21/2012.  FINDINGS: CTA CHEST FINDINGS  Cardiovascular: Heart size is mildly enlarged. There is no significant pericardial fluid, thickening or pericardial calcification. There is aortic atherosclerosis, as well as atherosclerosis of the great vessels of the mediastinum and the coronary arteries, including  calcified atherosclerotic plaque in the left  main, left anterior descending, left circumflex and right coronary arteries. Severe calcifications of the aortic valve. Moderate to severe calcifications of the mitral annulus. Aberrant right subclavian artery (normal anatomical variant) incidentally noted.  Mediastinum/Lymph Nodes: No pathologically enlarged mediastinal or hilar lymph nodes. Esophagus is unremarkable in appearance. No axillary lymphadenopathy.  Lungs/Pleura: Patchy areas of ground-glass attenuation and mild interlobular septal thickening scattered throughout the lungs bilaterally, likely reflective of a background of mild interstitial pulmonary edema, potentially with some air trapping from small airways disease. No acute consolidative airspace disease. No pleural effusions. No suspicious appearing pulmonary nodules or masses are noted.  Musculoskeletal/Soft Tissues: There are no aggressive appearing lytic or blastic lesions noted in the visualized portions of the skeleton.  CTA ABDOMEN AND PELVIS FINDINGS  Hepatobiliary: Liver has a shrunken appearance and nodular contour, indicative of underlying cirrhosis. No discrete cystic or solid hepatic lesions. No intra or extrahepatic biliary ductal dilatation. Gallbladder is normal in appearance.  Pancreas: No pancreatic mass dilatation. No pancreatic No pancreatic ductal or peripancreatic fluid collections or inflammatory changes.  Spleen: Unremarkable.  Adrenals/Urinary Tract: Multiple subcentimeter low-attenuation lesions in both kidneys, too small to characterize, but statistically likely to represent tiny cysts. In addition, there is a 4.9 cm peripelvic cyst associated with the left kidney. No hydroureteronephrosis. Urinary bladder is normal in appearance. Bilateral adrenal glands are normal in appearance.  Stomach/Bowel: Normal appearance of the stomach. No pathologic dilatation of small bowel or colon. The appendix is not confidently identified  and may be surgically absent. Regardless, there are no inflammatory changes noted adjacent to the cecum to suggest the presence of an acute appendicitis at this time.  Vascular/Lymphatic: Aortic atherosclerosis, without evidence of aneurysm or dissection in the abdominal or pelvic vasculature. No lymphadenopathy noted in the abdomen or pelvis.  Reproductive: Uterus and ovaries are atrophic.  Other: No significant volume of ascites.  No pneumoperitoneum.  Musculoskeletal: There are no aggressive appearing lytic or blastic lesions noted in the visualized portions of the skeleton. Orthopedic fixation hardware in the proximal right femur with extensive posttraumatic deformity.  VASCULAR MEASUREMENTS PERTINENT TO TAVR:  AORTA:  Minimal Aortic Diameter-15 x 11 mm  Severity of Aortic Calcification-moderate  RIGHT PELVIS:  Right Common Iliac Artery -  Minimal Diameter-11.1 x 7.4 mm  Tortuosity-mild  Calcification-mild  Right External Iliac Artery -  Minimal Diameter-7.7 x 7.0 mm  Tortuosity - mild  Calcification-none  Right Common Femoral Artery -  Minimal Diameter-7.0 x 6.1 mm  Tortuosity - mild  Calcification-mild  LEFT PELVIS:  Left Common Iliac Artery -  Minimal Diameter-9.8 x 8.9 mm  Tortuosity - mild  Calcification-mild  Left External Iliac Artery -  Minimal Diameter-7.9 x 7.1 mm  Tortuosity - mild  Calcification-minimal  Left Common Femoral Artery -  Minimal Diameter-6.1 x 6.8 mm  Tortuosity - mild  Calcification-none  Review of the MIP images confirms the above findings.  IMPRESSION: 1. Vascular findings and measurements pertinent to potential TAVR procedure, as detailed above. 2. Severe thickening calcification of the aortic valve, compatible with reported clinical history of severe aortic stenosis. 3. Aortic atherosclerosis, in addition to left main and 3 vessel coronary artery disease.  Assessment for potential risk factor modification, dietary therapy or pharmacologic therapy may be warranted, if clinically indicated. 4. Moderate to severe calcifications of the mitral annulus. 5. Cirrhosis. 6. Aberrant right subclavian artery (normal anatomical variant) incidentally noted. 7. Additional incidental findings, as above.   Electronically Signed  By: Vinnie Langton M.D.   On: 10/11/2020 14:43    STS Risk Score: Risk of Mortality: 1.972% Renal Failure: 3.133% Permanent Stroke: 1.301% Prolonged Ventilation: 7.540% DSW Infection: 0.194% Reoperation: 2.832% Morbidity or Mortality: 11.983% Short Length of Stay: 27.547% Long Length of Stay: 6.563%   Impression:  This 75 year old woman has stage D, severe, symptomatic aortic stenosis with New York Heart Association class II symptoms of exertional fatigue and shortness of breath consistent with chronic diastolic congestive heart failure.  I personally reviewed her 2D echocardiogram, cardiac catheterization, and CTA studies.  Her echocardiogram shows a trileaflet aortic valve with moderate calcification and severe thickening with restricted leaflet mobility.  The mean gradient is 35 mmHg with a peak gradient of 62 mmHg and a valve area of 0.79 cm consistent with severe aortic stenosis.  Left ventricular ejection fraction is 60 to 65%.  Her cardiac catheterization showed nonobstructive coronary disease with moderate pulmonary hypertension with a PA pressure 57/17 and a mean of 34.  Pulmonary capillary wedge pressure was mean 19 with prominent V waves to 35 mmHg.  According to her husband and daughter she is very inactive and just sits around the house all day with very little physical activity.  She has degenerative arthritis and morbid obesity and requires help to get up out of a chair or out of bed and cannot walk without a walker which she does very slowly.  She has had multiple falls.  She also has significant  dementia with short-term memory loss and confusion.  Her husband said this worsens as the day goes on.  This was apparent during our visit with some of the things that she was saying.  I do not think she would be a candidate for open surgical aortic valve replacement and I would be very concerned about performing TAVR in this patient with significant dementia.  In our experience patients with dementia have had significant worsening after TAVR and I suspect that is due to the combination of anesthesia and possibly cerebral emboli from the procedure.  She is barely getting by from a functional standpoint at this time and requires the care of her husband 24 hours a day.  I am very concerned that this may worsen if we perform TAVR.  I discussed this with the patient's husband and daughter by telephone conference and answered all the questions.  They said they were also concerned about the effects that TAVR may have on her dementia.  Her gated cardiac CTA shows anatomy suitable for TAVR using a SAPIEN 3 valve although there is significant calcification extending down into the LVOT that would increase the risk of perivalvular leak.  Her abdominal and pelvic CTA shows adequate pelvic vascular anatomy to allow transfemoral insertion.  After discussion with the patient's husband and daughter we have all decided not to proceed with TAVR at this time.  I spent 50 minutes performing this consultation and > 50% of this time was spent face to face counseling and coordinating the care of this patient's severe aortic stenosis.   Plan:  She will continue to follow-up with Dr. Forde Dandy, Dr. Agustin Cree and Dr. Aundra Dubin.     Gaye Pollack, MD 10/16/2020

## 2020-10-18 DIAGNOSIS — N1832 Chronic kidney disease, stage 3b: Secondary | ICD-10-CM | POA: Diagnosis not present

## 2020-10-18 DIAGNOSIS — I130 Hypertensive heart and chronic kidney disease with heart failure and stage 1 through stage 4 chronic kidney disease, or unspecified chronic kidney disease: Secondary | ICD-10-CM | POA: Diagnosis not present

## 2020-10-18 DIAGNOSIS — F0390 Unspecified dementia without behavioral disturbance: Secondary | ICD-10-CM | POA: Diagnosis not present

## 2020-10-18 DIAGNOSIS — I5031 Acute diastolic (congestive) heart failure: Secondary | ICD-10-CM | POA: Diagnosis not present

## 2020-10-18 DIAGNOSIS — J189 Pneumonia, unspecified organism: Secondary | ICD-10-CM | POA: Diagnosis not present

## 2020-10-18 DIAGNOSIS — J9601 Acute respiratory failure with hypoxia: Secondary | ICD-10-CM | POA: Diagnosis not present

## 2020-10-18 DIAGNOSIS — E1122 Type 2 diabetes mellitus with diabetic chronic kidney disease: Secondary | ICD-10-CM | POA: Diagnosis not present

## 2020-10-18 DIAGNOSIS — N390 Urinary tract infection, site not specified: Secondary | ICD-10-CM | POA: Diagnosis not present

## 2020-10-18 DIAGNOSIS — I482 Chronic atrial fibrillation, unspecified: Secondary | ICD-10-CM | POA: Diagnosis not present

## 2020-10-22 DIAGNOSIS — E1122 Type 2 diabetes mellitus with diabetic chronic kidney disease: Secondary | ICD-10-CM | POA: Diagnosis not present

## 2020-10-22 DIAGNOSIS — N1832 Chronic kidney disease, stage 3b: Secondary | ICD-10-CM | POA: Diagnosis not present

## 2020-10-22 DIAGNOSIS — N390 Urinary tract infection, site not specified: Secondary | ICD-10-CM | POA: Diagnosis not present

## 2020-10-22 DIAGNOSIS — I130 Hypertensive heart and chronic kidney disease with heart failure and stage 1 through stage 4 chronic kidney disease, or unspecified chronic kidney disease: Secondary | ICD-10-CM | POA: Diagnosis not present

## 2020-10-22 DIAGNOSIS — I5031 Acute diastolic (congestive) heart failure: Secondary | ICD-10-CM | POA: Diagnosis not present

## 2020-10-22 DIAGNOSIS — I482 Chronic atrial fibrillation, unspecified: Secondary | ICD-10-CM | POA: Diagnosis not present

## 2020-10-22 DIAGNOSIS — J9601 Acute respiratory failure with hypoxia: Secondary | ICD-10-CM | POA: Diagnosis not present

## 2020-10-22 DIAGNOSIS — J189 Pneumonia, unspecified organism: Secondary | ICD-10-CM | POA: Diagnosis not present

## 2020-10-22 DIAGNOSIS — F0390 Unspecified dementia without behavioral disturbance: Secondary | ICD-10-CM | POA: Diagnosis not present

## 2020-10-24 DIAGNOSIS — N390 Urinary tract infection, site not specified: Secondary | ICD-10-CM | POA: Diagnosis not present

## 2020-10-24 DIAGNOSIS — F0390 Unspecified dementia without behavioral disturbance: Secondary | ICD-10-CM | POA: Diagnosis not present

## 2020-10-24 DIAGNOSIS — J9601 Acute respiratory failure with hypoxia: Secondary | ICD-10-CM | POA: Diagnosis not present

## 2020-10-24 DIAGNOSIS — E1122 Type 2 diabetes mellitus with diabetic chronic kidney disease: Secondary | ICD-10-CM | POA: Diagnosis not present

## 2020-10-24 DIAGNOSIS — J189 Pneumonia, unspecified organism: Secondary | ICD-10-CM | POA: Diagnosis not present

## 2020-10-24 DIAGNOSIS — I5031 Acute diastolic (congestive) heart failure: Secondary | ICD-10-CM | POA: Diagnosis not present

## 2020-10-24 DIAGNOSIS — N1832 Chronic kidney disease, stage 3b: Secondary | ICD-10-CM | POA: Diagnosis not present

## 2020-10-24 DIAGNOSIS — I130 Hypertensive heart and chronic kidney disease with heart failure and stage 1 through stage 4 chronic kidney disease, or unspecified chronic kidney disease: Secondary | ICD-10-CM | POA: Diagnosis not present

## 2020-10-24 DIAGNOSIS — I482 Chronic atrial fibrillation, unspecified: Secondary | ICD-10-CM | POA: Diagnosis not present

## 2020-10-29 DIAGNOSIS — N1832 Chronic kidney disease, stage 3b: Secondary | ICD-10-CM | POA: Diagnosis not present

## 2020-10-29 DIAGNOSIS — J189 Pneumonia, unspecified organism: Secondary | ICD-10-CM | POA: Diagnosis not present

## 2020-10-29 DIAGNOSIS — N390 Urinary tract infection, site not specified: Secondary | ICD-10-CM | POA: Diagnosis not present

## 2020-10-29 DIAGNOSIS — F0390 Unspecified dementia without behavioral disturbance: Secondary | ICD-10-CM | POA: Diagnosis not present

## 2020-10-29 DIAGNOSIS — J9601 Acute respiratory failure with hypoxia: Secondary | ICD-10-CM | POA: Diagnosis not present

## 2020-10-29 DIAGNOSIS — I130 Hypertensive heart and chronic kidney disease with heart failure and stage 1 through stage 4 chronic kidney disease, or unspecified chronic kidney disease: Secondary | ICD-10-CM | POA: Diagnosis not present

## 2020-10-29 DIAGNOSIS — I482 Chronic atrial fibrillation, unspecified: Secondary | ICD-10-CM | POA: Diagnosis not present

## 2020-10-29 DIAGNOSIS — I5031 Acute diastolic (congestive) heart failure: Secondary | ICD-10-CM | POA: Diagnosis not present

## 2020-10-29 DIAGNOSIS — E1122 Type 2 diabetes mellitus with diabetic chronic kidney disease: Secondary | ICD-10-CM | POA: Diagnosis not present

## 2020-10-31 DIAGNOSIS — J189 Pneumonia, unspecified organism: Secondary | ICD-10-CM | POA: Diagnosis not present

## 2020-10-31 DIAGNOSIS — I5031 Acute diastolic (congestive) heart failure: Secondary | ICD-10-CM | POA: Diagnosis not present

## 2020-10-31 DIAGNOSIS — N390 Urinary tract infection, site not specified: Secondary | ICD-10-CM | POA: Diagnosis not present

## 2020-10-31 DIAGNOSIS — I482 Chronic atrial fibrillation, unspecified: Secondary | ICD-10-CM | POA: Diagnosis not present

## 2020-10-31 DIAGNOSIS — E1122 Type 2 diabetes mellitus with diabetic chronic kidney disease: Secondary | ICD-10-CM | POA: Diagnosis not present

## 2020-10-31 DIAGNOSIS — F0390 Unspecified dementia without behavioral disturbance: Secondary | ICD-10-CM | POA: Diagnosis not present

## 2020-10-31 DIAGNOSIS — I130 Hypertensive heart and chronic kidney disease with heart failure and stage 1 through stage 4 chronic kidney disease, or unspecified chronic kidney disease: Secondary | ICD-10-CM | POA: Diagnosis not present

## 2020-10-31 DIAGNOSIS — N1832 Chronic kidney disease, stage 3b: Secondary | ICD-10-CM | POA: Diagnosis not present

## 2020-10-31 DIAGNOSIS — J9601 Acute respiratory failure with hypoxia: Secondary | ICD-10-CM | POA: Diagnosis not present

## 2020-11-05 DIAGNOSIS — E1122 Type 2 diabetes mellitus with diabetic chronic kidney disease: Secondary | ICD-10-CM | POA: Diagnosis not present

## 2020-11-05 DIAGNOSIS — I130 Hypertensive heart and chronic kidney disease with heart failure and stage 1 through stage 4 chronic kidney disease, or unspecified chronic kidney disease: Secondary | ICD-10-CM | POA: Diagnosis not present

## 2020-11-05 DIAGNOSIS — I5031 Acute diastolic (congestive) heart failure: Secondary | ICD-10-CM | POA: Diagnosis not present

## 2020-11-05 DIAGNOSIS — I482 Chronic atrial fibrillation, unspecified: Secondary | ICD-10-CM | POA: Diagnosis not present

## 2020-11-05 DIAGNOSIS — N390 Urinary tract infection, site not specified: Secondary | ICD-10-CM | POA: Diagnosis not present

## 2020-11-05 DIAGNOSIS — F0390 Unspecified dementia without behavioral disturbance: Secondary | ICD-10-CM | POA: Diagnosis not present

## 2020-11-05 DIAGNOSIS — N1832 Chronic kidney disease, stage 3b: Secondary | ICD-10-CM | POA: Diagnosis not present

## 2020-11-05 DIAGNOSIS — J189 Pneumonia, unspecified organism: Secondary | ICD-10-CM | POA: Diagnosis not present

## 2020-11-05 DIAGNOSIS — J9601 Acute respiratory failure with hypoxia: Secondary | ICD-10-CM | POA: Diagnosis not present

## 2020-11-07 ENCOUNTER — Encounter (HOSPITAL_COMMUNITY): Payer: Medicare HMO

## 2020-11-08 DIAGNOSIS — J189 Pneumonia, unspecified organism: Secondary | ICD-10-CM | POA: Diagnosis not present

## 2020-11-08 DIAGNOSIS — N1832 Chronic kidney disease, stage 3b: Secondary | ICD-10-CM | POA: Diagnosis not present

## 2020-11-08 DIAGNOSIS — E1122 Type 2 diabetes mellitus with diabetic chronic kidney disease: Secondary | ICD-10-CM | POA: Diagnosis not present

## 2020-11-08 DIAGNOSIS — I5031 Acute diastolic (congestive) heart failure: Secondary | ICD-10-CM | POA: Diagnosis not present

## 2020-11-08 DIAGNOSIS — N390 Urinary tract infection, site not specified: Secondary | ICD-10-CM | POA: Diagnosis not present

## 2020-11-08 DIAGNOSIS — I482 Chronic atrial fibrillation, unspecified: Secondary | ICD-10-CM | POA: Diagnosis not present

## 2020-11-08 DIAGNOSIS — J9601 Acute respiratory failure with hypoxia: Secondary | ICD-10-CM | POA: Diagnosis not present

## 2020-11-08 DIAGNOSIS — F0390 Unspecified dementia without behavioral disturbance: Secondary | ICD-10-CM | POA: Diagnosis not present

## 2020-11-08 DIAGNOSIS — I130 Hypertensive heart and chronic kidney disease with heart failure and stage 1 through stage 4 chronic kidney disease, or unspecified chronic kidney disease: Secondary | ICD-10-CM | POA: Diagnosis not present

## 2020-11-12 DIAGNOSIS — N390 Urinary tract infection, site not specified: Secondary | ICD-10-CM | POA: Diagnosis not present

## 2020-11-12 DIAGNOSIS — I5031 Acute diastolic (congestive) heart failure: Secondary | ICD-10-CM | POA: Diagnosis not present

## 2020-11-12 DIAGNOSIS — F0390 Unspecified dementia without behavioral disturbance: Secondary | ICD-10-CM | POA: Diagnosis not present

## 2020-11-12 DIAGNOSIS — I482 Chronic atrial fibrillation, unspecified: Secondary | ICD-10-CM | POA: Diagnosis not present

## 2020-11-12 DIAGNOSIS — J9601 Acute respiratory failure with hypoxia: Secondary | ICD-10-CM | POA: Diagnosis not present

## 2020-11-12 DIAGNOSIS — E1122 Type 2 diabetes mellitus with diabetic chronic kidney disease: Secondary | ICD-10-CM | POA: Diagnosis not present

## 2020-11-12 DIAGNOSIS — N1832 Chronic kidney disease, stage 3b: Secondary | ICD-10-CM | POA: Diagnosis not present

## 2020-11-12 DIAGNOSIS — I130 Hypertensive heart and chronic kidney disease with heart failure and stage 1 through stage 4 chronic kidney disease, or unspecified chronic kidney disease: Secondary | ICD-10-CM | POA: Diagnosis not present

## 2020-11-12 DIAGNOSIS — J189 Pneumonia, unspecified organism: Secondary | ICD-10-CM | POA: Diagnosis not present

## 2020-11-14 ENCOUNTER — Encounter: Payer: Self-pay | Admitting: Podiatry

## 2020-11-14 ENCOUNTER — Ambulatory Visit (INDEPENDENT_AMBULATORY_CARE_PROVIDER_SITE_OTHER): Payer: Medicare HMO | Admitting: Podiatry

## 2020-11-14 ENCOUNTER — Other Ambulatory Visit: Payer: Self-pay

## 2020-11-14 DIAGNOSIS — B351 Tinea unguium: Secondary | ICD-10-CM | POA: Diagnosis not present

## 2020-11-14 DIAGNOSIS — M79675 Pain in left toe(s): Secondary | ICD-10-CM | POA: Diagnosis not present

## 2020-11-14 DIAGNOSIS — M79674 Pain in right toe(s): Secondary | ICD-10-CM | POA: Diagnosis not present

## 2020-11-19 DIAGNOSIS — I482 Chronic atrial fibrillation, unspecified: Secondary | ICD-10-CM | POA: Diagnosis not present

## 2020-11-19 DIAGNOSIS — J9601 Acute respiratory failure with hypoxia: Secondary | ICD-10-CM | POA: Diagnosis not present

## 2020-11-19 DIAGNOSIS — E1122 Type 2 diabetes mellitus with diabetic chronic kidney disease: Secondary | ICD-10-CM | POA: Diagnosis not present

## 2020-11-19 DIAGNOSIS — N1832 Chronic kidney disease, stage 3b: Secondary | ICD-10-CM | POA: Diagnosis not present

## 2020-11-19 DIAGNOSIS — J189 Pneumonia, unspecified organism: Secondary | ICD-10-CM | POA: Diagnosis not present

## 2020-11-19 DIAGNOSIS — N390 Urinary tract infection, site not specified: Secondary | ICD-10-CM | POA: Diagnosis not present

## 2020-11-19 DIAGNOSIS — F0390 Unspecified dementia without behavioral disturbance: Secondary | ICD-10-CM | POA: Diagnosis not present

## 2020-11-19 DIAGNOSIS — I130 Hypertensive heart and chronic kidney disease with heart failure and stage 1 through stage 4 chronic kidney disease, or unspecified chronic kidney disease: Secondary | ICD-10-CM | POA: Diagnosis not present

## 2020-11-19 DIAGNOSIS — I5031 Acute diastolic (congestive) heart failure: Secondary | ICD-10-CM | POA: Diagnosis not present

## 2020-11-20 ENCOUNTER — Encounter (HOSPITAL_COMMUNITY): Payer: Medicare HMO

## 2020-11-20 NOTE — Progress Notes (Signed)
Subjective:   Patient ID: Jenny Glover, female   DOB: 75 y.o.   MRN: [id number]   HPI Patient presents with elongated thickened nailbeds 1-5 both feet that are painful   ROS      Objective:  Physical Exam  Neurovascular status intact with yellow thick painful nailbeds 1-5 both feet with subungual debris and pain in the corners 1-5 both feet     Assessment:  Mycotic nail infection with pain 1-5 both feet     Plan:  Debridement of nailbeds 1-5 both feet no iatrogenic bleeding reappoint routine care

## 2020-11-26 DIAGNOSIS — N390 Urinary tract infection, site not specified: Secondary | ICD-10-CM | POA: Diagnosis not present

## 2020-11-26 DIAGNOSIS — E1122 Type 2 diabetes mellitus with diabetic chronic kidney disease: Secondary | ICD-10-CM | POA: Diagnosis not present

## 2020-11-26 DIAGNOSIS — I482 Chronic atrial fibrillation, unspecified: Secondary | ICD-10-CM | POA: Diagnosis not present

## 2020-11-26 DIAGNOSIS — J189 Pneumonia, unspecified organism: Secondary | ICD-10-CM | POA: Diagnosis not present

## 2020-11-26 DIAGNOSIS — J9601 Acute respiratory failure with hypoxia: Secondary | ICD-10-CM | POA: Diagnosis not present

## 2020-11-26 DIAGNOSIS — I5031 Acute diastolic (congestive) heart failure: Secondary | ICD-10-CM | POA: Diagnosis not present

## 2020-11-26 DIAGNOSIS — F0390 Unspecified dementia without behavioral disturbance: Secondary | ICD-10-CM | POA: Diagnosis not present

## 2020-11-26 DIAGNOSIS — I130 Hypertensive heart and chronic kidney disease with heart failure and stage 1 through stage 4 chronic kidney disease, or unspecified chronic kidney disease: Secondary | ICD-10-CM | POA: Diagnosis not present

## 2020-11-26 DIAGNOSIS — N1832 Chronic kidney disease, stage 3b: Secondary | ICD-10-CM | POA: Diagnosis not present

## 2020-12-03 DIAGNOSIS — J189 Pneumonia, unspecified organism: Secondary | ICD-10-CM | POA: Diagnosis not present

## 2020-12-03 DIAGNOSIS — E1122 Type 2 diabetes mellitus with diabetic chronic kidney disease: Secondary | ICD-10-CM | POA: Diagnosis not present

## 2020-12-03 DIAGNOSIS — I482 Chronic atrial fibrillation, unspecified: Secondary | ICD-10-CM | POA: Diagnosis not present

## 2020-12-03 DIAGNOSIS — N1832 Chronic kidney disease, stage 3b: Secondary | ICD-10-CM | POA: Diagnosis not present

## 2020-12-03 DIAGNOSIS — I130 Hypertensive heart and chronic kidney disease with heart failure and stage 1 through stage 4 chronic kidney disease, or unspecified chronic kidney disease: Secondary | ICD-10-CM | POA: Diagnosis not present

## 2020-12-03 DIAGNOSIS — J9601 Acute respiratory failure with hypoxia: Secondary | ICD-10-CM | POA: Diagnosis not present

## 2020-12-03 DIAGNOSIS — F0390 Unspecified dementia without behavioral disturbance: Secondary | ICD-10-CM | POA: Diagnosis not present

## 2020-12-03 DIAGNOSIS — N390 Urinary tract infection, site not specified: Secondary | ICD-10-CM | POA: Diagnosis not present

## 2020-12-03 DIAGNOSIS — I5031 Acute diastolic (congestive) heart failure: Secondary | ICD-10-CM | POA: Diagnosis not present

## 2020-12-10 DIAGNOSIS — I5031 Acute diastolic (congestive) heart failure: Secondary | ICD-10-CM | POA: Diagnosis not present

## 2020-12-10 DIAGNOSIS — J9601 Acute respiratory failure with hypoxia: Secondary | ICD-10-CM | POA: Diagnosis not present

## 2020-12-10 DIAGNOSIS — J189 Pneumonia, unspecified organism: Secondary | ICD-10-CM | POA: Diagnosis not present

## 2020-12-10 DIAGNOSIS — F0390 Unspecified dementia without behavioral disturbance: Secondary | ICD-10-CM | POA: Diagnosis not present

## 2020-12-10 DIAGNOSIS — N1832 Chronic kidney disease, stage 3b: Secondary | ICD-10-CM | POA: Diagnosis not present

## 2020-12-10 DIAGNOSIS — N390 Urinary tract infection, site not specified: Secondary | ICD-10-CM | POA: Diagnosis not present

## 2020-12-10 DIAGNOSIS — E1122 Type 2 diabetes mellitus with diabetic chronic kidney disease: Secondary | ICD-10-CM | POA: Diagnosis not present

## 2020-12-10 DIAGNOSIS — I130 Hypertensive heart and chronic kidney disease with heart failure and stage 1 through stage 4 chronic kidney disease, or unspecified chronic kidney disease: Secondary | ICD-10-CM | POA: Diagnosis not present

## 2020-12-10 DIAGNOSIS — I482 Chronic atrial fibrillation, unspecified: Secondary | ICD-10-CM | POA: Diagnosis not present

## 2020-12-13 DIAGNOSIS — I5032 Chronic diastolic (congestive) heart failure: Secondary | ICD-10-CM | POA: Diagnosis not present

## 2020-12-13 DIAGNOSIS — I480 Paroxysmal atrial fibrillation: Secondary | ICD-10-CM | POA: Diagnosis not present

## 2020-12-13 DIAGNOSIS — I2510 Atherosclerotic heart disease of native coronary artery without angina pectoris: Secondary | ICD-10-CM | POA: Diagnosis not present

## 2020-12-13 DIAGNOSIS — I639 Cerebral infarction, unspecified: Secondary | ICD-10-CM | POA: Diagnosis not present

## 2020-12-13 DIAGNOSIS — N1832 Chronic kidney disease, stage 3b: Secondary | ICD-10-CM | POA: Diagnosis not present

## 2020-12-13 DIAGNOSIS — F0150 Vascular dementia without behavioral disturbance: Secondary | ICD-10-CM | POA: Diagnosis not present

## 2020-12-13 DIAGNOSIS — E1142 Type 2 diabetes mellitus with diabetic polyneuropathy: Secondary | ICD-10-CM | POA: Diagnosis not present

## 2020-12-13 DIAGNOSIS — I350 Nonrheumatic aortic (valve) stenosis: Secondary | ICD-10-CM | POA: Diagnosis not present

## 2020-12-13 DIAGNOSIS — E669 Obesity, unspecified: Secondary | ICD-10-CM | POA: Diagnosis not present

## 2021-01-07 ENCOUNTER — Other Ambulatory Visit: Payer: Self-pay

## 2021-01-07 ENCOUNTER — Ambulatory Visit (INDEPENDENT_AMBULATORY_CARE_PROVIDER_SITE_OTHER): Payer: Medicare HMO | Admitting: Podiatry

## 2021-01-07 DIAGNOSIS — B351 Tinea unguium: Secondary | ICD-10-CM

## 2021-01-07 DIAGNOSIS — M79675 Pain in left toe(s): Secondary | ICD-10-CM

## 2021-01-07 DIAGNOSIS — M79674 Pain in right toe(s): Secondary | ICD-10-CM

## 2021-01-07 DIAGNOSIS — E1151 Type 2 diabetes mellitus with diabetic peripheral angiopathy without gangrene: Secondary | ICD-10-CM

## 2021-01-07 NOTE — Progress Notes (Signed)
Patient presented for foam casting for 3 pair custom diabetic shoe inserts. Patient is measured with a [place]annock Device to be a size 11 x-wide  Diabetic shoes are chosen from the [employer] catalog.  The shoes are chosen are [id number]  Patient will be contacted when the shoes and inserts are ready to be picked up

## 2021-03-04 ENCOUNTER — Telehealth: Payer: Self-pay | Admitting: Podiatry

## 2021-03-04 NOTE — Telephone Encounter (Signed)
Pts husband left message yesterday checking on status of diabetic shoes stated it has been a month ago or so,  I returned call and left message that they should be shipping anytime that they generally take 4 to 6 weeks if we get documents needed quickly. But I would call when they come in.

## 2021-03-31 ENCOUNTER — Telehealth: Payer: Self-pay | Admitting: Podiatry

## 2021-03-31 NOTE — Telephone Encounter (Signed)
Diabetic shoes/inserts in..lvm for pt to call to schedule an appt to pick them up. 

## 2021-04-01 ENCOUNTER — Telehealth: Payer: Self-pay | Admitting: Podiatry

## 2021-04-01 NOTE — Telephone Encounter (Signed)
Pts husband left message returning my call about getting pt scheduled to pick up diabetic shoes.  I returned call and left message for them to call me back.

## 2021-04-06 ENCOUNTER — Other Ambulatory Visit (HOSPITAL_COMMUNITY): Payer: Self-pay | Admitting: Cardiology

## 2021-04-16 ENCOUNTER — Other Ambulatory Visit: Payer: Self-pay

## 2021-04-16 ENCOUNTER — Ambulatory Visit (INDEPENDENT_AMBULATORY_CARE_PROVIDER_SITE_OTHER): Payer: Medicare HMO | Admitting: Podiatry

## 2021-04-16 DIAGNOSIS — E1140 Type 2 diabetes mellitus with diabetic neuropathy, unspecified: Secondary | ICD-10-CM

## 2021-04-16 DIAGNOSIS — L84 Corns and callosities: Secondary | ICD-10-CM

## 2021-04-16 DIAGNOSIS — E1342 Other specified diabetes mellitus with diabetic polyneuropathy: Secondary | ICD-10-CM

## 2021-04-16 DIAGNOSIS — B351 Tinea unguium: Secondary | ICD-10-CM

## 2021-04-16 DIAGNOSIS — M79675 Pain in left toe(s): Secondary | ICD-10-CM

## 2021-04-16 DIAGNOSIS — E1151 Type 2 diabetes mellitus with diabetic peripheral angiopathy without gangrene: Secondary | ICD-10-CM

## 2021-04-16 DIAGNOSIS — M79674 Pain in right toe(s): Secondary | ICD-10-CM

## 2021-04-16 NOTE — Progress Notes (Signed)
The patient presented to the office today to pick up diabetic shoes and 3 pair diabetic custom inserts.  1 pair of inserts were put in the shoes and the shoes were fitted to the patient. The patient states they are comfortable and free of defect. She was satisfied with the fit of the shoe. Instructions for break in and wear were dispensed. The patient signed the delivery documentation and break in instruction form  If any concerns or questions arise, she is instructed to call 

## 2021-04-18 DIAGNOSIS — E1142 Type 2 diabetes mellitus with diabetic polyneuropathy: Secondary | ICD-10-CM | POA: Diagnosis not present

## 2021-04-18 DIAGNOSIS — I2510 Atherosclerotic heart disease of native coronary artery without angina pectoris: Secondary | ICD-10-CM | POA: Diagnosis not present

## 2021-04-18 DIAGNOSIS — N1832 Chronic kidney disease, stage 3b: Secondary | ICD-10-CM | POA: Diagnosis not present

## 2021-04-18 DIAGNOSIS — I700 Atherosclerosis of aorta: Secondary | ICD-10-CM | POA: Diagnosis not present

## 2021-04-18 DIAGNOSIS — I480 Paroxysmal atrial fibrillation: Secondary | ICD-10-CM | POA: Diagnosis not present

## 2021-04-18 DIAGNOSIS — I350 Nonrheumatic aortic (valve) stenosis: Secondary | ICD-10-CM | POA: Diagnosis not present

## 2021-04-18 DIAGNOSIS — E559 Vitamin D deficiency, unspecified: Secondary | ICD-10-CM | POA: Diagnosis not present

## 2021-04-18 DIAGNOSIS — F0150 Vascular dementia without behavioral disturbance: Secondary | ICD-10-CM | POA: Diagnosis not present

## 2021-04-18 DIAGNOSIS — I5032 Chronic diastolic (congestive) heart failure: Secondary | ICD-10-CM | POA: Diagnosis not present

## 2021-04-18 DIAGNOSIS — D649 Anemia, unspecified: Secondary | ICD-10-CM | POA: Diagnosis not present

## 2021-04-18 DIAGNOSIS — I10 Essential (primary) hypertension: Secondary | ICD-10-CM | POA: Diagnosis not present

## 2021-04-18 DIAGNOSIS — E669 Obesity, unspecified: Secondary | ICD-10-CM | POA: Diagnosis not present

## 2021-04-18 DIAGNOSIS — E785 Hyperlipidemia, unspecified: Secondary | ICD-10-CM | POA: Diagnosis not present

## 2021-04-21 ENCOUNTER — Other Ambulatory Visit: Payer: Self-pay | Admitting: Cardiology

## 2021-05-14 ENCOUNTER — Telehealth: Payer: Self-pay | Admitting: Cardiology

## 2021-05-14 NOTE — Telephone Encounter (Signed)
*  STAT* If patient is at the pharmacy, call can be transferred to refill team.   1. Which medications need to be refilled? (please list name of each medication and dose if known)  atorvastatin (LIPITOR) 80 MG tablet  2. Which pharmacy/location (including street and city if local pharmacy) is medication to be sent to? [place], [address]  3. Do they need a 30 day or 90 day supply? 30 days   Patient is scheduled to see Laurann Montana, NP 06/02/21. The patient will not have enough medication to last her until her appointment

## 2021-05-15 ENCOUNTER — Other Ambulatory Visit: Payer: Self-pay | Admitting: Cardiology

## 2021-05-24 ENCOUNTER — Emergency Department (HOSPITAL_COMMUNITY): Payer: Medicare HMO

## 2021-05-24 ENCOUNTER — Emergency Department (HOSPITAL_COMMUNITY)
Admission: EM | Admit: 2021-05-24 | Discharge: 2021-05-24 | Disposition: A | Discharge status: Home / Self Care | Payer: Medicare HMO | Attending: Emergency Medicine | Admitting: Emergency Medicine

## 2021-05-24 ENCOUNTER — Encounter (HOSPITAL_COMMUNITY): Payer: Self-pay | Admitting: Emergency Medicine

## 2021-05-24 ENCOUNTER — Other Ambulatory Visit: Payer: Self-pay

## 2021-05-24 ENCOUNTER — Emergency Department (HOSPITAL_BASED_OUTPATIENT_CLINIC_OR_DEPARTMENT_OTHER): Payer: Medicare HMO

## 2021-05-24 DIAGNOSIS — S32591A Other specified fracture of right pubis, initial encounter for closed fracture: Secondary | ICD-10-CM | POA: Diagnosis not present

## 2021-05-24 DIAGNOSIS — S32511A Fracture of superior rim of right pubis, initial encounter for closed fracture: Secondary | ICD-10-CM | POA: Diagnosis not present

## 2021-05-24 DIAGNOSIS — M79605 Pain in left leg: Secondary | ICD-10-CM

## 2021-05-24 DIAGNOSIS — S8012XA Contusion of left lower leg, initial encounter: Secondary | ICD-10-CM | POA: Insufficient documentation

## 2021-05-24 DIAGNOSIS — F0390 Unspecified dementia without behavioral disturbance: Secondary | ICD-10-CM | POA: Insufficient documentation

## 2021-05-24 DIAGNOSIS — R2242 Localized swelling, mass and lump, left lower limb: Secondary | ICD-10-CM | POA: Insufficient documentation

## 2021-05-24 DIAGNOSIS — Y9389 Activity, other specified: Secondary | ICD-10-CM | POA: Diagnosis not present

## 2021-05-24 DIAGNOSIS — I4891 Unspecified atrial fibrillation: Secondary | ICD-10-CM | POA: Insufficient documentation

## 2021-05-24 DIAGNOSIS — S3993XA Unspecified injury of pelvis, initial encounter: Secondary | ICD-10-CM | POA: Diagnosis present

## 2021-05-24 DIAGNOSIS — I110 Hypertensive heart disease with heart failure: Secondary | ICD-10-CM | POA: Diagnosis not present

## 2021-05-24 DIAGNOSIS — Z85828 Personal history of other malignant neoplasm of skin: Secondary | ICD-10-CM | POA: Diagnosis not present

## 2021-05-24 DIAGNOSIS — I2510 Atherosclerotic heart disease of native coronary artery without angina pectoris: Secondary | ICD-10-CM | POA: Diagnosis not present

## 2021-05-24 DIAGNOSIS — E1151 Type 2 diabetes mellitus with diabetic peripheral angiopathy without gangrene: Secondary | ICD-10-CM | POA: Insufficient documentation

## 2021-05-24 DIAGNOSIS — W010XXA Fall on same level from slipping, tripping and stumbling without subsequent striking against object, initial encounter: Secondary | ICD-10-CM | POA: Diagnosis not present

## 2021-05-24 DIAGNOSIS — Y9289 Other specified places as the place of occurrence of the external cause: Secondary | ICD-10-CM | POA: Diagnosis not present

## 2021-05-24 DIAGNOSIS — M25562 Pain in left knee: Secondary | ICD-10-CM

## 2021-05-24 DIAGNOSIS — W19XXXA Unspecified fall, initial encounter: Secondary | ICD-10-CM

## 2021-05-24 DIAGNOSIS — S32501A Unspecified fracture of right pubis, initial encounter for closed fracture: Secondary | ICD-10-CM | POA: Diagnosis not present

## 2021-05-24 DIAGNOSIS — I5031 Acute diastolic (congestive) heart failure: Secondary | ICD-10-CM | POA: Insufficient documentation

## 2021-05-24 DIAGNOSIS — M25551 Pain in right hip: Secondary | ICD-10-CM | POA: Diagnosis not present

## 2021-05-24 DIAGNOSIS — M7989 Other specified soft tissue disorders: Secondary | ICD-10-CM | POA: Diagnosis not present

## 2021-05-24 LAB — COMPREHENSIVE METABOLIC PANEL
ALT: 11 U/L (ref 0–44)
AST: 17 U/L (ref 15–41)
Albumin: 3.1 g/dL — ABNORMAL LOW (ref 3.5–5.0)
Alkaline Phosphatase: 79 U/L (ref 38–126)
Anion gap: 9 (ref 5–15)
BUN: 28 mg/dL — ABNORMAL HIGH (ref 8–23)
CO2: 27 mmol/L (ref 22–32)
Calcium: 9.4 mg/dL (ref 8.9–10.3)
Chloride: 102 mmol/L (ref 98–111)
Creatinine, Ser: 1.57 mg/dL — ABNORMAL HIGH (ref 0.44–1.00)
GFR, Estimated: 35 mL/min — ABNORMAL LOW (ref >60)
Glucose, Bld: 214 mg/dL — ABNORMAL HIGH (ref 70–99)
Potassium: 4.5 mmol/L (ref 3.5–5.1)
Sodium: 138 mmol/L (ref 135–145)
Total Bilirubin: 1 mg/dL (ref 0.3–1.2)
Total Protein: 6.9 g/dL (ref 6.5–8.1)

## 2021-05-24 LAB — CBC WITH DIFFERENTIAL/PLATELET
Abs Immature Granulocytes: 0.07 10*3/uL (ref 0.00–0.07)
Basophils Absolute: 0.1 10*3/uL (ref 0.0–0.1)
Basophils Relative: 0 %
Eosinophils Absolute: 0.3 10*3/uL (ref 0.0–0.5)
Eosinophils Relative: 3 %
HCT: 34 % — ABNORMAL LOW (ref 36.0–46.0)
Hemoglobin: 10.6 g/dL — ABNORMAL LOW (ref 12.0–15.0)
Immature Granulocytes: 1 %
Lymphocytes Relative: 9 %
Lymphs Abs: 1.1 10*3/uL (ref 0.7–4.0)
MCH: 29.1 pg (ref 26.0–34.0)
MCHC: 31.2 g/dL (ref 30.0–36.0)
MCV: 93.4 fL (ref 80.0–100.0)
Monocytes Absolute: 1.1 10*3/uL — ABNORMAL HIGH (ref 0.1–1.0)
Monocytes Relative: 9 %
Neutro Abs: 9.3 10*3/uL — ABNORMAL HIGH (ref 1.7–7.7)
Neutrophils Relative %: 78 %
Platelets: 228 10*3/uL (ref 150–400)
RBC: 3.64 MIL/uL — ABNORMAL LOW (ref 3.87–5.11)
RDW: 14.4 % (ref 11.5–15.5)
WBC: 11.9 10*3/uL — ABNORMAL HIGH (ref 4.0–10.5)
nRBC: 0 % (ref 0.0–0.2)

## 2021-05-24 LAB — PROTIME-INR
INR: 3.3 — ABNORMAL HIGH (ref 0.8–1.2)
Prothrombin Time: 33.4 seconds — ABNORMAL HIGH (ref 11.4–15.2)

## 2021-05-24 LAB — BRAIN NATRIURETIC PEPTIDE: B Natriuretic Peptide: 464.2 pg/mL — ABNORMAL HIGH (ref 0.0–100.0)

## 2021-05-24 NOTE — ED Notes (Signed)
Pts daughter is on her way here

## 2021-05-24 NOTE — ED Provider Notes (Signed)
Emergency Medicine Provider Triage Evaluation Note  Jenny Glover , a 75 y.o. female  was evaluated in triage.  Pt complains of pain in her right hip and swelling and pain to the left lateral lower leg after fall.  Patient with recurrent falls when she does not use her walker.  Denies head trauma, is anticoagulated  with Xarelto.  Review of Systems  Positive: Left lower leg pain, right hip pain Negative: Headache, blurry or double vision, nausea, vomiting  Physical Exam  BP 125/67 (BP Location: Left Arm)   Pulse (!) 56   Temp 98.4 F (36.9 C) (Oral)   Resp 17   SpO2 97%  Gen:   Awake, no distress   Resp:  Normal effort  MSK:   Moves extremities without difficulty  Other:  Hematoma and bruising over the left lateral proximal lower leg, 1+ pedal pulses bilaterally.  Tenderness to palpation over the right hip.  Medical Decision Making  Medically screening exam initiated at 11:19 AM.  Appropriate orders placed.  KARIANNE NOGUEIRA was informed that the remainder of the evaluation will be completed by another provider, this initial triage assessment does not replace that evaluation, and the importance of remaining in the ED until their evaluation is complete.  Fall on thinners was 2 days ago, patient without any neurologic symptoms at this time he denies headache, will not proceed with CT head or leveling of this fall.  We will proceed with plain films of focal extremity pain.  This chart was dictated using voice recognition software, Dragon. Despite the best efforts of this provider to proofread and correct errors, errors may still occur which can change documentation meaning.    Aura Dials 05/24/21 1127    Margette Fast, MD 06/01/21 1758

## 2021-05-24 NOTE — ED Triage Notes (Signed)
Pt tripped and fell on Thursday morning after getting out of bed.  C/o pain to upper back, bilateral hips, groin, and bilateral legs.  Bruise and "knot" to posterior R lower leg since yesterday.

## 2021-05-24 NOTE — TOC Transition Note (Signed)
Transition of Care [facility]) - CM/SW Discharge Note   Patient Details  Name: Jenny Glover MRN: [id number] Date of Birth: 1946/08/15  [facility]) CM/SW Contact:  Verdell Carmine, RN Phone Number: 05/24/2021, 4:26 PM   Clinical Narrative:     Patient in the ED for a fall on blood thinners. Spoke to husband and daughter in conference call. They state she has all equipment needed, wheelchair, walker, bsc at home. She has had Phil Campbell before, with [facility]. They would like them back to recondition. Messaged RN and MD to confer. MD will order [facility] PT OT and social work. Accepted by Tommi Rumps at [place]    Barriers to Discharge: Continued Medical Work up   Patient Goals and CMS Choice        Discharge Placement               Home with home health PT OT CSW        Discharge Plan and Services                                     Social Determinants of Health (SDOH) Interventions     Readmission Risk Interventions No flowsheet data found.

## 2021-05-24 NOTE — Progress Notes (Signed)
VASCULAR LAB    Left lower extremity venous duplex has been performed.  See CV proc for preliminary results.  Messaged results to Dr. Laverta Baltimore via secure chat  Sharion Dove, RVT 05/24/2021, 4:47 PM

## 2021-05-24 NOTE — Discharge Instructions (Signed)
You were seen in the emergency room today with pain after fall.  You do have fractures of your bones in the pelvis but you are able to stand and walk on these types of fractures.  Your left leg has a small collection of blood which I expect to resolve over time.  We did not find any broken bones in the legs.  I have reordered home health and will have you continue to use your wheelchair and/or walker as needed at home.  The home health nurse as well as physical therapy, Occupational Therapy, social worker will be available to you as an outpatient.  You can continue discussed this with your primary care doctor as well. Return with any new or worsening symptoms.

## 2021-05-24 NOTE — ED Provider Notes (Signed)
Emergency Department Provider Note   I have reviewed the triage vital signs and the nursing notes.   HISTORY  Chief Complaint Fall   HPI Jenny Glover is a 75 y.o. female with past medical history reviewed below including A. fib on anticoagulation, diabetes, hypertension, dementia presents to the emergency department with her husband.  He states that she had a fall at home several days ago has been complaining of pain in the lower left leg mainly.  He states that at baseline she ambulates with a walker and uses a wheelchair but her mobility is already limited.  Since the fall she has been up and walking but less than normal.  They have had to use the wheelchair at home but she is able to stand and pivot and walk short distances with her walker.  Husband states that her fall was related to a trip and fall while getting up in the night to go to the bathroom.  The patient does not recall the particulars surrounding the incident due to her dementia.  The husband tells me that in particular he was concerned about her left lower leg because he is noticed some swelling near the calf which is gone down slightly and developed some mild bruising.   Level 5 caveat: Dementia    Past Medical History:  Diagnosis Date   Anxiety    Aortic stenosis    Atrial fibrillation [facility])    s/p DCCV 2005, 2006. Placed on Tikosyn 2006 (negative adenosine cardiolite 03/2005, could not measure EF).   CAD (coronary artery disease)    CHF (congestive heart failure) (HCC)    Depression    Diabetes mellitus without complication (HCC)    Diabetic foot ulcer ([place])    High cholesterol    History of cardioversion 2005/2006   Hyperlipemia    Hypertension    Mental status change    Neuropathy    Obesity    Skin cancer of face    treated with radiation   Sleep apnea     Patient Active Problem List   Diagnosis Date Noted   Bronchospasm    Acute respiratory failure with hypoxemia ([place]) 93/57/0177   Acute  diastolic CHF (congestive heart failure) ([place]) 08/08/2020   Acute respiratory failure with hypoxia ([place]) 08/08/2020   S/P right hip fracture    Pressure injury of skin 08/17/2019   Closed right hip fracture, initial encounter ([place]) 08/15/2019   ARF (acute renal failure) ([place]) 08/15/2019   Aortic stenosis 07/08/2018   Senile dementia, uncomplicated ([place]) 93/90/3009   OSA on CPAP 12/30/2015   CAD (coronary artery disease) 08/23/2012   Morbid obesity ([place])    DM (diabetes mellitus) with peripheral vascular complication ([place]) 23/30/0762   Hypertensive heart disease    Chronic atrial fibrillation    Hyperlipidemia     Past Surgical History:  Procedure Laterality Date   INTRAMEDULLARY (IM) NAIL INTERTROCHANTERIC Right 08/19/2019   Procedure: INTRAMEDULLARY (IM) NAIL INTERTROCHANTRIC;  Surgeon: Rod Can, MD;  Location: [facility];  Service: Orthopedics;  Laterality: Right;   LEFT HEART CATHETERIZATION WITH CORONARY ANGIOGRAM N/A 08/23/2012   Procedure: LEFT HEART CATHETERIZATION WITH CORONARY ANGIOGRAM;  Surgeon: Jettie Booze, MD;  Location: [facility] CATH LAB;  Service: Cardiovascular;  Laterality: N/A;   PERCUTANEOUS CORONARY STENT INTERVENTION (PCI-S)  08/23/2012   Procedure: PERCUTANEOUS CORONARY STENT INTERVENTION (PCI-S);  Surgeon: Jettie Booze, MD;  Location: [facility] CATH LAB;  Service: Cardiovascular;;   RIGHT HEART CATH AND CORONARY ANGIOGRAPHY N/A 09/18/2020  Procedure: RIGHT HEART CATH AND CORONARY ANGIOGRAPHY;  Surgeon: Larey Dresser, MD;  Location: [facility] CV LAB;  Service: Cardiovascular;  Laterality: N/A;   SKIN CANCER EXCISION     TENOTOMY      Allergies Patient has no known allergies.  Family History  Problem Relation Age of Onset   Diabetes Father    Coronary artery disease Father    Stroke Mother     Social History Social History   Tobacco Use   Smoking status: Never   Smokeless tobacco: Never  Vaping Use   Vaping Use: Never used  Substance Use  Topics   Alcohol use: No    Alcohol/week: 0.0 standard drinks   Drug use: No    Review of Systems  Constitutional: No fever/chills Eyes: No visual changes. ENT: No sore throat. Cardiovascular: Denies chest pain. Respiratory: Denies shortness of breath. Gastrointestinal: No abdominal pain.  No nausea, no vomiting.  No diarrhea.  No constipation. Genitourinary: Negative for dysuria. Musculoskeletal: Negative for back pain. Positive left leg pain.  Skin: Negative for rash. Neurological: Negative for headaches, focal weakness or numbness.  10-point ROS otherwise negative.  ____________________________________________   PHYSICAL EXAM:  VITAL SIGNS: ED Triage Vitals  Enc Vitals Group     BP 05/24/21 1106 125/67     Pulse Rate 05/24/21 1106 (!) 56     Resp 05/24/21 1106 17     Temp 05/24/21 1106 98.4 F (36.9 C)     Temp Source 05/24/21 1106 Oral     SpO2 05/24/21 1106 97 %   Constitutional: Alert but confused (baseline). Well appearing and in no acute distress. Eyes: Conjunctivae are normal.  Head: Atraumatic. Nose: No congestion/rhinnorhea. Mouth/Throat: Mucous membranes are moist.  Neck: No stridor. No cervical spine tenderness to palpation. Cardiovascular: Normal rate, regular rhythm. Good peripheral circulation. Grossly normal heart sounds.   Respiratory: Normal respiratory effort.  No retractions. Lungs CTAB. Gastrointestinal: Soft and nontender. No distention.  Musculoskeletal: Normal ROM of the bilateral LEs with some mild pain with ROM of the right hip. No shortening or malrotation. Focal area of left calf swelling noted (5 cm) with no significant ecchymosis. No bony tenderness over the knee or ankle.  Neurologic:  Normal speech and language. No gross focal neurologic deficits are appreciated.  Skin:  Skin is warm, dry and intact. No rash noted.  ____________________________________________   LABS (all labs ordered are listed, but only abnormal results are  displayed)  Labs Reviewed  COMPREHENSIVE METABOLIC PANEL - Abnormal; Notable for the following components:      Result Value   Glucose, Bld 214 (*)    BUN 28 (*)    Creatinine, Ser 1.57 (*)    Albumin 3.1 (*)    GFR, Estimated 35 (*)    All other components within normal limits  CBC WITH DIFFERENTIAL/PLATELET - Abnormal; Notable for the following components:   WBC 11.9 (*)    RBC 3.64 (*)    Hemoglobin 10.6 (*)    HCT 34.0 (*)    Neutro Abs 9.3 (*)    Monocytes Absolute 1.1 (*)    All other components within normal limits  PROTIME-INR - Abnormal; Notable for the following components:   Prothrombin Time 33.4 (*)    INR 3.3 (*)    All other components within normal limits  BRAIN NATRIURETIC PEPTIDE - Abnormal; Notable for the following components:   B Natriuretic Peptide 464.2 (*)    All other components within normal limits  ____________________________________________  RADIOLOGY  DG Tibia/Fibula Left  Result Date: 05/24/2021 CLINICAL DATA:  Proximal lateral left lower leg swelling following a fall several days ago. EXAM: LEFT TIBIA AND FIBULA - 2 VIEW COMPARISON:  None. FINDINGS: Diffuse distal and lateral soft tissue swelling. No fracture or dislocation seen. Left knee degenerative changes. Diffuse arterial calcifications. Calcaneal enthesophyte formation. IMPRESSION: 1. Soft tissue swelling without fracture or dislocation. 2. Left knee degenerative changes. 3. Diffuse arterial atheromatous calcifications. Electronically Signed   By: Claudie Revering M.D.   On: 05/24/2021 13:11   CT PELVIS WO CONTRAST  Result Date: 05/24/2021 CLINICAL DATA:  Pelvic trauma. EXAM: CT PELVIS WITHOUT CONTRAST TECHNIQUE: Multidetector CT imaging of the pelvis was performed following the standard protocol without intravenous contrast. COMPARISON:  August 21, 2012 FINDINGS: Urinary Tract:  No abnormality visualized. Bowel:  Unremarkable visualized pelvic bowel loops. Vascular/Lymphatic: No pathologically  enlarged lymph nodes. Atherosclerosis of the aorta. Reproductive:  No mass or other significant abnormality Other:  None. Musculoskeletal: Intramedullary rod fixation of the right femur, partially visualized. Mildly comminuted transverse fracture of the right proximal superior pubic ramus, at the interface with the superior right acetabulum. Comminuted mildly displaced transverse fracture of the midportion of the right inferior pubic ramus. IMPRESSION: 1. Mildly comminuted transverse fracture of the right proximal superior pubic ramus, at the interface with the superior right acetabulum. 2. Comminuted mildly displaced transverse fracture of the midportion of the right inferior pubic ramus. 3. No significant soft tissue hematoma. Aortic Atherosclerosis (ICD10-I70.0). Electronically Signed   By: Fidela Salisbury M.D.   On: 05/24/2021 16:42   DG HIP UNILAT WITH PELVIS 2-3 VIEWS RIGHT  Result Date: 05/24/2021 CLINICAL DATA:  Right hip pain following a fall several days ago. EXAM: DG HIP (WITH OR WITHOUT PELVIS) 2-3V RIGHT COMPARISON:  08/19/2019. FINDINGS: Stable intramedullary rod and compression screw fixation of the previously demonstrated comminuted right intertrochanteric fracture. The fracture has healed. There are acute fractures of the lateral aspect of the right superior pubic ramus in the ischial region and of the right inferior pubic ramus. Lower lumbar spine degenerative changes. Atheromatous arterial calcifications. Prominent stool throughout the included colon and rectum. IMPRESSION: 1. Acute, minimally displaced fractures of the right superior and inferior pubic rami. 2. Prominent stool in the colon. Electronically Signed   By: Claudie Revering M.D.   On: 05/24/2021 13:13   VAS [place] LOWER EXTREMITY VENOUS (DVT) ([facility] and [facility] 7a-7p)  Result Date: 05/24/2021  Lower Venous DVT Study Patient Name:  Jenny Glover  Date of Exam:   05/24/2021 Medical Rec #: [id number]       Accession #:    [id number] Date of  Birth: 1946-08-05      Patient Gender: F Patient Age:   3 years Exam Location:  [facility] Procedure:      VAS [place] LOWER EXTREMITY VENOUS (DVT) Referring Phys: Javohn Basey --------------------------------------------------------------------------------  Indications: Pain. Other Indications: Status post fall (on blood thinner) fracture of the right                    proximal superior pubic ramus and fracture of the midportion                    of the right inferior pubic ramus. Limitations: Edema. Comparison Study: No prior study on file Performing Technologist: Sharion Dove RVS  Examination Guidelines: A complete evaluation includes B-mode imaging, spectral Doppler, color Doppler, and power Doppler as needed of all accessible  portions of each vessel. Bilateral testing is considered an integral part of a complete examination. Limited examinations for reoccurring indications may be performed as noted. The reflux portion of the exam is performed with the patient in reverse Trendelenburg.  +-----+---------------+---------+-----------+----------+--------------+ RIGHTCompressibilityPhasicitySpontaneityPropertiesThrombus Aging +-----+---------------+---------+-----------+----------+--------------+ CFV  Full           Yes      Yes                                 +-----+---------------+---------+-----------+----------+--------------+   +---------+---------------+---------+-----------+----------+-------------------+ LEFT     CompressibilityPhasicitySpontaneityPropertiesThrombus Aging      +---------+---------------+---------+-----------+----------+-------------------+ CFV      Full           Yes      Yes                                      +---------+---------------+---------+-----------+----------+-------------------+ SFJ      Full                                                             +---------+---------------+---------+-----------+----------+-------------------+ FV  Prox  Full                                                             +---------+---------------+---------+-----------+----------+-------------------+ FV Mid   Full                                                             +---------+---------------+---------+-----------+----------+-------------------+ FV DistalFull                                                             +---------+---------------+---------+-----------+----------+-------------------+ PFV      Full                                                             +---------+---------------+---------+-----------+----------+-------------------+ POP      Full           Yes      Yes                                      +---------+---------------+---------+-----------+----------+-------------------+ PTV      Full  not all portions                                                          visualized          +---------+---------------+---------+-----------+----------+-------------------+ PERO     Full                                         not all portions                                                          visualized          +---------+---------------+---------+-----------+----------+-------------------+    Summary: RIGHT: - No evidence of common femoral vein obstruction.  LEFT: - There is no evidence of deep vein thrombosis in the lower extremity. However, portions of this examination were limited- see technologist comments above.  - A cystic structure is found in the popliteal fossa.  *See table(s) above for measurements and observations.    Preliminary     ____________________________________________   PROCEDURES  Procedure(s) performed:   Procedures  None  ____________________________________________   INITIAL IMPRESSION / ASSESSMENT AND PLAN / ED COURSE  Pertinent labs & imaging results that were available during my care of the  patient were reviewed by me and considered in my medical decision making (see chart for details).   Patient presents to the emergency department evaluation after trip and fall with pain mainly in the left lower leg.  Some discomfort with range of motion of the right hip as well.  Plain films from the MSE process show superior and inferior pubic rami fractures but no other significant pelvic fracture.  Patient does have hardware on the right from a prior surgery.  CT imaging of the pelvis obtained showing no additional fractures other than the pubic rami fracture.  Plan for this is to weight-bear as tolerated.  Plain films of the left knee do not show any acute fracture.  She does have a focal area of hematoma to the calf but this appears relatively small and per the husband's description is decreasing in size.  She has no significant bony tenderness.  Her mobility is limited at baseline but has been ambulatory at home since the fall.   Reviewed lab work showing mild renal insufficiency and hyperglycemia without DKA.  INR is 3.3 the patient is not on Coumadin.  LFTs are normal.  Plan for close PCP follow-up.  Discussed management plan including home health which was ordered and discussed with the case management team.  Patient has appropriate DME at home. Discussed discharge plan with the patient and husband at bedside who are in agreement with the plan at discharge.    ____________________________________________  FINAL CLINICAL IMPRESSION(S) / ED DIAGNOSES  Final diagnoses:  Fall, initial encounter  Closed fracture of multiple rami of right pubis, initial encounter ([place])  Acute pain of left knee  Hematoma of left lower extremity, initial encounter     Note:  This document was prepared using Systems analyst and  may include unintentional dictation errors.  Nanda Quinton, MD, [facility] Emergency Medicine    Farrel Guimond, [place], MD 05/24/21 1929

## 2021-05-28 DIAGNOSIS — I2510 Atherosclerotic heart disease of native coronary artery without angina pectoris: Secondary | ICD-10-CM | POA: Diagnosis not present

## 2021-05-28 DIAGNOSIS — I5031 Acute diastolic (congestive) heart failure: Secondary | ICD-10-CM | POA: Diagnosis not present

## 2021-05-28 DIAGNOSIS — S32591D Other specified fracture of right pubis, subsequent encounter for fracture with routine healing: Secondary | ICD-10-CM | POA: Diagnosis not present

## 2021-05-28 DIAGNOSIS — S32511D Fracture of superior rim of right pubis, subsequent encounter for fracture with routine healing: Secondary | ICD-10-CM | POA: Diagnosis not present

## 2021-05-28 DIAGNOSIS — S32401D Unspecified fracture of right acetabulum, subsequent encounter for fracture with routine healing: Secondary | ICD-10-CM | POA: Diagnosis not present

## 2021-05-28 DIAGNOSIS — I110 Hypertensive heart disease with heart failure: Secondary | ICD-10-CM | POA: Diagnosis not present

## 2021-05-28 DIAGNOSIS — M1712 Unilateral primary osteoarthritis, left knee: Secondary | ICD-10-CM | POA: Diagnosis not present

## 2021-05-28 DIAGNOSIS — F0390 Unspecified dementia without behavioral disturbance: Secondary | ICD-10-CM | POA: Diagnosis not present

## 2021-05-28 DIAGNOSIS — I482 Chronic atrial fibrillation, unspecified: Secondary | ICD-10-CM | POA: Diagnosis not present

## 2021-05-30 ENCOUNTER — Other Ambulatory Visit (HOSPITAL_COMMUNITY): Payer: Self-pay | Admitting: Cardiology

## 2021-05-30 DIAGNOSIS — I5031 Acute diastolic (congestive) heart failure: Secondary | ICD-10-CM | POA: Diagnosis not present

## 2021-05-30 DIAGNOSIS — S32511D Fracture of superior rim of right pubis, subsequent encounter for fracture with routine healing: Secondary | ICD-10-CM | POA: Diagnosis not present

## 2021-05-30 DIAGNOSIS — I110 Hypertensive heart disease with heart failure: Secondary | ICD-10-CM | POA: Diagnosis not present

## 2021-05-30 DIAGNOSIS — S32591D Other specified fracture of right pubis, subsequent encounter for fracture with routine healing: Secondary | ICD-10-CM | POA: Diagnosis not present

## 2021-05-30 DIAGNOSIS — M1712 Unilateral primary osteoarthritis, left knee: Secondary | ICD-10-CM | POA: Diagnosis not present

## 2021-05-30 DIAGNOSIS — I482 Chronic atrial fibrillation, unspecified: Secondary | ICD-10-CM | POA: Diagnosis not present

## 2021-05-30 DIAGNOSIS — I2510 Atherosclerotic heart disease of native coronary artery without angina pectoris: Secondary | ICD-10-CM | POA: Diagnosis not present

## 2021-05-30 DIAGNOSIS — S32401D Unspecified fracture of right acetabulum, subsequent encounter for fracture with routine healing: Secondary | ICD-10-CM | POA: Diagnosis not present

## 2021-05-30 DIAGNOSIS — F0390 Unspecified dementia without behavioral disturbance: Secondary | ICD-10-CM | POA: Diagnosis not present

## 2021-06-02 ENCOUNTER — Ambulatory Visit (HOSPITAL_BASED_OUTPATIENT_CLINIC_OR_DEPARTMENT_OTHER): Payer: Medicare HMO | Admitting: Family

## 2021-06-02 DIAGNOSIS — F0390 Unspecified dementia without behavioral disturbance: Secondary | ICD-10-CM | POA: Diagnosis not present

## 2021-06-02 DIAGNOSIS — I2510 Atherosclerotic heart disease of native coronary artery without angina pectoris: Secondary | ICD-10-CM | POA: Diagnosis not present

## 2021-06-02 DIAGNOSIS — I5031 Acute diastolic (congestive) heart failure: Secondary | ICD-10-CM | POA: Diagnosis not present

## 2021-06-02 DIAGNOSIS — M1712 Unilateral primary osteoarthritis, left knee: Secondary | ICD-10-CM | POA: Diagnosis not present

## 2021-06-02 DIAGNOSIS — I482 Chronic atrial fibrillation, unspecified: Secondary | ICD-10-CM | POA: Diagnosis not present

## 2021-06-02 DIAGNOSIS — S32591D Other specified fracture of right pubis, subsequent encounter for fracture with routine healing: Secondary | ICD-10-CM | POA: Diagnosis not present

## 2021-06-02 DIAGNOSIS — I110 Hypertensive heart disease with heart failure: Secondary | ICD-10-CM | POA: Diagnosis not present

## 2021-06-02 DIAGNOSIS — S32401D Unspecified fracture of right acetabulum, subsequent encounter for fracture with routine healing: Secondary | ICD-10-CM | POA: Diagnosis not present

## 2021-06-02 DIAGNOSIS — S32511D Fracture of superior rim of right pubis, subsequent encounter for fracture with routine healing: Secondary | ICD-10-CM | POA: Diagnosis not present

## 2021-06-03 ENCOUNTER — Telehealth: Payer: Self-pay | Admitting: Cardiology

## 2021-06-03 ENCOUNTER — Other Ambulatory Visit (HOSPITAL_COMMUNITY): Payer: Self-pay | Admitting: Cardiology

## 2021-06-03 MED ORDER — FUROSEMIDE 40 MG PO TABS: ORAL_TABLET | ORAL | 3 refills | Status: DC

## 2021-06-03 NOTE — Telephone Encounter (Signed)
*  STAT* If patient is at the pharmacy, call can be transferred to refill team.   1. Which medications need to be refilled? (please list name of each medication and dose if known) furosemide (LASIX) 40 MG tablet  2. Which pharmacy/location (including street and city if local pharmacy) is medication to be sent to? [place], [address]  3. Do they need a 30 day or 90 day supply? [place]

## 2021-06-03 NOTE — Telephone Encounter (Signed)
Refill sent in per request.  

## 2021-06-06 DIAGNOSIS — I110 Hypertensive heart disease with heart failure: Secondary | ICD-10-CM | POA: Diagnosis not present

## 2021-06-06 DIAGNOSIS — I5031 Acute diastolic (congestive) heart failure: Secondary | ICD-10-CM | POA: Diagnosis not present

## 2021-06-06 DIAGNOSIS — I482 Chronic atrial fibrillation, unspecified: Secondary | ICD-10-CM | POA: Diagnosis not present

## 2021-06-06 DIAGNOSIS — M1712 Unilateral primary osteoarthritis, left knee: Secondary | ICD-10-CM | POA: Diagnosis not present

## 2021-06-06 DIAGNOSIS — F0390 Unspecified dementia without behavioral disturbance: Secondary | ICD-10-CM | POA: Diagnosis not present

## 2021-06-06 DIAGNOSIS — S32401D Unspecified fracture of right acetabulum, subsequent encounter for fracture with routine healing: Secondary | ICD-10-CM | POA: Diagnosis not present

## 2021-06-06 DIAGNOSIS — S32591D Other specified fracture of right pubis, subsequent encounter for fracture with routine healing: Secondary | ICD-10-CM | POA: Diagnosis not present

## 2021-06-06 DIAGNOSIS — S32511D Fracture of superior rim of right pubis, subsequent encounter for fracture with routine healing: Secondary | ICD-10-CM | POA: Diagnosis not present

## 2021-06-06 DIAGNOSIS — I2510 Atherosclerotic heart disease of native coronary artery without angina pectoris: Secondary | ICD-10-CM | POA: Diagnosis not present

## 2021-06-10 DIAGNOSIS — I5031 Acute diastolic (congestive) heart failure: Secondary | ICD-10-CM | POA: Diagnosis not present

## 2021-06-10 DIAGNOSIS — S32591D Other specified fracture of right pubis, subsequent encounter for fracture with routine healing: Secondary | ICD-10-CM | POA: Diagnosis not present

## 2021-06-10 DIAGNOSIS — I2510 Atherosclerotic heart disease of native coronary artery without angina pectoris: Secondary | ICD-10-CM | POA: Diagnosis not present

## 2021-06-10 DIAGNOSIS — S32401D Unspecified fracture of right acetabulum, subsequent encounter for fracture with routine healing: Secondary | ICD-10-CM | POA: Diagnosis not present

## 2021-06-10 DIAGNOSIS — I110 Hypertensive heart disease with heart failure: Secondary | ICD-10-CM | POA: Diagnosis not present

## 2021-06-10 DIAGNOSIS — I482 Chronic atrial fibrillation, unspecified: Secondary | ICD-10-CM | POA: Diagnosis not present

## 2021-06-10 DIAGNOSIS — M1712 Unilateral primary osteoarthritis, left knee: Secondary | ICD-10-CM | POA: Diagnosis not present

## 2021-06-10 DIAGNOSIS — S32511D Fracture of superior rim of right pubis, subsequent encounter for fracture with routine healing: Secondary | ICD-10-CM | POA: Diagnosis not present

## 2021-06-10 DIAGNOSIS — F0390 Unspecified dementia without behavioral disturbance: Secondary | ICD-10-CM | POA: Diagnosis not present

## 2021-06-12 DIAGNOSIS — I5031 Acute diastolic (congestive) heart failure: Secondary | ICD-10-CM | POA: Diagnosis not present

## 2021-06-12 DIAGNOSIS — F0390 Unspecified dementia without behavioral disturbance: Secondary | ICD-10-CM | POA: Diagnosis not present

## 2021-06-12 DIAGNOSIS — I110 Hypertensive heart disease with heart failure: Secondary | ICD-10-CM | POA: Diagnosis not present

## 2021-06-12 DIAGNOSIS — S32511D Fracture of superior rim of right pubis, subsequent encounter for fracture with routine healing: Secondary | ICD-10-CM | POA: Diagnosis not present

## 2021-06-12 DIAGNOSIS — S32591D Other specified fracture of right pubis, subsequent encounter for fracture with routine healing: Secondary | ICD-10-CM | POA: Diagnosis not present

## 2021-06-12 DIAGNOSIS — S32401D Unspecified fracture of right acetabulum, subsequent encounter for fracture with routine healing: Secondary | ICD-10-CM | POA: Diagnosis not present

## 2021-06-12 DIAGNOSIS — I482 Chronic atrial fibrillation, unspecified: Secondary | ICD-10-CM | POA: Diagnosis not present

## 2021-06-12 DIAGNOSIS — I2510 Atherosclerotic heart disease of native coronary artery without angina pectoris: Secondary | ICD-10-CM | POA: Diagnosis not present

## 2021-06-12 DIAGNOSIS — M1712 Unilateral primary osteoarthritis, left knee: Secondary | ICD-10-CM | POA: Diagnosis not present

## 2021-06-13 DIAGNOSIS — I110 Hypertensive heart disease with heart failure: Secondary | ICD-10-CM | POA: Diagnosis not present

## 2021-06-13 DIAGNOSIS — I5031 Acute diastolic (congestive) heart failure: Secondary | ICD-10-CM | POA: Diagnosis not present

## 2021-06-13 DIAGNOSIS — S32591D Other specified fracture of right pubis, subsequent encounter for fracture with routine healing: Secondary | ICD-10-CM | POA: Diagnosis not present

## 2021-06-13 DIAGNOSIS — I2510 Atherosclerotic heart disease of native coronary artery without angina pectoris: Secondary | ICD-10-CM | POA: Diagnosis not present

## 2021-06-13 DIAGNOSIS — I482 Chronic atrial fibrillation, unspecified: Secondary | ICD-10-CM | POA: Diagnosis not present

## 2021-06-13 DIAGNOSIS — M1712 Unilateral primary osteoarthritis, left knee: Secondary | ICD-10-CM | POA: Diagnosis not present

## 2021-06-13 DIAGNOSIS — S32401D Unspecified fracture of right acetabulum, subsequent encounter for fracture with routine healing: Secondary | ICD-10-CM | POA: Diagnosis not present

## 2021-06-13 DIAGNOSIS — S32511D Fracture of superior rim of right pubis, subsequent encounter for fracture with routine healing: Secondary | ICD-10-CM | POA: Diagnosis not present

## 2021-06-13 DIAGNOSIS — F0390 Unspecified dementia without behavioral disturbance: Secondary | ICD-10-CM | POA: Diagnosis not present

## 2021-06-15 ENCOUNTER — Other Ambulatory Visit: Payer: Self-pay | Admitting: Cardiology

## 2021-06-16 DIAGNOSIS — I2510 Atherosclerotic heart disease of native coronary artery without angina pectoris: Secondary | ICD-10-CM | POA: Diagnosis not present

## 2021-06-16 DIAGNOSIS — I482 Chronic atrial fibrillation, unspecified: Secondary | ICD-10-CM | POA: Diagnosis not present

## 2021-06-16 DIAGNOSIS — S32401D Unspecified fracture of right acetabulum, subsequent encounter for fracture with routine healing: Secondary | ICD-10-CM | POA: Diagnosis not present

## 2021-06-16 DIAGNOSIS — S32511D Fracture of superior rim of right pubis, subsequent encounter for fracture with routine healing: Secondary | ICD-10-CM | POA: Diagnosis not present

## 2021-06-16 DIAGNOSIS — F0390 Unspecified dementia without behavioral disturbance: Secondary | ICD-10-CM | POA: Diagnosis not present

## 2021-06-16 DIAGNOSIS — I110 Hypertensive heart disease with heart failure: Secondary | ICD-10-CM | POA: Diagnosis not present

## 2021-06-16 DIAGNOSIS — I5031 Acute diastolic (congestive) heart failure: Secondary | ICD-10-CM | POA: Diagnosis not present

## 2021-06-16 DIAGNOSIS — S32591D Other specified fracture of right pubis, subsequent encounter for fracture with routine healing: Secondary | ICD-10-CM | POA: Diagnosis not present

## 2021-06-16 DIAGNOSIS — M1712 Unilateral primary osteoarthritis, left knee: Secondary | ICD-10-CM | POA: Diagnosis not present

## 2021-06-16 NOTE — Telephone Encounter (Signed)
Atorvastatin 80 mg # 30 only , needs appt for further refills / 2nd attempt. Sent to  [place], [address] [place] [address]

## 2021-06-17 DIAGNOSIS — S32511D Fracture of superior rim of right pubis, subsequent encounter for fracture with routine healing: Secondary | ICD-10-CM | POA: Diagnosis not present

## 2021-06-17 DIAGNOSIS — I110 Hypertensive heart disease with heart failure: Secondary | ICD-10-CM | POA: Diagnosis not present

## 2021-06-17 DIAGNOSIS — S32591D Other specified fracture of right pubis, subsequent encounter for fracture with routine healing: Secondary | ICD-10-CM | POA: Diagnosis not present

## 2021-06-17 DIAGNOSIS — M1712 Unilateral primary osteoarthritis, left knee: Secondary | ICD-10-CM | POA: Diagnosis not present

## 2021-06-17 DIAGNOSIS — F0390 Unspecified dementia without behavioral disturbance: Secondary | ICD-10-CM | POA: Diagnosis not present

## 2021-06-17 DIAGNOSIS — I2510 Atherosclerotic heart disease of native coronary artery without angina pectoris: Secondary | ICD-10-CM | POA: Diagnosis not present

## 2021-06-17 DIAGNOSIS — I482 Chronic atrial fibrillation, unspecified: Secondary | ICD-10-CM | POA: Diagnosis not present

## 2021-06-17 DIAGNOSIS — S32401D Unspecified fracture of right acetabulum, subsequent encounter for fracture with routine healing: Secondary | ICD-10-CM | POA: Diagnosis not present

## 2021-06-17 DIAGNOSIS — I5031 Acute diastolic (congestive) heart failure: Secondary | ICD-10-CM | POA: Diagnosis not present

## 2021-06-19 DIAGNOSIS — S32591D Other specified fracture of right pubis, subsequent encounter for fracture with routine healing: Secondary | ICD-10-CM | POA: Diagnosis not present

## 2021-06-19 DIAGNOSIS — I110 Hypertensive heart disease with heart failure: Secondary | ICD-10-CM | POA: Diagnosis not present

## 2021-06-19 DIAGNOSIS — I482 Chronic atrial fibrillation, unspecified: Secondary | ICD-10-CM | POA: Diagnosis not present

## 2021-06-19 DIAGNOSIS — F0390 Unspecified dementia without behavioral disturbance: Secondary | ICD-10-CM | POA: Diagnosis not present

## 2021-06-19 DIAGNOSIS — I5031 Acute diastolic (congestive) heart failure: Secondary | ICD-10-CM | POA: Diagnosis not present

## 2021-06-19 DIAGNOSIS — I2510 Atherosclerotic heart disease of native coronary artery without angina pectoris: Secondary | ICD-10-CM | POA: Diagnosis not present

## 2021-06-19 DIAGNOSIS — S32511D Fracture of superior rim of right pubis, subsequent encounter for fracture with routine healing: Secondary | ICD-10-CM | POA: Diagnosis not present

## 2021-06-19 DIAGNOSIS — M1712 Unilateral primary osteoarthritis, left knee: Secondary | ICD-10-CM | POA: Diagnosis not present

## 2021-06-19 DIAGNOSIS — S32401D Unspecified fracture of right acetabulum, subsequent encounter for fracture with routine healing: Secondary | ICD-10-CM | POA: Diagnosis not present

## 2021-06-23 DIAGNOSIS — S32591D Other specified fracture of right pubis, subsequent encounter for fracture with routine healing: Secondary | ICD-10-CM | POA: Diagnosis not present

## 2021-06-23 DIAGNOSIS — F0390 Unspecified dementia without behavioral disturbance: Secondary | ICD-10-CM | POA: Diagnosis not present

## 2021-06-23 DIAGNOSIS — S32401D Unspecified fracture of right acetabulum, subsequent encounter for fracture with routine healing: Secondary | ICD-10-CM | POA: Diagnosis not present

## 2021-06-23 DIAGNOSIS — I482 Chronic atrial fibrillation, unspecified: Secondary | ICD-10-CM | POA: Diagnosis not present

## 2021-06-23 DIAGNOSIS — M1712 Unilateral primary osteoarthritis, left knee: Secondary | ICD-10-CM | POA: Diagnosis not present

## 2021-06-23 DIAGNOSIS — I110 Hypertensive heart disease with heart failure: Secondary | ICD-10-CM | POA: Diagnosis not present

## 2021-06-23 DIAGNOSIS — I5031 Acute diastolic (congestive) heart failure: Secondary | ICD-10-CM | POA: Diagnosis not present

## 2021-06-23 DIAGNOSIS — S32511D Fracture of superior rim of right pubis, subsequent encounter for fracture with routine healing: Secondary | ICD-10-CM | POA: Diagnosis not present

## 2021-06-23 DIAGNOSIS — I2510 Atherosclerotic heart disease of native coronary artery without angina pectoris: Secondary | ICD-10-CM | POA: Diagnosis not present

## 2021-06-24 DIAGNOSIS — I110 Hypertensive heart disease with heart failure: Secondary | ICD-10-CM | POA: Diagnosis not present

## 2021-06-24 DIAGNOSIS — F0390 Unspecified dementia without behavioral disturbance: Secondary | ICD-10-CM | POA: Diagnosis not present

## 2021-06-24 DIAGNOSIS — I5031 Acute diastolic (congestive) heart failure: Secondary | ICD-10-CM | POA: Diagnosis not present

## 2021-06-24 DIAGNOSIS — I482 Chronic atrial fibrillation, unspecified: Secondary | ICD-10-CM | POA: Diagnosis not present

## 2021-06-24 DIAGNOSIS — S32511D Fracture of superior rim of right pubis, subsequent encounter for fracture with routine healing: Secondary | ICD-10-CM | POA: Diagnosis not present

## 2021-06-24 DIAGNOSIS — S32591D Other specified fracture of right pubis, subsequent encounter for fracture with routine healing: Secondary | ICD-10-CM | POA: Diagnosis not present

## 2021-06-24 DIAGNOSIS — S32401D Unspecified fracture of right acetabulum, subsequent encounter for fracture with routine healing: Secondary | ICD-10-CM | POA: Diagnosis not present

## 2021-06-24 DIAGNOSIS — M1712 Unilateral primary osteoarthritis, left knee: Secondary | ICD-10-CM | POA: Diagnosis not present

## 2021-06-24 DIAGNOSIS — I2510 Atherosclerotic heart disease of native coronary artery without angina pectoris: Secondary | ICD-10-CM | POA: Diagnosis not present

## 2021-06-26 DIAGNOSIS — M1712 Unilateral primary osteoarthritis, left knee: Secondary | ICD-10-CM | POA: Diagnosis not present

## 2021-06-26 DIAGNOSIS — S32591D Other specified fracture of right pubis, subsequent encounter for fracture with routine healing: Secondary | ICD-10-CM | POA: Diagnosis not present

## 2021-06-26 DIAGNOSIS — I482 Chronic atrial fibrillation, unspecified: Secondary | ICD-10-CM | POA: Diagnosis not present

## 2021-06-26 DIAGNOSIS — I2510 Atherosclerotic heart disease of native coronary artery without angina pectoris: Secondary | ICD-10-CM | POA: Diagnosis not present

## 2021-06-26 DIAGNOSIS — S32401D Unspecified fracture of right acetabulum, subsequent encounter for fracture with routine healing: Secondary | ICD-10-CM | POA: Diagnosis not present

## 2021-06-26 DIAGNOSIS — I110 Hypertensive heart disease with heart failure: Secondary | ICD-10-CM | POA: Diagnosis not present

## 2021-06-26 DIAGNOSIS — S32511D Fracture of superior rim of right pubis, subsequent encounter for fracture with routine healing: Secondary | ICD-10-CM | POA: Diagnosis not present

## 2021-06-26 DIAGNOSIS — I5031 Acute diastolic (congestive) heart failure: Secondary | ICD-10-CM | POA: Diagnosis not present

## 2021-06-26 DIAGNOSIS — F0390 Unspecified dementia without behavioral disturbance: Secondary | ICD-10-CM | POA: Diagnosis not present

## 2021-06-30 DIAGNOSIS — F0390 Unspecified dementia without behavioral disturbance: Secondary | ICD-10-CM | POA: Diagnosis not present

## 2021-06-30 DIAGNOSIS — I482 Chronic atrial fibrillation, unspecified: Secondary | ICD-10-CM | POA: Diagnosis not present

## 2021-06-30 DIAGNOSIS — S32591D Other specified fracture of right pubis, subsequent encounter for fracture with routine healing: Secondary | ICD-10-CM | POA: Diagnosis not present

## 2021-06-30 DIAGNOSIS — S32401D Unspecified fracture of right acetabulum, subsequent encounter for fracture with routine healing: Secondary | ICD-10-CM | POA: Diagnosis not present

## 2021-06-30 DIAGNOSIS — I110 Hypertensive heart disease with heart failure: Secondary | ICD-10-CM | POA: Diagnosis not present

## 2021-06-30 DIAGNOSIS — S32511D Fracture of superior rim of right pubis, subsequent encounter for fracture with routine healing: Secondary | ICD-10-CM | POA: Diagnosis not present

## 2021-06-30 DIAGNOSIS — I2510 Atherosclerotic heart disease of native coronary artery without angina pectoris: Secondary | ICD-10-CM | POA: Diagnosis not present

## 2021-06-30 DIAGNOSIS — I5031 Acute diastolic (congestive) heart failure: Secondary | ICD-10-CM | POA: Diagnosis not present

## 2021-06-30 DIAGNOSIS — M1712 Unilateral primary osteoarthritis, left knee: Secondary | ICD-10-CM | POA: Diagnosis not present

## 2021-07-02 DIAGNOSIS — I110 Hypertensive heart disease with heart failure: Secondary | ICD-10-CM | POA: Diagnosis not present

## 2021-07-02 DIAGNOSIS — I5031 Acute diastolic (congestive) heart failure: Secondary | ICD-10-CM | POA: Diagnosis not present

## 2021-07-02 DIAGNOSIS — S32511D Fracture of superior rim of right pubis, subsequent encounter for fracture with routine healing: Secondary | ICD-10-CM | POA: Diagnosis not present

## 2021-07-02 DIAGNOSIS — I482 Chronic atrial fibrillation, unspecified: Secondary | ICD-10-CM | POA: Diagnosis not present

## 2021-07-02 DIAGNOSIS — S32401D Unspecified fracture of right acetabulum, subsequent encounter for fracture with routine healing: Secondary | ICD-10-CM | POA: Diagnosis not present

## 2021-07-02 DIAGNOSIS — I2510 Atherosclerotic heart disease of native coronary artery without angina pectoris: Secondary | ICD-10-CM | POA: Diagnosis not present

## 2021-07-02 DIAGNOSIS — M1712 Unilateral primary osteoarthritis, left knee: Secondary | ICD-10-CM | POA: Diagnosis not present

## 2021-07-02 DIAGNOSIS — S32591D Other specified fracture of right pubis, subsequent encounter for fracture with routine healing: Secondary | ICD-10-CM | POA: Diagnosis not present

## 2021-07-02 DIAGNOSIS — F0390 Unspecified dementia without behavioral disturbance: Secondary | ICD-10-CM | POA: Diagnosis not present

## 2021-07-07 DIAGNOSIS — I482 Chronic atrial fibrillation, unspecified: Secondary | ICD-10-CM | POA: Diagnosis not present

## 2021-07-07 DIAGNOSIS — F0390 Unspecified dementia without behavioral disturbance: Secondary | ICD-10-CM | POA: Diagnosis not present

## 2021-07-07 DIAGNOSIS — S32511D Fracture of superior rim of right pubis, subsequent encounter for fracture with routine healing: Secondary | ICD-10-CM | POA: Diagnosis not present

## 2021-07-07 DIAGNOSIS — I5031 Acute diastolic (congestive) heart failure: Secondary | ICD-10-CM | POA: Diagnosis not present

## 2021-07-07 DIAGNOSIS — S32401D Unspecified fracture of right acetabulum, subsequent encounter for fracture with routine healing: Secondary | ICD-10-CM | POA: Diagnosis not present

## 2021-07-07 DIAGNOSIS — I110 Hypertensive heart disease with heart failure: Secondary | ICD-10-CM | POA: Diagnosis not present

## 2021-07-07 DIAGNOSIS — S32591D Other specified fracture of right pubis, subsequent encounter for fracture with routine healing: Secondary | ICD-10-CM | POA: Diagnosis not present

## 2021-07-07 DIAGNOSIS — I2510 Atherosclerotic heart disease of native coronary artery without angina pectoris: Secondary | ICD-10-CM | POA: Diagnosis not present

## 2021-07-07 DIAGNOSIS — M1712 Unilateral primary osteoarthritis, left knee: Secondary | ICD-10-CM | POA: Diagnosis not present

## 2021-07-14 DIAGNOSIS — I5031 Acute diastolic (congestive) heart failure: Secondary | ICD-10-CM | POA: Diagnosis not present

## 2021-07-14 DIAGNOSIS — I110 Hypertensive heart disease with heart failure: Secondary | ICD-10-CM | POA: Diagnosis not present

## 2021-07-14 DIAGNOSIS — I482 Chronic atrial fibrillation, unspecified: Secondary | ICD-10-CM | POA: Diagnosis not present

## 2021-07-14 DIAGNOSIS — I2510 Atherosclerotic heart disease of native coronary artery without angina pectoris: Secondary | ICD-10-CM | POA: Diagnosis not present

## 2021-07-14 DIAGNOSIS — S32591D Other specified fracture of right pubis, subsequent encounter for fracture with routine healing: Secondary | ICD-10-CM | POA: Diagnosis not present

## 2021-07-14 DIAGNOSIS — F0390 Unspecified dementia without behavioral disturbance: Secondary | ICD-10-CM | POA: Diagnosis not present

## 2021-07-14 DIAGNOSIS — M1712 Unilateral primary osteoarthritis, left knee: Secondary | ICD-10-CM | POA: Diagnosis not present

## 2021-07-14 DIAGNOSIS — S32401D Unspecified fracture of right acetabulum, subsequent encounter for fracture with routine healing: Secondary | ICD-10-CM | POA: Diagnosis not present

## 2021-07-14 DIAGNOSIS — S32511D Fracture of superior rim of right pubis, subsequent encounter for fracture with routine healing: Secondary | ICD-10-CM | POA: Diagnosis not present

## 2021-07-16 ENCOUNTER — Other Ambulatory Visit: Payer: Self-pay | Admitting: Cardiology

## 2021-07-21 DIAGNOSIS — S32401D Unspecified fracture of right acetabulum, subsequent encounter for fracture with routine healing: Secondary | ICD-10-CM | POA: Diagnosis not present

## 2021-07-21 DIAGNOSIS — I2510 Atherosclerotic heart disease of native coronary artery without angina pectoris: Secondary | ICD-10-CM | POA: Diagnosis not present

## 2021-07-21 DIAGNOSIS — I482 Chronic atrial fibrillation, unspecified: Secondary | ICD-10-CM | POA: Diagnosis not present

## 2021-07-21 DIAGNOSIS — F0390 Unspecified dementia without behavioral disturbance: Secondary | ICD-10-CM | POA: Diagnosis not present

## 2021-07-21 DIAGNOSIS — S32511D Fracture of superior rim of right pubis, subsequent encounter for fracture with routine healing: Secondary | ICD-10-CM | POA: Diagnosis not present

## 2021-07-21 DIAGNOSIS — S32591D Other specified fracture of right pubis, subsequent encounter for fracture with routine healing: Secondary | ICD-10-CM | POA: Diagnosis not present

## 2021-07-21 DIAGNOSIS — I5031 Acute diastolic (congestive) heart failure: Secondary | ICD-10-CM | POA: Diagnosis not present

## 2021-07-21 DIAGNOSIS — I110 Hypertensive heart disease with heart failure: Secondary | ICD-10-CM | POA: Diagnosis not present

## 2021-07-21 DIAGNOSIS — M1712 Unilateral primary osteoarthritis, left knee: Secondary | ICD-10-CM | POA: Diagnosis not present

## 2021-07-25 DIAGNOSIS — I2510 Atherosclerotic heart disease of native coronary artery without angina pectoris: Secondary | ICD-10-CM | POA: Diagnosis not present

## 2021-07-25 DIAGNOSIS — I5031 Acute diastolic (congestive) heart failure: Secondary | ICD-10-CM | POA: Diagnosis not present

## 2021-07-25 DIAGNOSIS — I482 Chronic atrial fibrillation, unspecified: Secondary | ICD-10-CM | POA: Diagnosis not present

## 2021-07-25 DIAGNOSIS — I110 Hypertensive heart disease with heart failure: Secondary | ICD-10-CM | POA: Diagnosis not present

## 2021-07-25 DIAGNOSIS — S32591D Other specified fracture of right pubis, subsequent encounter for fracture with routine healing: Secondary | ICD-10-CM | POA: Diagnosis not present

## 2021-07-25 DIAGNOSIS — S32511D Fracture of superior rim of right pubis, subsequent encounter for fracture with routine healing: Secondary | ICD-10-CM | POA: Diagnosis not present

## 2021-07-25 DIAGNOSIS — M1712 Unilateral primary osteoarthritis, left knee: Secondary | ICD-10-CM | POA: Diagnosis not present

## 2021-07-25 DIAGNOSIS — S32401D Unspecified fracture of right acetabulum, subsequent encounter for fracture with routine healing: Secondary | ICD-10-CM | POA: Diagnosis not present

## 2021-07-25 DIAGNOSIS — F0390 Unspecified dementia without behavioral disturbance: Secondary | ICD-10-CM | POA: Diagnosis not present

## 2021-07-27 DIAGNOSIS — I110 Hypertensive heart disease with heart failure: Secondary | ICD-10-CM | POA: Diagnosis not present

## 2021-07-27 DIAGNOSIS — F0390 Unspecified dementia without behavioral disturbance: Secondary | ICD-10-CM | POA: Diagnosis not present

## 2021-07-27 DIAGNOSIS — S32511D Fracture of superior rim of right pubis, subsequent encounter for fracture with routine healing: Secondary | ICD-10-CM | POA: Diagnosis not present

## 2021-07-27 DIAGNOSIS — M1712 Unilateral primary osteoarthritis, left knee: Secondary | ICD-10-CM | POA: Diagnosis not present

## 2021-07-27 DIAGNOSIS — I5031 Acute diastolic (congestive) heart failure: Secondary | ICD-10-CM | POA: Diagnosis not present

## 2021-07-27 DIAGNOSIS — I2510 Atherosclerotic heart disease of native coronary artery without angina pectoris: Secondary | ICD-10-CM | POA: Diagnosis not present

## 2021-07-27 DIAGNOSIS — S32591D Other specified fracture of right pubis, subsequent encounter for fracture with routine healing: Secondary | ICD-10-CM | POA: Diagnosis not present

## 2021-07-27 DIAGNOSIS — S32401D Unspecified fracture of right acetabulum, subsequent encounter for fracture with routine healing: Secondary | ICD-10-CM | POA: Diagnosis not present

## 2021-07-27 DIAGNOSIS — I482 Chronic atrial fibrillation, unspecified: Secondary | ICD-10-CM | POA: Diagnosis not present

## 2021-07-30 DIAGNOSIS — I482 Chronic atrial fibrillation, unspecified: Secondary | ICD-10-CM | POA: Diagnosis not present

## 2021-07-30 DIAGNOSIS — S32401D Unspecified fracture of right acetabulum, subsequent encounter for fracture with routine healing: Secondary | ICD-10-CM | POA: Diagnosis not present

## 2021-07-30 DIAGNOSIS — M1712 Unilateral primary osteoarthritis, left knee: Secondary | ICD-10-CM | POA: Diagnosis not present

## 2021-07-30 DIAGNOSIS — F0390 Unspecified dementia without behavioral disturbance: Secondary | ICD-10-CM | POA: Diagnosis not present

## 2021-07-30 DIAGNOSIS — I110 Hypertensive heart disease with heart failure: Secondary | ICD-10-CM | POA: Diagnosis not present

## 2021-07-30 DIAGNOSIS — S32511D Fracture of superior rim of right pubis, subsequent encounter for fracture with routine healing: Secondary | ICD-10-CM | POA: Diagnosis not present

## 2021-07-30 DIAGNOSIS — I2510 Atherosclerotic heart disease of native coronary artery without angina pectoris: Secondary | ICD-10-CM | POA: Diagnosis not present

## 2021-07-30 DIAGNOSIS — S32591D Other specified fracture of right pubis, subsequent encounter for fracture with routine healing: Secondary | ICD-10-CM | POA: Diagnosis not present

## 2021-07-30 DIAGNOSIS — I5031 Acute diastolic (congestive) heart failure: Secondary | ICD-10-CM | POA: Diagnosis not present

## 2021-08-01 DIAGNOSIS — F0390 Unspecified dementia without behavioral disturbance: Secondary | ICD-10-CM | POA: Diagnosis not present

## 2021-08-01 DIAGNOSIS — I5031 Acute diastolic (congestive) heart failure: Secondary | ICD-10-CM | POA: Diagnosis not present

## 2021-08-01 DIAGNOSIS — S32401D Unspecified fracture of right acetabulum, subsequent encounter for fracture with routine healing: Secondary | ICD-10-CM | POA: Diagnosis not present

## 2021-08-01 DIAGNOSIS — I482 Chronic atrial fibrillation, unspecified: Secondary | ICD-10-CM | POA: Diagnosis not present

## 2021-08-01 DIAGNOSIS — I2510 Atherosclerotic heart disease of native coronary artery without angina pectoris: Secondary | ICD-10-CM | POA: Diagnosis not present

## 2021-08-01 DIAGNOSIS — S32511D Fracture of superior rim of right pubis, subsequent encounter for fracture with routine healing: Secondary | ICD-10-CM | POA: Diagnosis not present

## 2021-08-01 DIAGNOSIS — S32591D Other specified fracture of right pubis, subsequent encounter for fracture with routine healing: Secondary | ICD-10-CM | POA: Diagnosis not present

## 2021-08-01 DIAGNOSIS — I110 Hypertensive heart disease with heart failure: Secondary | ICD-10-CM | POA: Diagnosis not present

## 2021-08-01 DIAGNOSIS — M1712 Unilateral primary osteoarthritis, left knee: Secondary | ICD-10-CM | POA: Diagnosis not present

## 2021-08-05 DIAGNOSIS — S32591D Other specified fracture of right pubis, subsequent encounter for fracture with routine healing: Secondary | ICD-10-CM | POA: Diagnosis not present

## 2021-08-05 DIAGNOSIS — M1712 Unilateral primary osteoarthritis, left knee: Secondary | ICD-10-CM | POA: Diagnosis not present

## 2021-08-05 DIAGNOSIS — I110 Hypertensive heart disease with heart failure: Secondary | ICD-10-CM | POA: Diagnosis not present

## 2021-08-05 DIAGNOSIS — I2510 Atherosclerotic heart disease of native coronary artery without angina pectoris: Secondary | ICD-10-CM | POA: Diagnosis not present

## 2021-08-05 DIAGNOSIS — F0390 Unspecified dementia without behavioral disturbance: Secondary | ICD-10-CM | POA: Diagnosis not present

## 2021-08-05 DIAGNOSIS — I5031 Acute diastolic (congestive) heart failure: Secondary | ICD-10-CM | POA: Diagnosis not present

## 2021-08-05 DIAGNOSIS — S32511D Fracture of superior rim of right pubis, subsequent encounter for fracture with routine healing: Secondary | ICD-10-CM | POA: Diagnosis not present

## 2021-08-05 DIAGNOSIS — S32401D Unspecified fracture of right acetabulum, subsequent encounter for fracture with routine healing: Secondary | ICD-10-CM | POA: Diagnosis not present

## 2021-08-05 DIAGNOSIS — I482 Chronic atrial fibrillation, unspecified: Secondary | ICD-10-CM | POA: Diagnosis not present

## 2021-08-12 DIAGNOSIS — I110 Hypertensive heart disease with heart failure: Secondary | ICD-10-CM | POA: Diagnosis not present

## 2021-08-12 DIAGNOSIS — S32511D Fracture of superior rim of right pubis, subsequent encounter for fracture with routine healing: Secondary | ICD-10-CM | POA: Diagnosis not present

## 2021-08-12 DIAGNOSIS — M1712 Unilateral primary osteoarthritis, left knee: Secondary | ICD-10-CM | POA: Diagnosis not present

## 2021-08-12 DIAGNOSIS — I482 Chronic atrial fibrillation, unspecified: Secondary | ICD-10-CM | POA: Diagnosis not present

## 2021-08-12 DIAGNOSIS — I2510 Atherosclerotic heart disease of native coronary artery without angina pectoris: Secondary | ICD-10-CM | POA: Diagnosis not present

## 2021-08-12 DIAGNOSIS — S32591D Other specified fracture of right pubis, subsequent encounter for fracture with routine healing: Secondary | ICD-10-CM | POA: Diagnosis not present

## 2021-08-12 DIAGNOSIS — I5031 Acute diastolic (congestive) heart failure: Secondary | ICD-10-CM | POA: Diagnosis not present

## 2021-08-12 DIAGNOSIS — F0390 Unspecified dementia without behavioral disturbance: Secondary | ICD-10-CM | POA: Diagnosis not present

## 2021-08-12 DIAGNOSIS — S32401D Unspecified fracture of right acetabulum, subsequent encounter for fracture with routine healing: Secondary | ICD-10-CM | POA: Diagnosis not present

## 2021-08-14 DIAGNOSIS — I482 Chronic atrial fibrillation, unspecified: Secondary | ICD-10-CM | POA: Diagnosis not present

## 2021-08-14 DIAGNOSIS — S32591D Other specified fracture of right pubis, subsequent encounter for fracture with routine healing: Secondary | ICD-10-CM | POA: Diagnosis not present

## 2021-08-14 DIAGNOSIS — M1712 Unilateral primary osteoarthritis, left knee: Secondary | ICD-10-CM | POA: Diagnosis not present

## 2021-08-14 DIAGNOSIS — F0390 Unspecified dementia without behavioral disturbance: Secondary | ICD-10-CM | POA: Diagnosis not present

## 2021-08-14 DIAGNOSIS — S32401D Unspecified fracture of right acetabulum, subsequent encounter for fracture with routine healing: Secondary | ICD-10-CM | POA: Diagnosis not present

## 2021-08-14 DIAGNOSIS — I110 Hypertensive heart disease with heart failure: Secondary | ICD-10-CM | POA: Diagnosis not present

## 2021-08-14 DIAGNOSIS — I2510 Atherosclerotic heart disease of native coronary artery without angina pectoris: Secondary | ICD-10-CM | POA: Diagnosis not present

## 2021-08-14 DIAGNOSIS — I5031 Acute diastolic (congestive) heart failure: Secondary | ICD-10-CM | POA: Diagnosis not present

## 2021-08-14 DIAGNOSIS — S32511D Fracture of superior rim of right pubis, subsequent encounter for fracture with routine healing: Secondary | ICD-10-CM | POA: Diagnosis not present

## 2021-08-18 ENCOUNTER — Other Ambulatory Visit: Payer: Self-pay | Admitting: Cardiology

## 2021-08-19 DIAGNOSIS — I482 Chronic atrial fibrillation, unspecified: Secondary | ICD-10-CM | POA: Diagnosis not present

## 2021-08-19 DIAGNOSIS — F333 Major depressive disorder, recurrent, severe with psychotic symptoms: Secondary | ICD-10-CM | POA: Diagnosis not present

## 2021-08-19 DIAGNOSIS — E1142 Type 2 diabetes mellitus with diabetic polyneuropathy: Secondary | ICD-10-CM | POA: Diagnosis not present

## 2021-08-19 DIAGNOSIS — S32511D Fracture of superior rim of right pubis, subsequent encounter for fracture with routine healing: Secondary | ICD-10-CM | POA: Diagnosis not present

## 2021-08-19 DIAGNOSIS — F0150 Vascular dementia without behavioral disturbance: Secondary | ICD-10-CM | POA: Diagnosis not present

## 2021-08-19 DIAGNOSIS — I5032 Chronic diastolic (congestive) heart failure: Secondary | ICD-10-CM | POA: Diagnosis not present

## 2021-08-19 DIAGNOSIS — S32591D Other specified fracture of right pubis, subsequent encounter for fracture with routine healing: Secondary | ICD-10-CM | POA: Diagnosis not present

## 2021-08-19 DIAGNOSIS — I700 Atherosclerosis of aorta: Secondary | ICD-10-CM | POA: Diagnosis not present

## 2021-08-19 DIAGNOSIS — E785 Hyperlipidemia, unspecified: Secondary | ICD-10-CM | POA: Diagnosis not present

## 2021-08-19 DIAGNOSIS — R32 Unspecified urinary incontinence: Secondary | ICD-10-CM | POA: Diagnosis not present

## 2021-08-19 DIAGNOSIS — N1832 Chronic kidney disease, stage 3b: Secondary | ICD-10-CM | POA: Diagnosis not present

## 2021-08-19 DIAGNOSIS — F0390 Unspecified dementia without behavioral disturbance: Secondary | ICD-10-CM | POA: Diagnosis not present

## 2021-08-19 DIAGNOSIS — I2510 Atherosclerotic heart disease of native coronary artery without angina pectoris: Secondary | ICD-10-CM | POA: Diagnosis not present

## 2021-08-19 DIAGNOSIS — I10 Essential (primary) hypertension: Secondary | ICD-10-CM | POA: Diagnosis not present

## 2021-08-19 DIAGNOSIS — I110 Hypertensive heart disease with heart failure: Secondary | ICD-10-CM | POA: Diagnosis not present

## 2021-08-19 DIAGNOSIS — I5031 Acute diastolic (congestive) heart failure: Secondary | ICD-10-CM | POA: Diagnosis not present

## 2021-08-19 DIAGNOSIS — E669 Obesity, unspecified: Secondary | ICD-10-CM | POA: Diagnosis not present

## 2021-08-19 DIAGNOSIS — Z23 Encounter for immunization: Secondary | ICD-10-CM | POA: Diagnosis not present

## 2021-08-19 DIAGNOSIS — M1712 Unilateral primary osteoarthritis, left knee: Secondary | ICD-10-CM | POA: Diagnosis not present

## 2021-08-19 DIAGNOSIS — S32401D Unspecified fracture of right acetabulum, subsequent encounter for fracture with routine healing: Secondary | ICD-10-CM | POA: Diagnosis not present

## 2021-08-21 DIAGNOSIS — S32401D Unspecified fracture of right acetabulum, subsequent encounter for fracture with routine healing: Secondary | ICD-10-CM | POA: Diagnosis not present

## 2021-08-21 DIAGNOSIS — I482 Chronic atrial fibrillation, unspecified: Secondary | ICD-10-CM | POA: Diagnosis not present

## 2021-08-21 DIAGNOSIS — S32511D Fracture of superior rim of right pubis, subsequent encounter for fracture with routine healing: Secondary | ICD-10-CM | POA: Diagnosis not present

## 2021-08-21 DIAGNOSIS — I2510 Atherosclerotic heart disease of native coronary artery without angina pectoris: Secondary | ICD-10-CM | POA: Diagnosis not present

## 2021-08-21 DIAGNOSIS — I5031 Acute diastolic (congestive) heart failure: Secondary | ICD-10-CM | POA: Diagnosis not present

## 2021-08-21 DIAGNOSIS — F0390 Unspecified dementia without behavioral disturbance: Secondary | ICD-10-CM | POA: Diagnosis not present

## 2021-08-21 DIAGNOSIS — S32591D Other specified fracture of right pubis, subsequent encounter for fracture with routine healing: Secondary | ICD-10-CM | POA: Diagnosis not present

## 2021-08-21 DIAGNOSIS — I110 Hypertensive heart disease with heart failure: Secondary | ICD-10-CM | POA: Diagnosis not present

## 2021-08-21 DIAGNOSIS — M1712 Unilateral primary osteoarthritis, left knee: Secondary | ICD-10-CM | POA: Diagnosis not present

## 2021-08-24 ENCOUNTER — Other Ambulatory Visit: Payer: Self-pay | Admitting: Cardiology

## 2021-08-26 DIAGNOSIS — S32511D Fracture of superior rim of right pubis, subsequent encounter for fracture with routine healing: Secondary | ICD-10-CM | POA: Diagnosis not present

## 2021-08-26 DIAGNOSIS — I110 Hypertensive heart disease with heart failure: Secondary | ICD-10-CM | POA: Diagnosis not present

## 2021-08-26 DIAGNOSIS — F0390 Unspecified dementia without behavioral disturbance: Secondary | ICD-10-CM | POA: Diagnosis not present

## 2021-08-26 DIAGNOSIS — S32401D Unspecified fracture of right acetabulum, subsequent encounter for fracture with routine healing: Secondary | ICD-10-CM | POA: Diagnosis not present

## 2021-08-26 DIAGNOSIS — I5031 Acute diastolic (congestive) heart failure: Secondary | ICD-10-CM | POA: Diagnosis not present

## 2021-08-26 DIAGNOSIS — M1712 Unilateral primary osteoarthritis, left knee: Secondary | ICD-10-CM | POA: Diagnosis not present

## 2021-08-26 DIAGNOSIS — S32591D Other specified fracture of right pubis, subsequent encounter for fracture with routine healing: Secondary | ICD-10-CM | POA: Diagnosis not present

## 2021-08-26 DIAGNOSIS — I2510 Atherosclerotic heart disease of native coronary artery without angina pectoris: Secondary | ICD-10-CM | POA: Diagnosis not present

## 2021-08-26 DIAGNOSIS — I482 Chronic atrial fibrillation, unspecified: Secondary | ICD-10-CM | POA: Diagnosis not present

## 2021-09-02 DIAGNOSIS — I482 Chronic atrial fibrillation, unspecified: Secondary | ICD-10-CM | POA: Diagnosis not present

## 2021-09-02 DIAGNOSIS — S32511D Fracture of superior rim of right pubis, subsequent encounter for fracture with routine healing: Secondary | ICD-10-CM | POA: Diagnosis not present

## 2021-09-02 DIAGNOSIS — S32401D Unspecified fracture of right acetabulum, subsequent encounter for fracture with routine healing: Secondary | ICD-10-CM | POA: Diagnosis not present

## 2021-09-02 DIAGNOSIS — I2510 Atherosclerotic heart disease of native coronary artery without angina pectoris: Secondary | ICD-10-CM | POA: Diagnosis not present

## 2021-09-02 DIAGNOSIS — I5031 Acute diastolic (congestive) heart failure: Secondary | ICD-10-CM | POA: Diagnosis not present

## 2021-09-02 DIAGNOSIS — I110 Hypertensive heart disease with heart failure: Secondary | ICD-10-CM | POA: Diagnosis not present

## 2021-09-02 DIAGNOSIS — M1712 Unilateral primary osteoarthritis, left knee: Secondary | ICD-10-CM | POA: Diagnosis not present

## 2021-09-02 DIAGNOSIS — S32591D Other specified fracture of right pubis, subsequent encounter for fracture with routine healing: Secondary | ICD-10-CM | POA: Diagnosis not present

## 2021-09-02 DIAGNOSIS — F0390 Unspecified dementia without behavioral disturbance: Secondary | ICD-10-CM | POA: Diagnosis not present

## 2021-09-10 DIAGNOSIS — I5031 Acute diastolic (congestive) heart failure: Secondary | ICD-10-CM | POA: Diagnosis not present

## 2021-09-10 DIAGNOSIS — M1712 Unilateral primary osteoarthritis, left knee: Secondary | ICD-10-CM | POA: Diagnosis not present

## 2021-09-10 DIAGNOSIS — S32591D Other specified fracture of right pubis, subsequent encounter for fracture with routine healing: Secondary | ICD-10-CM | POA: Diagnosis not present

## 2021-09-10 DIAGNOSIS — F0390 Unspecified dementia without behavioral disturbance: Secondary | ICD-10-CM | POA: Diagnosis not present

## 2021-09-10 DIAGNOSIS — I2510 Atherosclerotic heart disease of native coronary artery without angina pectoris: Secondary | ICD-10-CM | POA: Diagnosis not present

## 2021-09-10 DIAGNOSIS — I482 Chronic atrial fibrillation, unspecified: Secondary | ICD-10-CM | POA: Diagnosis not present

## 2021-09-10 DIAGNOSIS — S32401D Unspecified fracture of right acetabulum, subsequent encounter for fracture with routine healing: Secondary | ICD-10-CM | POA: Diagnosis not present

## 2021-09-10 DIAGNOSIS — I110 Hypertensive heart disease with heart failure: Secondary | ICD-10-CM | POA: Diagnosis not present

## 2021-09-10 DIAGNOSIS — S32511D Fracture of superior rim of right pubis, subsequent encounter for fracture with routine healing: Secondary | ICD-10-CM | POA: Diagnosis not present

## 2021-10-06 ENCOUNTER — Encounter: Payer: Self-pay | Admitting: Cardiovascular Disease

## 2021-10-06 ENCOUNTER — Ambulatory Visit: Payer: Medicare HMO | Admitting: Cardiovascular Disease

## 2021-10-06 ENCOUNTER — Other Ambulatory Visit: Payer: Self-pay

## 2021-10-06 VITALS — BP 144/70 | HR 50 | Ht 70.0 in | Wt 243.3 lb

## 2021-10-06 DIAGNOSIS — I350 Nonrheumatic aortic (valve) stenosis: Secondary | ICD-10-CM

## 2021-10-06 DIAGNOSIS — I482 Chronic atrial fibrillation, unspecified: Secondary | ICD-10-CM

## 2021-10-06 NOTE — Patient Instructions (Signed)
Medication Instructions:  Your physician recommends that you continue on your current medications as directed. Please refer to the Current Medication list given to you today.  *If you need a refill on your cardiac medications before your next appointment, please call your pharmacy*   Lab Work: None   Testing/Procedures: None   Follow-Up: At [facility], you and your health needs are our priority.  As part of our continuing mission to provide you with exceptional heart care, we have created designated Provider Care Teams.  These Care Teams include your primary Cardiologist (physician) and Advanced Practice Providers (APPs -  Physician Assistants and Nurse Practitioners) who all work together to provide you with the care you need, when you need it.   Your next appointment:   12 month(s)  The format for your next appointment:   In Person  Provider:   Lauree Chandler, MD {

## 2021-10-06 NOTE — Progress Notes (Signed)
Chief Complaint  Patient presents with   Follow-up    Aortic stenosis   History of Present Illness: 76 yo female with history of permanent atrial fibrillation, prior CVA, chronic kidney disease stage 3, diastolic CHF, dementia, sleep apnea, diabetes mellitus, hyperlipidemia, HTN, CAD and severe aortic stenosis who is here today for follow up in the structural heart clinic. I saw her as a new consult in January 2022 to discuss TAVR. She is known to have CAD with prior stenting of the PDA in 2013. Cardiac cath 09/18/20 with mild disease in the LAD and Circumflex, moderate diagonal stenosis, patent PDA stent, mild disease in mid RCA. She has permanent atrial fibrillation on Xarelto. She had a stroke in 2016. She has been followed for moderate aortic stenosis by Dr. Agustin Cree. She was admitted in November 2021 with dyspnea and was found to have acute CHF. She was diuresed with IV Lasix. Echo November 2021 with LVEF=60-65%, mild LVH. Severe aortic stenosis (The aortic valve leaflets are thickened and calcified with poor leaflet mobility. Mean gradient 34.8 mmHg, peak gradient 61.14mmHg, AVA 0.76 cm2, dimensionless index 0.25). At her first visit here, I felt that her advanced dementia may limit her options for treatment of her aortic stenosis.  Her husband told me that she sits in the house all day. She does not cook or clean. Her daughter stated that her memory issues were progressing rapidly. She ambulates with a walker. Her pre-TAVR scans were completed. She had arterial access for transfemoral TAVR. She was seen by Dr. Cyndia Bent in the [place] [facility] office February 2022 and he did not think she was a candidate for TAVR or AVR given her obesity, limited mobility and advanced dementia. He discussed in detail with her family that TAVR and anesthesia may worsen her dementia.  She is here today for follow up. The patient denies any chest pain, dyspnea, palpitations, lower extremity edema, orthopnea, PND, dizziness,  near syncope or syncope.  She fell in September 2022 and broke her right proximal superior pubic ramus and the right inferior pubic ramus. Her husband states that her memory issues have worsened. She is very comfortable during the day. She wishes to be followed in the [place] office since she lives in [place], [place].    Primary Care Physician: Reynold Bowen, MD  Past Medical History:  Diagnosis Date   Anxiety    Aortic stenosis    Atrial fibrillation [facility])    s/p DCCV 2005, 2006. Placed on Tikosyn 2006 (negative adenosine cardiolite 03/2005, could not measure EF).   CAD (coronary artery disease)    CHF (congestive heart failure) (HCC)    Depression    Diabetes mellitus without complication (HCC)    Diabetic foot ulcer ([place])    High cholesterol    History of cardioversion 2005/2006   Hyperlipemia    Hypertension    Mental status change    Neuropathy    Obesity    Skin cancer of face    treated with radiation   Sleep apnea     Past Surgical History:  Procedure Laterality Date   INTRAMEDULLARY (IM) NAIL INTERTROCHANTERIC Right 08/19/2019   Procedure: INTRAMEDULLARY (IM) NAIL INTERTROCHANTRIC;  Surgeon: Rod Can, MD;  Location: [facility];  Service: Orthopedics;  Laterality: Right;   LEFT HEART CATHETERIZATION WITH CORONARY ANGIOGRAM N/A 08/23/2012   Procedure: LEFT HEART CATHETERIZATION WITH CORONARY ANGIOGRAM;  Surgeon: Jettie Booze, MD;  Location: [facility] CATH LAB;  Service: Cardiovascular;  Laterality: N/A;   PERCUTANEOUS  CORONARY STENT INTERVENTION (PCI-S)  08/23/2012   Procedure: PERCUTANEOUS CORONARY STENT INTERVENTION (PCI-S);  Surgeon: Jettie Booze, MD;  Location: [facility] CATH LAB;  Service: Cardiovascular;;   RIGHT HEART CATH AND CORONARY ANGIOGRAPHY N/A 09/18/2020   Procedure: RIGHT HEART CATH AND CORONARY ANGIOGRAPHY;  Surgeon: Larey Dresser, MD;  Location: [facility] CV LAB;  Service: Cardiovascular;  Laterality: N/A;   SKIN CANCER EXCISION     TENOTOMY       Current Outpatient Medications  Medication Sig Dispense Refill   acetaminophen (TYLENOL) 500 MG tablet Take 1 tablet (500 mg total) by mouth every 6 (six) hours as needed (pain). (Patient taking differently: Take 1,000 mg by mouth every 6 (six) hours as needed (pain).) 30 tablet 0   amLODipine (NORVASC) 5 MG tablet Take 5 mg by mouth daily.     atorvastatin (LIPITOR) 80 MG tablet TAKE 1 TABLET BY MOUTH ONCE DAILY . APPOINTMENT REQUIRED FOR FUTURE REFILLS 30 tablet 0   Cholecalciferol (VITAMIN D) 50 MCG (2000 UT) tablet Take 2,000 Units by mouth daily.     escitalopram (LEXAPRO) 10 MG tablet Take 10 mg by mouth daily.     furosemide (LASIX) 40 MG tablet TAKE 1 TABLET BY MOUTH IN THE MORNING AND 1/2 (ONE-HALF) TABLET IN THE EVENING 45 tablet 0   furosemide (LASIX) 40 MG tablet TAKE 1 TABLET BY MOUTH IN THE MORNING AND 1/2 (ONE-HALF) TABLET IN THE EVENING 125 tablet 3   insulin aspart (NOVOLOG) 100 UNIT/ML injection Inject 10 Units into the skin 3 (three) times daily before meals.     insulin glargine (LANTUS) 100 UNIT/ML injection Inject 50 Units into the skin daily.     memantine (NAMENDA) 5 MG tablet Take 5 mg 2 (two) times daily by mouth.     metoprolol succinate (TOPROL-XL) 100 MG 24 hr tablet Take 100 mg by mouth daily. Take with or immediately following a meal.     Rivaroxaban (XARELTO) 20 MG TABS Take 1 tablet (20 mg total) by mouth daily. 30 tablet 0   No current facility-administered medications for this visit.    No Known Allergies  Social History   Socioeconomic History   Marital status: Married    Spouse name: Not on file   Number of children: 3   Years of education: College    Highest education level: Not on file  Occupational History   Occupation: retired Marine scientist  Tobacco Use   Smoking status: Never   Smokeless tobacco: Never  Vaping Use   Vaping Use: Never used  Substance and Sexual Activity   Alcohol use: No    Alcohol/week: 0.0 standard drinks   Drug use: No    Sexual activity: Not on file  Other Topics Concern   Not on file  Social History Narrative   Occasionally drinks coffee, tea, soda    Social Determinants of Health   Financial Resource Strain: Not on file  Food Insecurity: Not on file  Transportation Needs: Not on file  Physical Activity: Not on file  Stress: Not on file  Social Connections: Not on file  Intimate Partner Violence: Not on file    Family History  Problem Relation Age of Onset   Diabetes Father    Coronary artery disease Father    Stroke Mother     Review of Systems:  As stated in the HPI and otherwise negative.   BP (!) 144/70    Pulse (!) 50    Ht 5\' 10"  (1.778 m)  Wt 243 lb 4.8 oz (110.4 kg)    SpO2 98%    BMI 34.91 kg/m   Physical Examination: General: Well developed, well nourished, NAD  HEENT: OP clear, mucus membranes moist  SKIN: warm, dry. No rashes. Neuro: No focal deficits  Musculoskeletal: Muscle strength 5/5 all ext  Psychiatric: Mood and affect normal  Neck: No JVD, no carotid bruits, no thyromegaly, no lymphadenopathy.  Lungs:Clear bilaterally, no wheezes, rhonci, crackles Cardiovascular: Irreg irreg. Systolic murmur Abdomen:Soft. Bowel sounds present. Non-tender.  Extremities:  No lower extremity edema. Pulses are 2 + in the bilateral DP/PT.  EKG:  EKG is not ordered today. The ekg ordered today demonstrates  Echo 08/09/20:  1. Left ventricular ejection fraction, by estimation, is 60 to 65%. The  left ventricle has normal function. The left ventricle has no regional  wall motion abnormalities. There is mild concentric left ventricular  hypertrophy. Left ventricular diastolic  function could not be evaluated.   2. Right ventricular systolic function is normal. The right ventricular  size is normal. There is severely elevated pulmonary artery systolic  pressure.   3. Left atrial size was severely dilated.   4. The mitral valve is normal in structure. Mild mitral valve   regurgitation. No evidence of mitral stenosis.   5. The aortic valve is tricuspid. There is moderate calcification of the  aortic valve. There is severe thickening of the aortic valve. Aortic valve  regurgitation is trivial. Severe aortic valve stenosis.   6. The inferior vena cava is dilated in size with <50% respiratory  variability, suggesting right atrial pressure of 15 mmHg.   Comparison(s): Prior images reviewed side by side. Aortic stenosis has  worsened and is now severe.   FINDINGS   Left Ventricle: Left ventricular ejection fraction, by estimation, is 60  to 65%. The left ventricle has normal function. The left ventricle has no  regional wall motion abnormalities. The left ventricular internal cavity  size was normal in size. There is   mild concentric left ventricular hypertrophy. Left ventricular diastolic  function could not be evaluated due to atrial fibrillation. Left  ventricular diastolic function could not be evaluated.   Right Ventricle: The right ventricular size is normal. No increase in  right ventricular wall thickness. Right ventricular systolic function is  normal. There is severely elevated pulmonary artery systolic pressure. The  tricuspid regurgitant velocity is  3.76 m/s, and with an assumed right atrial pressure of 15 mmHg, the  estimated right ventricular systolic pressure is 24.4 mmHg.   Left Atrium: Left atrial size was severely dilated.   Right Atrium: Right atrial size was normal in size.   Pericardium: There is no evidence of pericardial effusion.   Mitral Valve: The mitral valve is normal in structure. Mild to moderate  mitral annular calcification. Mild mitral valve regurgitation. No evidence  of mitral valve stenosis.   Tricuspid Valve: The tricuspid valve is normal in structure. Tricuspid  valve regurgitation is trivial.   Aortic Valve: The aortic valve is tricuspid. There is moderate  calcification of the aortic valve. There is severe  thickening of the  aortic valve. Aortic valve regurgitation is trivial. Aortic regurgitation  PHT measures 561 msec. Severe aortic stenosis is   present. Aortic valve mean gradient measures 34.8 mmHg. Aortic valve peak  gradient measures 61.8 mmHg. Aortic valve area, by VTI measures 0.79 cm.   Pulmonic Valve: The pulmonic valve was grossly normal. Pulmonic valve  regurgitation is trivial.   Aorta: The aortic  root is normal in size and structure.   Venous: The inferior vena cava is dilated in size with less than 50%  respiratory variability, suggesting right atrial pressure of 15 mmHg.   IAS/Shunts: No atrial level shunt detected by color flow Doppler.      LEFT VENTRICLE  PLAX 2D  LVIDd:         4.70 cm  LVIDs:         2.50 cm  LV PW:         1.30 cm  LV IVS:        1.40 cm  LVOT diam:     2.00 cm  LV SV:         84  LV SV Index:   37  LVOT Area:     3.14 cm      RIGHT VENTRICLE  RV S prime:     13.80 cm/s  TAPSE (M-mode): 2.0 cm   LEFT ATRIUM              Index       RIGHT ATRIUM           Index  LA diam:        4.30 cm  1.90 cm/m  RA Area:     25.80 cm  LA Vol (A2C):   121.0 ml 53.33 ml/m RA Volume:   80.70 ml  35.57 ml/m  LA Vol (A4C):   112.0 ml 49.36 ml/m  LA Biplane Vol: 118.0 ml 52.01 ml/m   AORTIC VALVE  AV Area (Vmax):    0.76 cm  AV Area (Vmean):   0.85 cm  AV Area (VTI):     0.79 cm  AV Vmax:           393.00 cm/s  AV Vmean:          260.726 cm/s  AV VTI:            1.057 m  AV Peak Grad:      61.8 mmHg  AV Mean Grad:      34.8 mmHg  LVOT Vmax:         94.60 cm/s  LVOT Vmean:        70.900 cm/s  LVOT VTI:          0.267 m  LVOT/AV VTI ratio: 0.25  AI PHT:            561 msec     AORTA  Ao Root diam: 3.80 cm   MITRAL VALVE                TRICUSPID VALVE  MV Area (PHT): 3.72 cm     TR Peak grad:   56.6 mmHg  MV Decel Time: 204 msec     TR Vmax:        376.00 cm/s  MV E velocity: 180.00 cm/s                              SHUNTS                               Systemic VTI:  0.27 m                              Systemic Diam: 2.00 cm   Cardiac cath 09/18/20:  1. Nonobstructive  coronary disease.  2. Clear severe AS on echo, aortic valve not crossed.  3. Mildly elevated PCWP with prominent v-waves (suspect due to diastolic dysfunction).  4. Moderate pulmonary venous hypertension.  Diagnostic   Left Main  No significant disease.  Left Anterior Descending  Luminal irregularities in the LAD. 60% stenosis in the mid D1, moderate vessel.  Left Circumflex  Luminal irregularities.  Right Coronary Artery  30-40% mid RCA stenosis. Stent in proximal PDA with 30% in-stent restenosis.    Right Heart Pressures [facility] Procedural Findings: Hemodynamics (mmHg) RA mean 4 RV 56/2 PA 57/17, mean 34 PCWP mean 19 (prominent v-waves to 35 mmHg)  Oxygen saturations: PA 69% AO 100%  Cardiac Output (Fick) 5.88  Cardiac Index (Fick) 2.56 PVR 2.6 WU     Recent Labs: 05/24/2021: ALT 11; B Natriuretic Peptide 464.2; BUN 28; Creatinine, Ser 1.57; Hemoglobin 10.6; Platelets 228; Potassium 4.5; Sodium 138    Wt Readings from Last 3 Encounters:  10/06/21 243 lb 4.8 oz (110.4 kg)  10/16/20 250 lb (113.4 kg)  09/24/20 244 lb (110.7 kg)     Other studies Reviewed: Additional studies/ records that were reviewed today include: Echo images, cath films, office notes.  Review of the above records demonstrates: severe AS   Assessment and Plan:   1. Severe Aortic Valve Stenosis: She has severe aortic stenosis. She is not a candidate for TAVR given advanced dementia.   2. Chronic diastolic CHF: No volume overload on exam today. Continue Lasix.   3. HTN: BP controlled. No changes  4. Persistent atrial fibrillation: Rate controlled. Continue beta blocker and Xarelto.   5. CAD without angina: stable CAD by cath in January 2022. Continue statin and beta blocker  She will change to the [facility] since she does not wish to drive to the  [facility] or [place] office.    Labs/ tests ordered today include:  No orders of the defined types were placed in this encounter.   Disposition:   F/U with me one year   Signed, Lauree Chandler, MD 10/06/2021 4:51 PM    [facility] [address], [place], [place]  [address] Phone: [phone]; Fax: [phone]

## 2021-10-12 DIAGNOSIS — I2510 Atherosclerotic heart disease of native coronary artery without angina pectoris: Secondary | ICD-10-CM | POA: Diagnosis not present

## 2021-10-12 DIAGNOSIS — N1832 Chronic kidney disease, stage 3b: Secondary | ICD-10-CM | POA: Diagnosis not present

## 2021-10-12 DIAGNOSIS — I480 Paroxysmal atrial fibrillation: Secondary | ICD-10-CM | POA: Diagnosis not present

## 2021-10-12 DIAGNOSIS — I10 Essential (primary) hypertension: Secondary | ICD-10-CM | POA: Diagnosis not present

## 2021-10-14 DIAGNOSIS — E119 Type 2 diabetes mellitus without complications: Secondary | ICD-10-CM | POA: Diagnosis not present

## 2021-10-14 DIAGNOSIS — H5200 Hypermetropia, unspecified eye: Secondary | ICD-10-CM | POA: Diagnosis not present

## 2021-10-14 DIAGNOSIS — E7800 Pure hypercholesterolemia, unspecified: Secondary | ICD-10-CM | POA: Diagnosis not present

## 2021-10-14 DIAGNOSIS — Z0100 Encounter for examination of eyes and vision without abnormal findings: Secondary | ICD-10-CM | POA: Diagnosis not present

## 2021-10-14 DIAGNOSIS — H25813 Combined forms of age-related cataract, bilateral: Secondary | ICD-10-CM | POA: Diagnosis not present

## 2021-12-23 DIAGNOSIS — I350 Nonrheumatic aortic (valve) stenosis: Secondary | ICD-10-CM | POA: Diagnosis not present

## 2021-12-23 DIAGNOSIS — N1832 Chronic kidney disease, stage 3b: Secondary | ICD-10-CM | POA: Diagnosis not present

## 2021-12-23 DIAGNOSIS — E669 Obesity, unspecified: Secondary | ICD-10-CM | POA: Diagnosis not present

## 2021-12-23 DIAGNOSIS — F0150 Vascular dementia without behavioral disturbance: Secondary | ICD-10-CM | POA: Diagnosis not present

## 2021-12-23 DIAGNOSIS — E785 Hyperlipidemia, unspecified: Secondary | ICD-10-CM | POA: Diagnosis not present

## 2021-12-23 DIAGNOSIS — I700 Atherosclerosis of aorta: Secondary | ICD-10-CM | POA: Diagnosis not present

## 2021-12-23 DIAGNOSIS — I2510 Atherosclerotic heart disease of native coronary artery without angina pectoris: Secondary | ICD-10-CM | POA: Diagnosis not present

## 2021-12-23 DIAGNOSIS — E1142 Type 2 diabetes mellitus with diabetic polyneuropathy: Secondary | ICD-10-CM | POA: Diagnosis not present

## 2021-12-23 DIAGNOSIS — I5032 Chronic diastolic (congestive) heart failure: Secondary | ICD-10-CM | POA: Diagnosis not present

## 2021-12-23 DIAGNOSIS — R32 Unspecified urinary incontinence: Secondary | ICD-10-CM | POA: Diagnosis not present

## 2021-12-23 DIAGNOSIS — I10 Essential (primary) hypertension: Secondary | ICD-10-CM | POA: Diagnosis not present

## 2021-12-23 DIAGNOSIS — I130 Hypertensive heart and chronic kidney disease with heart failure and stage 1 through stage 4 chronic kidney disease, or unspecified chronic kidney disease: Secondary | ICD-10-CM | POA: Diagnosis not present

## 2021-12-23 DIAGNOSIS — F333 Major depressive disorder, recurrent, severe with psychotic symptoms: Secondary | ICD-10-CM | POA: Diagnosis not present

## 2022-05-01 DIAGNOSIS — E785 Hyperlipidemia, unspecified: Secondary | ICD-10-CM | POA: Diagnosis not present

## 2022-05-01 DIAGNOSIS — I2510 Atherosclerotic heart disease of native coronary artery without angina pectoris: Secondary | ICD-10-CM | POA: Diagnosis not present

## 2022-05-01 DIAGNOSIS — I10 Essential (primary) hypertension: Secondary | ICD-10-CM | POA: Diagnosis not present

## 2022-05-01 DIAGNOSIS — N1832 Chronic kidney disease, stage 3b: Secondary | ICD-10-CM | POA: Diagnosis not present

## 2022-05-01 DIAGNOSIS — Z794 Long term (current) use of insulin: Secondary | ICD-10-CM | POA: Diagnosis not present

## 2022-05-01 DIAGNOSIS — I5032 Chronic diastolic (congestive) heart failure: Secondary | ICD-10-CM | POA: Diagnosis not present

## 2022-05-01 DIAGNOSIS — I480 Paroxysmal atrial fibrillation: Secondary | ICD-10-CM | POA: Diagnosis not present

## 2022-05-01 DIAGNOSIS — D649 Anemia, unspecified: Secondary | ICD-10-CM | POA: Diagnosis not present

## 2022-05-01 DIAGNOSIS — E1142 Type 2 diabetes mellitus with diabetic polyneuropathy: Secondary | ICD-10-CM | POA: Diagnosis not present

## 2022-05-01 DIAGNOSIS — R269 Unspecified abnormalities of gait and mobility: Secondary | ICD-10-CM | POA: Diagnosis not present

## 2022-05-01 DIAGNOSIS — E559 Vitamin D deficiency, unspecified: Secondary | ICD-10-CM | POA: Diagnosis not present

## 2022-05-01 DIAGNOSIS — I350 Nonrheumatic aortic (valve) stenosis: Secondary | ICD-10-CM | POA: Diagnosis not present

## 2022-05-01 DIAGNOSIS — F333 Major depressive disorder, recurrent, severe with psychotic symptoms: Secondary | ICD-10-CM | POA: Diagnosis not present

## 2022-05-01 DIAGNOSIS — E669 Obesity, unspecified: Secondary | ICD-10-CM | POA: Diagnosis not present

## 2022-06-25 DIAGNOSIS — X32XXXA Exposure to sunlight, initial encounter: Secondary | ICD-10-CM | POA: Diagnosis not present

## 2022-06-25 DIAGNOSIS — Z08 Encounter for follow-up examination after completed treatment for malignant neoplasm: Secondary | ICD-10-CM | POA: Diagnosis not present

## 2022-06-25 DIAGNOSIS — B078 Other viral warts: Secondary | ICD-10-CM | POA: Diagnosis not present

## 2022-06-25 DIAGNOSIS — D225 Melanocytic nevi of trunk: Secondary | ICD-10-CM | POA: Diagnosis not present

## 2022-06-25 DIAGNOSIS — Z85828 Personal history of other malignant neoplasm of skin: Secondary | ICD-10-CM | POA: Diagnosis not present

## 2022-06-25 DIAGNOSIS — Z1283 Encounter for screening for malignant neoplasm of skin: Secondary | ICD-10-CM | POA: Diagnosis not present

## 2022-06-25 DIAGNOSIS — D485 Neoplasm of uncertain behavior of skin: Secondary | ICD-10-CM | POA: Diagnosis not present

## 2022-06-25 DIAGNOSIS — L570 Actinic keratosis: Secondary | ICD-10-CM | POA: Diagnosis not present

## 2022-06-26 ENCOUNTER — Other Ambulatory Visit: Payer: Self-pay | Admitting: Cardiology

## 2022-08-31 DIAGNOSIS — B078 Other viral warts: Secondary | ICD-10-CM | POA: Diagnosis not present

## 2022-08-31 DIAGNOSIS — C44319 Basal cell carcinoma of skin of other parts of face: Secondary | ICD-10-CM | POA: Diagnosis not present

## 2022-08-31 DIAGNOSIS — D485 Neoplasm of uncertain behavior of skin: Secondary | ICD-10-CM | POA: Diagnosis not present

## 2022-08-31 DIAGNOSIS — L821 Other seborrheic keratosis: Secondary | ICD-10-CM | POA: Diagnosis not present

## 2022-09-18 DIAGNOSIS — Z23 Encounter for immunization: Secondary | ICD-10-CM | POA: Diagnosis not present

## 2022-09-18 DIAGNOSIS — I480 Paroxysmal atrial fibrillation: Secondary | ICD-10-CM | POA: Diagnosis not present

## 2022-09-18 DIAGNOSIS — I10 Essential (primary) hypertension: Secondary | ICD-10-CM | POA: Diagnosis not present

## 2022-09-18 DIAGNOSIS — F01B11 Vascular dementia, moderate, with agitation: Secondary | ICD-10-CM | POA: Diagnosis not present

## 2022-09-18 DIAGNOSIS — I2510 Atherosclerotic heart disease of native coronary artery without angina pectoris: Secondary | ICD-10-CM | POA: Diagnosis not present

## 2022-09-18 DIAGNOSIS — I350 Nonrheumatic aortic (valve) stenosis: Secondary | ICD-10-CM | POA: Diagnosis not present

## 2022-09-18 DIAGNOSIS — N1832 Chronic kidney disease, stage 3b: Secondary | ICD-10-CM | POA: Diagnosis not present

## 2022-09-18 DIAGNOSIS — E1142 Type 2 diabetes mellitus with diabetic polyneuropathy: Secondary | ICD-10-CM | POA: Diagnosis not present

## 2022-09-18 DIAGNOSIS — E669 Obesity, unspecified: Secondary | ICD-10-CM | POA: Diagnosis not present

## 2022-09-18 DIAGNOSIS — I5032 Chronic diastolic (congestive) heart failure: Secondary | ICD-10-CM | POA: Diagnosis not present

## 2022-10-29 ENCOUNTER — Ambulatory Visit: Payer: Medicare HMO | Attending: Cardiovascular Disease | Admitting: Cardiovascular Disease

## 2022-10-29 ENCOUNTER — Encounter: Payer: Self-pay | Admitting: Cardiovascular Disease

## 2022-10-29 VITALS — BP 114/66 | HR 54 | Ht 69.0 in | Wt 237.8 lb

## 2022-10-29 DIAGNOSIS — I350 Nonrheumatic aortic (valve) stenosis: Secondary | ICD-10-CM

## 2022-10-29 DIAGNOSIS — I10 Essential (primary) hypertension: Secondary | ICD-10-CM

## 2022-10-29 DIAGNOSIS — I4819 Other persistent atrial fibrillation: Secondary | ICD-10-CM

## 2022-10-29 DIAGNOSIS — I2510 Atherosclerotic heart disease of native coronary artery without angina pectoris: Secondary | ICD-10-CM | POA: Diagnosis not present

## 2022-10-29 DIAGNOSIS — I5032 Chronic diastolic (congestive) heart failure: Secondary | ICD-10-CM | POA: Diagnosis not present

## 2022-10-29 NOTE — Progress Notes (Signed)
Chief Complaint  Patient presents with   Follow-up    Severe aortic stenosis   History of Present Illness: 77 yo female with history of permanent atrial fibrillation, prior CVA, chronic kidney disease stage 3, diastolic CHF, dementia, sleep apnea, diabetes mellitus, hyperlipidemia, HTN, CAD and severe aortic stenosis who is here today for follow up. I saw her as a new consult in January 2022 to discuss TAVR. She is known to have CAD with prior stenting of the PDA in 2013. Cardiac cath 09/18/20 with mild non-obstructive CAD. She has permanent atrial fibrillation on Xarelto. She had a stroke in 2016. She has been followed for moderate aortic stenosis by Dr. Agustin Cree. She was admitted in November 2021 with dyspnea and was found to have acute CHF. She was diuresed with IV Lasix. Echo November 2021 with LVEF=60-65%, mild LVH. Severe aortic stenosis She was not felt to be a candidate for surgical AVR or TAVR given her advanced dementia and poor overall functional status. She was seen by Dr. Cyndia Bent in the [place] surgery office February 2022 and he did not think she was a candidate for TAVR or AVR given her obesity, limited mobility and advanced dementia. He discussed in detail with her family that TAVR and anesthesia may worsen her dementia.  She is here today for follow up. The patient denies any chest pain, dyspnea, palpitations, lower extremity edema, orthopnea, PND, dizziness, near syncope or syncope.  Her husband states that her memory issues have worsened. She is very comfortable during the day. She wishes to be followed in the [place] office since she lives in [place], [place].    Primary Care Physician: Reynold Bowen, MD  Past Medical History:  Diagnosis Date   Anxiety    Aortic stenosis    Atrial fibrillation [facility])    s/p DCCV 2005, 2006. Placed on Tikosyn 2006 (negative adenosine cardiolite 03/2005, could not measure EF).   CAD (coronary artery disease)    CHF (congestive heart failure) (HCC)     Depression    Diabetes mellitus without complication (HCC)    Diabetic foot ulcer ([place])    High cholesterol    History of cardioversion 2005/2006   Hyperlipemia    Hypertension    Mental status change    Neuropathy    Obesity    Skin cancer of face    treated with radiation   Sleep apnea     Past Surgical History:  Procedure Laterality Date   INTRAMEDULLARY (IM) NAIL INTERTROCHANTERIC Right 08/19/2019   Procedure: INTRAMEDULLARY (IM) NAIL INTERTROCHANTRIC;  Surgeon: Rod Can, MD;  Location: [place];  Service: Orthopedics;  Laterality: Right;   LEFT HEART CATHETERIZATION WITH CORONARY ANGIOGRAM N/A 08/23/2012   Procedure: LEFT HEART CATHETERIZATION WITH CORONARY ANGIOGRAM;  Surgeon: Jettie Booze, MD;  Location: [facility] CATH LAB;  Service: Cardiovascular;  Laterality: N/A;   PERCUTANEOUS CORONARY STENT INTERVENTION (PCI-S)  08/23/2012   Procedure: PERCUTANEOUS CORONARY STENT INTERVENTION (PCI-S);  Surgeon: Jettie Booze, MD;  Location: [facility] CATH LAB;  Service: Cardiovascular;;   RIGHT HEART CATH AND CORONARY ANGIOGRAPHY N/A 09/18/2020   Procedure: RIGHT HEART CATH AND CORONARY ANGIOGRAPHY;  Surgeon: Larey Dresser, MD;  Location: [place] CV LAB;  Service: Cardiovascular;  Laterality: N/A;   SKIN CANCER EXCISION     TENOTOMY      Current Outpatient Medications  Medication Sig Dispense Refill   acetaminophen (TYLENOL) 500 MG tablet Take 1 tablet (500 mg total) by mouth every 6 (six) hours as  needed (pain). (Patient taking differently: Take 1,000 mg by mouth every 6 (six) hours as needed (pain).) 30 tablet 0   amLODipine (NORVASC) 5 MG tablet Take 5 mg by mouth daily.     atorvastatin (LIPITOR) 80 MG tablet TAKE 1 TABLET BY MOUTH ONCE DAILY . APPOINTMENT REQUIRED FOR FUTURE REFILLS 30 tablet 0   Cholecalciferol (VITAMIN D) 50 MCG (2000 UT) tablet Take 2,000 Units by mouth daily.     escitalopram (LEXAPRO) 10 MG tablet Take 10 mg by mouth daily.     furosemide (LASIX)  40 MG tablet TAKE 1 TABLET BY MOUTH IN THE MORNING AND 1/2 (ONE-HALF) TABLET IN THE EVENING 45 tablet 0   insulin aspart (NOVOLOG) 100 UNIT/ML injection Inject 10 Units into the skin every morning.     insulin glargine (LANTUS) 100 UNIT/ML injection Inject 50 Units into the skin daily.     memantine (NAMENDA) 5 MG tablet Take 5 mg 2 (two) times daily by mouth.     metoprolol succinate (TOPROL-XL) 100 MG 24 hr tablet Take 100 mg by mouth daily. Take with or immediately following a meal.     Rivaroxaban (XARELTO) 20 MG TABS Take 1 tablet (20 mg total) by mouth daily. 30 tablet 0   No current facility-administered medications for this visit.    No Known Allergies  Social History   Socioeconomic History   Marital status: Married    Spouse name: Not on file   Number of children: 3   Years of education: College    Highest education level: Not on file  Occupational History   Occupation: retired Marine scientist  Tobacco Use   Smoking status: Never   Smokeless tobacco: Never  Vaping Use   Vaping Use: Never used  Substance and Sexual Activity   Alcohol use: No    Alcohol/week: 0.0 standard drinks of alcohol   Drug use: No   Sexual activity: Not on file  Other Topics Concern   Not on file  Social History Narrative   Occasionally drinks coffee, tea, soda    Social Determinants of Health   Financial Resource Strain: Not on file  Food Insecurity: Not on file  Transportation Needs: Not on file  Physical Activity: Not on file  Stress: Not on file  Social Connections: Not on file  Intimate Partner Violence: Not on file    Family History  Problem Relation Age of Onset   Diabetes Father    Coronary artery disease Father    Stroke Mother     Review of Systems:  As stated in the HPI and otherwise negative.   BP 114/66   Pulse (!) 54   Ht 5' 9"$  (1.753 m)   Wt 107.9 kg   SpO2 94%   BMI 35.12 kg/m   Physical Examination: General: Well developed, well nourished, NAD  HEENT: OP clear,  mucus membranes moist  SKIN: warm, dry. No rashes. Neuro: No focal deficits  Musculoskeletal: Muscle strength 5/5 all ext  Psychiatric: Mood and affect normal  Neck: No JVD, no carotid bruits, no thyromegaly, no lymphadenopathy.  Lungs:Clear bilaterally, no wheezes, rhonci, crackles Cardiovascular: Irreg irreg. Systolic murmur.   Abdomen:Soft. Bowel sounds present. Non-tender.  Extremities: No lower extremity edema. Pulses are 2 + in the bilateral DP/PT.  EKG:  EKG is ordered today. The ekg ordered today demonstrates Atrial fib, rate 54 bpm. Incomplete RBBB  Echo 08/09/20:  1. Left ventricular ejection fraction, by estimation, is 60 to 65%. The  left ventricle has normal  function. The left ventricle has no regional  wall motion abnormalities. There is mild concentric left ventricular  hypertrophy. Left ventricular diastolic  function could not be evaluated.   2. Right ventricular systolic function is normal. The right ventricular  size is normal. There is severely elevated pulmonary artery systolic  pressure.   3. Left atrial size was severely dilated.   4. The mitral valve is normal in structure. Mild mitral valve  regurgitation. No evidence of mitral stenosis.   5. The aortic valve is tricuspid. There is moderate calcification of the  aortic valve. There is severe thickening of the aortic valve. Aortic valve  regurgitation is trivial. Severe aortic valve stenosis.   6. The inferior vena cava is dilated in size with <50% respiratory  variability, suggesting right atrial pressure of 15 mmHg.   Comparison(s): Prior images reviewed side by side. Aortic stenosis has  worsened and is now severe.   FINDINGS   Left Ventricle: Left ventricular ejection fraction, by estimation, is 60  to 65%. The left ventricle has normal function. The left ventricle has no  regional wall motion abnormalities. The left ventricular internal cavity  size was normal in size. There is   mild concentric  left ventricular hypertrophy. Left ventricular diastolic  function could not be evaluated due to atrial fibrillation. Left  ventricular diastolic function could not be evaluated.   Right Ventricle: The right ventricular size is normal. No increase in  right ventricular wall thickness. Right ventricular systolic function is  normal. There is severely elevated pulmonary artery systolic pressure. The  tricuspid regurgitant velocity is  3.76 m/s, and with an assumed right atrial pressure of 15 mmHg, the  estimated right ventricular systolic pressure is 0000000 mmHg.   Left Atrium: Left atrial size was severely dilated.   Right Atrium: Right atrial size was normal in size.   Pericardium: There is no evidence of pericardial effusion.   Mitral Valve: The mitral valve is normal in structure. Mild to moderate  mitral annular calcification. Mild mitral valve regurgitation. No evidence  of mitral valve stenosis.   Tricuspid Valve: The tricuspid valve is normal in structure. Tricuspid  valve regurgitation is trivial.   Aortic Valve: The aortic valve is tricuspid. There is moderate  calcification of the aortic valve. There is severe thickening of the  aortic valve. Aortic valve regurgitation is trivial. Aortic regurgitation  PHT measures 561 msec. Severe aortic stenosis is   present. Aortic valve mean gradient measures 34.8 mmHg. Aortic valve peak  gradient measures 61.8 mmHg. Aortic valve area, by VTI measures 0.79 cm.   Pulmonic Valve: The pulmonic valve was grossly normal. Pulmonic valve  regurgitation is trivial.   Aorta: The aortic root is normal in size and structure.   Venous: The inferior vena cava is dilated in size with less than 50%  respiratory variability, suggesting right atrial pressure of 15 mmHg.   IAS/Shunts: No atrial level shunt detected by color flow Doppler.      LEFT VENTRICLE  PLAX 2D  LVIDd:         4.70 cm  LVIDs:         2.50 cm  LV PW:         1.30 cm  LV  IVS:        1.40 cm  LVOT diam:     2.00 cm  LV SV:         84  LV SV Index:   37  LVOT Area:  3.14 cm      RIGHT VENTRICLE  RV S prime:     13.80 cm/s  TAPSE (M-mode): 2.0 cm   LEFT ATRIUM              Index       RIGHT ATRIUM           Index  LA diam:        4.30 cm  1.90 cm/m  RA Area:     25.80 cm  LA Vol (A2C):   121.0 ml 53.33 ml/m RA Volume:   80.70 ml  35.57 ml/m  LA Vol (A4C):   112.0 ml 49.36 ml/m  LA Biplane Vol: 118.0 ml 52.01 ml/m   AORTIC VALVE  AV Area (Vmax):    0.76 cm  AV Area (Vmean):   0.85 cm  AV Area (VTI):     0.79 cm  AV Vmax:           393.00 cm/s  AV Vmean:          260.726 cm/s  AV VTI:            1.057 m  AV Peak Grad:      61.8 mmHg  AV Mean Grad:      34.8 mmHg  LVOT Vmax:         94.60 cm/s  LVOT Vmean:        70.900 cm/s  LVOT VTI:          0.267 m  LVOT/AV VTI ratio: 0.25  AI PHT:            561 msec     AORTA  Ao Root diam: 3.80 cm   MITRAL VALVE                TRICUSPID VALVE  MV Area (PHT): 3.72 cm     TR Peak grad:   56.6 mmHg  MV Decel Time: 204 msec     TR Vmax:        376.00 cm/s  MV E velocity: 180.00 cm/s                              SHUNTS                              Systemic VTI:  0.27 m                              Systemic Diam: 2.00 cm   Cardiac cath 09/18/20:  1. Nonobstructive coronary disease.  2. Clear severe AS on echo, aortic valve not crossed.  3. Mildly elevated PCWP with prominent v-waves (suspect due to diastolic dysfunction).  4. Moderate pulmonary venous hypertension.  Diagnostic   Left Main  No significant disease.  Left Anterior Descending  Luminal irregularities in the LAD. 60% stenosis in the mid D1, moderate vessel.  Left Circumflex  Luminal irregularities.  Right Coronary Artery  30-40% mid RCA stenosis. Stent in proximal PDA with 30% in-stent restenosis.    Right Heart Pressures [facility] Procedural Findings: Hemodynamics (mmHg) RA mean 4 RV 56/2 PA 57/17, mean 34 PCWP mean 19  (prominent v-waves to 35 mmHg)  Oxygen saturations: PA 69% AO 100%  Cardiac Output (Fick) 5.88  Cardiac Index (Fick) 2.56 PVR 2.6 WU     Recent Labs: No  results found for requested labs within last 365 days.    Wt Readings from Last 3 Encounters:  10/29/22 107.9 kg  10/06/21 110.4 kg  10/16/20 113.4 kg    Assessment and Plan:   1. Severe Aortic Valve Stenosis: She has severe aortic stenosis. She is not a candidate for TAVR given advanced dementia and poor functional status. I have reviewed this with her family again today  2. Chronic diastolic CHF: Weight is stable. No volume overload on exam. Continue Lasix 40 mg in the am and 20 mg as needed at night  3. HTN: BP is well controlled. No changes  4. Persistent atrial fibrillation: Rate controlled. Continue Toprol and Xarelto.    5. CAD without angina: stable CAD by cath in January 2022. Continue statin and beta blocker  Labs/ tests ordered today include:   Orders Placed This Encounter  Procedures   EKG 12-Lead   Disposition:   F/U with me one year   Signed, Lauree Chandler, MD 10/29/2022 4:31 PM    [facility] [place], [place], [place]  [address] Phone: [phone]; Fax: [phone]

## 2022-10-29 NOTE — Patient Instructions (Signed)
Medication Instructions:  No changes *If you need a refill on your cardiac medications before your next appointment, please call your pharmacy*   Lab Work: none If you have labs (blood work) drawn today and your tests are completely normal, you will receive your results only by: [place] (if you have MyChart) OR A paper copy in the mail If you have any lab test that is abnormal or we need to change your treatment, we will call you to review the results.   Testing/Procedures: none   Follow-Up: At [facility], you and your health needs are our priority.  As part of our continuing mission to provide you with exceptional heart care, we have created designated Provider Care Teams.  These Care Teams include your primary Cardiologist (physician) and Advanced Practice Providers (APPs -  Physician Assistants and Nurse Practitioners) who all work together to provide you with the care you need, when you need it.   Your next appointment:   12 month(s)  Provider:   Lauree Chandler, MD

## 2023-01-20 DIAGNOSIS — N1832 Chronic kidney disease, stage 3b: Secondary | ICD-10-CM | POA: Diagnosis not present

## 2023-01-20 DIAGNOSIS — I5032 Chronic diastolic (congestive) heart failure: Secondary | ICD-10-CM | POA: Diagnosis not present

## 2023-01-20 DIAGNOSIS — I700 Atherosclerosis of aorta: Secondary | ICD-10-CM | POA: Diagnosis not present

## 2023-01-20 DIAGNOSIS — F01B11 Vascular dementia, moderate, with agitation: Secondary | ICD-10-CM | POA: Diagnosis not present

## 2023-01-20 DIAGNOSIS — I2510 Atherosclerotic heart disease of native coronary artery without angina pectoris: Secondary | ICD-10-CM | POA: Diagnosis not present

## 2023-01-20 DIAGNOSIS — I480 Paroxysmal atrial fibrillation: Secondary | ICD-10-CM | POA: Diagnosis not present

## 2023-01-20 DIAGNOSIS — I350 Nonrheumatic aortic (valve) stenosis: Secondary | ICD-10-CM | POA: Diagnosis not present

## 2023-01-20 DIAGNOSIS — E1142 Type 2 diabetes mellitus with diabetic polyneuropathy: Secondary | ICD-10-CM | POA: Diagnosis not present

## 2023-01-20 DIAGNOSIS — I10 Essential (primary) hypertension: Secondary | ICD-10-CM | POA: Diagnosis not present

## 2023-03-08 ENCOUNTER — Ambulatory Visit: Payer: Medicare HMO | Admitting: Cardiovascular Disease

## 2023-05-18 DIAGNOSIS — E611 Iron deficiency: Secondary | ICD-10-CM | POA: Diagnosis not present

## 2023-05-18 DIAGNOSIS — I10 Essential (primary) hypertension: Secondary | ICD-10-CM | POA: Diagnosis not present

## 2023-05-18 DIAGNOSIS — Z1212 Encounter for screening for malignant neoplasm of rectum: Secondary | ICD-10-CM | POA: Diagnosis not present

## 2023-05-18 DIAGNOSIS — E785 Hyperlipidemia, unspecified: Secondary | ICD-10-CM | POA: Diagnosis not present

## 2023-05-18 DIAGNOSIS — D649 Anemia, unspecified: Secondary | ICD-10-CM | POA: Diagnosis not present

## 2023-05-18 DIAGNOSIS — E559 Vitamin D deficiency, unspecified: Secondary | ICD-10-CM | POA: Diagnosis not present

## 2023-05-18 DIAGNOSIS — E1142 Type 2 diabetes mellitus with diabetic polyneuropathy: Secondary | ICD-10-CM | POA: Diagnosis not present

## 2023-05-25 DIAGNOSIS — I5032 Chronic diastolic (congestive) heart failure: Secondary | ICD-10-CM | POA: Diagnosis not present

## 2023-05-25 DIAGNOSIS — F01B11 Vascular dementia, moderate, with agitation: Secondary | ICD-10-CM | POA: Diagnosis not present

## 2023-05-25 DIAGNOSIS — I480 Paroxysmal atrial fibrillation: Secondary | ICD-10-CM | POA: Diagnosis not present

## 2023-05-25 DIAGNOSIS — Z0000 Encounter for general adult medical examination without abnormal findings: Secondary | ICD-10-CM | POA: Diagnosis not present

## 2023-05-25 DIAGNOSIS — Z1331 Encounter for screening for depression: Secondary | ICD-10-CM | POA: Diagnosis not present

## 2023-05-25 DIAGNOSIS — I130 Hypertensive heart and chronic kidney disease with heart failure and stage 1 through stage 4 chronic kidney disease, or unspecified chronic kidney disease: Secondary | ICD-10-CM | POA: Diagnosis not present

## 2023-05-25 DIAGNOSIS — E1142 Type 2 diabetes mellitus with diabetic polyneuropathy: Secondary | ICD-10-CM | POA: Diagnosis not present

## 2023-05-25 DIAGNOSIS — I700 Atherosclerosis of aorta: Secondary | ICD-10-CM | POA: Diagnosis not present

## 2023-05-25 DIAGNOSIS — Z23 Encounter for immunization: Secondary | ICD-10-CM | POA: Diagnosis not present

## 2023-05-25 DIAGNOSIS — Z1339 Encounter for screening examination for other mental health and behavioral disorders: Secondary | ICD-10-CM | POA: Diagnosis not present

## 2023-05-25 DIAGNOSIS — N1832 Chronic kidney disease, stage 3b: Secondary | ICD-10-CM | POA: Diagnosis not present

## 2023-05-25 DIAGNOSIS — E669 Obesity, unspecified: Secondary | ICD-10-CM | POA: Diagnosis not present

## 2023-05-25 DIAGNOSIS — F333 Major depressive disorder, recurrent, severe with psychotic symptoms: Secondary | ICD-10-CM | POA: Diagnosis not present

## 2023-05-25 DIAGNOSIS — R82998 Other abnormal findings in urine: Secondary | ICD-10-CM | POA: Diagnosis not present

## 2023-07-07 ENCOUNTER — Other Ambulatory Visit: Payer: Medicare HMO

## 2023-07-08 ENCOUNTER — Ambulatory Visit: Payer: Medicare HMO

## 2023-07-08 ENCOUNTER — Encounter: Payer: Self-pay | Admitting: Podiatry

## 2023-07-08 ENCOUNTER — Ambulatory Visit: Payer: Medicare HMO | Admitting: Podiatry

## 2023-07-08 DIAGNOSIS — M2041 Other hammer toe(s) (acquired), right foot: Secondary | ICD-10-CM | POA: Diagnosis not present

## 2023-07-08 DIAGNOSIS — E1151 Type 2 diabetes mellitus with diabetic peripheral angiopathy without gangrene: Secondary | ICD-10-CM | POA: Diagnosis not present

## 2023-07-08 DIAGNOSIS — E1142 Type 2 diabetes mellitus with diabetic polyneuropathy: Secondary | ICD-10-CM

## 2023-07-08 DIAGNOSIS — M2042 Other hammer toe(s) (acquired), left foot: Secondary | ICD-10-CM | POA: Diagnosis not present

## 2023-07-08 DIAGNOSIS — B351 Tinea unguium: Secondary | ICD-10-CM

## 2023-07-08 DIAGNOSIS — M79674 Pain in right toe(s): Secondary | ICD-10-CM | POA: Diagnosis not present

## 2023-07-08 DIAGNOSIS — M79675 Pain in left toe(s): Secondary | ICD-10-CM

## 2023-07-08 NOTE — Progress Notes (Signed)
Patient presents to the office today for diabetic shoe and insole measuring.  Patient was measured with brannock device to determine size and width for 1 pair of extra depth shoes and will receive 3 pr of custom Diabetic insoles.   Documentation of medical necessity will be sent to patient's treating diabetic doctor to verify and sign.  Patient's diabetic provider: Adrian Prince   Shoes and insoles will be ordered at that time and patient will be notified for an appointment for fitting when they arrive.  USING SCANS ST HAS ON FILE ALREADY  Shoe size (per patient): 11 Brannock measurement: 11 Patient shoe selection- Shoe choice:   [id number]  Shoe size ordered: [id number]  ABN and financial forms signed

## 2023-07-09 NOTE — Progress Notes (Signed)
Subjective:   Patient ID: Jenny Glover, female   DOB: 77 y.o.   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  At this point we are going to order diabetic shoes for her as I think this will do the best job to offload weight and keep these from getting worse and patient will do daily inspections of her feet

## 2023-07-12 DIAGNOSIS — H25813 Combined forms of age-related cataract, bilateral: Secondary | ICD-10-CM | POA: Diagnosis not present

## 2023-07-12 DIAGNOSIS — E119 Type 2 diabetes mellitus without complications: Secondary | ICD-10-CM | POA: Diagnosis not present

## 2023-07-12 DIAGNOSIS — H5203 Hypermetropia, bilateral: Secondary | ICD-10-CM | POA: Diagnosis not present

## 2023-07-12 DIAGNOSIS — H524 Presbyopia: Secondary | ICD-10-CM | POA: Diagnosis not present

## 2023-08-26 ENCOUNTER — Telehealth: Payer: Self-pay | Admitting: Podiatry

## 2023-08-26 NOTE — Telephone Encounter (Signed)
Pts husband called checking on diabetic shoes.  I checked [employer] and the order is there we are just waiting on the paperwork to be sent back from pcp. [employer] has faxed it 6 times. I told pts husband I would refax the paperwork now and that way they had it when he called.

## 2023-09-23 DIAGNOSIS — N1832 Chronic kidney disease, stage 3b: Secondary | ICD-10-CM | POA: Diagnosis not present

## 2023-09-23 DIAGNOSIS — Z794 Long term (current) use of insulin: Secondary | ICD-10-CM | POA: Diagnosis not present

## 2023-09-23 DIAGNOSIS — F333 Major depressive disorder, recurrent, severe with psychotic symptoms: Secondary | ICD-10-CM | POA: Diagnosis not present

## 2023-09-23 DIAGNOSIS — I5032 Chronic diastolic (congestive) heart failure: Secondary | ICD-10-CM | POA: Diagnosis not present

## 2023-09-23 DIAGNOSIS — F01B11 Vascular dementia, moderate, with agitation: Secondary | ICD-10-CM | POA: Diagnosis not present

## 2023-09-23 DIAGNOSIS — M25532 Pain in left wrist: Secondary | ICD-10-CM | POA: Diagnosis not present

## 2023-09-23 DIAGNOSIS — I130 Hypertensive heart and chronic kidney disease with heart failure and stage 1 through stage 4 chronic kidney disease, or unspecified chronic kidney disease: Secondary | ICD-10-CM | POA: Diagnosis not present

## 2023-09-23 DIAGNOSIS — E1142 Type 2 diabetes mellitus with diabetic polyneuropathy: Secondary | ICD-10-CM | POA: Diagnosis not present

## 2023-10-19 NOTE — Progress Notes (Signed)
PUDS

## 2023-10-27 ENCOUNTER — Telehealth: Payer: Self-pay

## 2023-10-27 NOTE — Telephone Encounter (Signed)
Calling pt to update on dm shoes, fax number was wrong icalled the doctor office and the fax number for [facility] [phone]

## 2023-12-17 ENCOUNTER — Telehealth: Payer: Self-pay | Admitting: Podiatry

## 2023-12-17 NOTE — Telephone Encounter (Signed)
 Checking on the status of diabetic shoes. Look like a fax was sent back in Feb. Please call Pt w/ update. Husband states they have been waiting since Dec 2024. TY

## 2024-01-03 ENCOUNTER — Encounter: Payer: Self-pay | Admitting: Cardiovascular Disease

## 2024-01-03 ENCOUNTER — Ambulatory Visit: Attending: Cardiovascular Disease | Admitting: Cardiovascular Disease

## 2024-01-03 VITALS — BP 112/56 | HR 62 | Ht 69.0 in | Wt 224.2 lb

## 2024-01-03 DIAGNOSIS — I4819 Other persistent atrial fibrillation: Secondary | ICD-10-CM

## 2024-01-03 DIAGNOSIS — I350 Nonrheumatic aortic (valve) stenosis: Secondary | ICD-10-CM | POA: Diagnosis not present

## 2024-01-03 DIAGNOSIS — I2510 Atherosclerotic heart disease of native coronary artery without angina pectoris: Secondary | ICD-10-CM

## 2024-01-03 DIAGNOSIS — I10 Essential (primary) hypertension: Secondary | ICD-10-CM | POA: Diagnosis not present

## 2024-01-03 DIAGNOSIS — I5032 Chronic diastolic (congestive) heart failure: Secondary | ICD-10-CM | POA: Diagnosis not present

## 2024-01-03 NOTE — Progress Notes (Signed)
 Chief Complaint  Patient presents with   Follow-up    Aortic stenosis   History of Present Illness: 78 yo female with history of permanent atrial fibrillation, prior CVA, chronic kidney disease stage 3, diastolic CHF, dementia, sleep apnea, diabetes mellitus, hyperlipidemia, HTN, CAD and severe aortic stenosis who is here today for follow up. I saw her as a new consult in January 2022 to discuss TAVR. She is known to have CAD with prior stenting of the PDA in 2013. Cardiac cath 09/18/20 with mild non-obstructive CAD. She has permanent atrial fibrillation on Xarelto . She had a stroke in 2016. She has been followed for moderate aortic stenosis by Dr. Gordan Latina. She was admitted in November 2021 with dyspnea and was found to have acute CHF. She was diuresed with IV Lasix . Echo November 2021 with LVEF=60-65%, mild LVH. Severe aortic stenosis She was not felt to be a candidate for surgical AVR or TAVR given her advanced dementia and poor overall functional status. She was seen by Dr. Sherene Dilling in the CT surgery office February 2022 and he did not think she was a candidate for TAVR or AVR given her obesity, limited mobility and advanced dementia. He discussed in detail with her family that TAVR and anesthesia may worsen her dementia.Her family agreed that her dementia had worsened and we should not proceed with any invasive procedures.   She is here today for follow up. The patient denies any chest pain, dyspnea, palpitations, lower extremity edema, orthopnea, PND, dizziness, near syncope or syncope.   Primary Care Physician: Rosslyn Coons, MD  Past Medical History:  Diagnosis Date   Anxiety    Aortic stenosis    Atrial fibrillation [facility])    s/p DCCV 2005, 2006. Placed on Tikosyn 2006 (negative adenosine cardiolite  03/2005, could not measure EF).   CAD (coronary artery disease)    CHF (congestive heart failure) (HCC)    Depression    Diabetes mellitus without complication (HCC)    Diabetic foot  ulcer (HCC)    High cholesterol    History of cardioversion 2005/2006   Hyperlipemia    Hypertension    Mental status change    Neuropathy    Obesity    Skin cancer of face    treated with radiation   Sleep apnea     Past Surgical History:  Procedure Laterality Date   INTRAMEDULLARY (IM) NAIL INTERTROCHANTERIC Right 08/19/2019   Procedure: INTRAMEDULLARY (IM) NAIL INTERTROCHANTRIC;  Surgeon: Adonica Hoose, MD;  Location: [facility] OR;  Service: Orthopedics;  Laterality: Right;   LEFT HEART CATHETERIZATION WITH CORONARY ANGIOGRAM N/A 08/23/2012   Procedure: LEFT HEART CATHETERIZATION WITH CORONARY ANGIOGRAM;  Surgeon: Lucendia Rusk, MD;  Location: [facility] CATH LAB;  Service: Cardiovascular;  Laterality: N/A;   PERCUTANEOUS CORONARY STENT INTERVENTION (PCI-S)  08/23/2012   Procedure: PERCUTANEOUS CORONARY STENT INTERVENTION (PCI-S);  Surgeon: Lucendia Rusk, MD;  Location: [facility] CATH LAB;  Service: Cardiovascular;;   RIGHT HEART CATH AND CORONARY ANGIOGRAPHY N/A 09/18/2020   Procedure: RIGHT HEART CATH AND CORONARY ANGIOGRAPHY;  Surgeon: Darlis Eisenmenger, MD;  Location: [facility] INVASIVE CV LAB;  Service: Cardiovascular;  Laterality: N/A;   SKIN CANCER EXCISION     TENOTOMY      Current Outpatient Medications  Medication Sig Dispense Refill   acetaminophen  (TYLENOL ) 500 MG tablet Take 1 tablet (500 mg total) by mouth every 6 (six) hours as needed (pain). (Patient taking differently: Take 1,000 mg by mouth every 6 (six) hours as needed (pain).)  30 tablet 0   amLODipine  (NORVASC ) 5 MG tablet Take 5 mg by mouth daily.     atorvastatin  (LIPITOR ) 80 MG tablet TAKE 1 TABLET BY MOUTH ONCE DAILY . APPOINTMENT REQUIRED FOR FUTURE REFILLS (Patient taking differently: Take 40 mg by mouth daily.) 30 tablet 0   Cholecalciferol  (VITAMIN D ) 50 MCG (2000 UT) tablet Take 2,000 Units by mouth daily.     escitalopram  (LEXAPRO ) 10 MG tablet Take 10 mg by mouth daily.     furosemide  (LASIX ) 40 MG tablet TAKE 1  TABLET BY MOUTH IN THE MORNING AND 1/2 (ONE-HALF) TABLET IN THE EVENING 45 tablet 0   insulin  aspart (NOVOLOG ) 100 UNIT/ML injection Inject 10 Units into the skin every morning.     insulin  glargine (LANTUS ) 100 UNIT/ML injection Inject 50 Units into the skin daily.     memantine  (NAMENDA ) 5 MG tablet Take 5 mg 2 (two) times daily by mouth.     metoprolol  succinate (TOPROL -XL) 100 MG 24 hr tablet Take 100 mg by mouth daily. Take with or immediately following a meal.     Rivaroxaban  (XARELTO ) 20 MG TABS Take 1 tablet (20 mg total) by mouth daily. 30 tablet 0   No current facility-administered medications for this visit.    No Known Allergies  Social History   Socioeconomic History   Marital status: Married    Spouse name: Not on file   Number of children: 3   Years of education: College    Highest education level: Not on file  Occupational History   Occupation: retired Engineer, civil (consulting)  Tobacco Use   Smoking status: Never   Smokeless tobacco: Never  Vaping Use   Vaping status: Never Used  Substance and Sexual Activity   Alcohol  use: No    Alcohol /week: 0.0 standard drinks of alcohol    Drug use: No   Sexual activity: Not on file  Other Topics Concern   Not on file  Social History Narrative   Occasionally drinks coffee, tea, soda    Social Drivers of Corporate investment banker Strain: Not on file  Food Insecurity: Not on file  Transportation Needs: Not on file  Physical Activity: Not on file  Stress: Not on file  Social Connections: Not on file  Intimate Partner Violence: Not on file    Family History  Problem Relation Age of Onset   Diabetes Father    Coronary artery disease Father    Stroke Mother     Review of Systems:  As stated in the HPI and otherwise negative.   BP (!) 112/56   Pulse 62   Ht 5\' 9"  (1.753 m)   Wt 101.7 kg   SpO2 (!) 89%   BMI 33.11 kg/m   Physical Examination: General: Well developed, well nourished, NAD  HEENT: OP clear, mucus membranes  moist  SKIN: warm, dry. No rashes. Neuro: No focal deficits  Musculoskeletal: Muscle strength 5/5 all ext  Psychiatric: Mood and affect normal  Neck: No JVD, no carotid bruits, no thyromegaly, no lymphadenopathy.  Lungs:Clear bilaterally, no wheezes, rhonci, crackles Cardiovascular: Irreg irreg. Systolic murmur.   Abdomen:Soft. Bowel sounds present. Non-tender.  Extremities: No lower extremity edema. Pulses are 2 + in the bilateral DP/PT.  EKG:  EKG is ordered today. The ekg ordered today demonstrates   EKG Interpretation Date/Time:  Monday January 03 2024 14:58:07 EDT Ventricular Rate:  56 PR Interval:    QRS Duration:  100 QT Interval:  438 QTC Calculation: 422 R Axis:   -  63  Text Interpretation: Atrial fibrillation Left axis deviation Moderate voltage criteria for LVH, may be normal variant ( R in aVL , Cornell product ) Confirmed by Antoinette Batman [phone]) on 01/03/2024 3:16:27 PM    Echo 08/09/20:  1. Left ventricular ejection fraction, by estimation, is 60 to 65%. The  left ventricle has normal function. The left ventricle has no regional  wall motion abnormalities. There is mild concentric left ventricular  hypertrophy. Left ventricular diastolic  function could not be evaluated.   2. Right ventricular systolic function is normal. The right ventricular  size is normal. There is severely elevated pulmonary artery systolic  pressure.   3. Left atrial size was severely dilated.   4. The mitral valve is normal in structure. Mild mitral valve  regurgitation. No evidence of mitral stenosis.   5. The aortic valve is tricuspid. There is moderate calcification of the  aortic valve. There is severe thickening of the aortic valve. Aortic valve  regurgitation is trivial. Severe aortic valve stenosis.   6. The inferior vena cava is dilated in size with <50% respiratory  variability, suggesting right atrial pressure of 15 mmHg.   Comparison(s): Prior images reviewed side by  side. Aortic stenosis has  worsened and is now severe.   FINDINGS   Left Ventricle: Left ventricular ejection fraction, by estimation, is 60  to 65%. The left ventricle has normal function. The left ventricle has no  regional wall motion abnormalities. The left ventricular internal cavity  size was normal in size. There is   mild concentric left ventricular hypertrophy. Left ventricular diastolic  function could not be evaluated due to atrial fibrillation. Left  ventricular diastolic function could not be evaluated.   Right Ventricle: The right ventricular size is normal. No increase in  right ventricular wall thickness. Right ventricular systolic function is  normal. There is severely elevated pulmonary artery systolic pressure. The  tricuspid regurgitant velocity is  3.76 m/s, and with an assumed right atrial pressure of 15 mmHg, the  estimated right ventricular systolic pressure is 71.6 mmHg.   Left Atrium: Left atrial size was severely dilated.   Right Atrium: Right atrial size was normal in size.   Pericardium: There is no evidence of pericardial effusion.   Mitral Valve: The mitral valve is normal in structure. Mild to moderate  mitral annular calcification. Mild mitral valve regurgitation. No evidence  of mitral valve stenosis.   Tricuspid Valve: The tricuspid valve is normal in structure. Tricuspid  valve regurgitation is trivial.   Aortic Valve: The aortic valve is tricuspid. There is moderate  calcification of the aortic valve. There is severe thickening of the  aortic valve. Aortic valve regurgitation is trivial. Aortic regurgitation  PHT measures 561 msec. Severe aortic stenosis is   present. Aortic valve mean gradient measures 34.8 mmHg. Aortic valve peak  gradient measures 61.8 mmHg. Aortic valve area, by VTI measures 0.79 cm.   Pulmonic Valve: The pulmonic valve was grossly normal. Pulmonic valve  regurgitation is trivial.   Aorta: The aortic root is normal  in size and structure.   Venous: The inferior vena cava is dilated in size with less than 50%  respiratory variability, suggesting right atrial pressure of 15 mmHg.   IAS/Shunts: No atrial level shunt detected by color flow Doppler.      LEFT VENTRICLE  PLAX 2D  LVIDd:         4.70 cm  LVIDs:         2.50  cm  LV PW:         1.30 cm  LV IVS:        1.40 cm  LVOT diam:     2.00 cm  LV SV:         84  LV SV Index:   37  LVOT Area:     3.14 cm      RIGHT VENTRICLE  RV S prime:     13.80 cm/s  TAPSE (M-mode): 2.0 cm   LEFT ATRIUM              Index       RIGHT ATRIUM           Index  LA diam:        4.30 cm  1.90 cm/m  RA Area:     25.80 cm  LA Vol (A2C):   121.0 ml 53.33 ml/m RA Volume:   80.70 ml  35.57 ml/m  LA Vol (A4C):   112.0 ml 49.36 ml/m  LA Biplane Vol: 118.0 ml 52.01 ml/m   AORTIC VALVE  AV Area (Vmax):    0.76 cm  AV Area (Vmean):   0.85 cm  AV Area (VTI):     0.79 cm  AV Vmax:           393.00 cm/s  AV Vmean:          260.726 cm/s  AV VTI:            1.057 m  AV Peak Grad:      61.8 mmHg  AV Mean Grad:      34.8 mmHg  LVOT Vmax:         94.60 cm/s  LVOT Vmean:        70.900 cm/s  LVOT VTI:          0.267 m  LVOT/AV VTI ratio: 0.25  AI PHT:            561 msec     AORTA  Ao Root diam: 3.80 cm   MITRAL VALVE                TRICUSPID VALVE  MV Area (PHT): 3.72 cm     TR Peak grad:   56.6 mmHg  MV Decel Time: 204 msec     TR Vmax:        376.00 cm/s  MV E velocity: 180.00 cm/s                              SHUNTS                              Systemic VTI:  0.27 m                              Systemic Diam: 2.00 cm   Cardiac cath 09/18/20:  1. Nonobstructive coronary disease.  2. Clear severe AS on echo, aortic valve not crossed.  3. Mildly elevated PCWP with prominent v-waves (suspect due to diastolic dysfunction).  4. Moderate pulmonary venous hypertension.  Diagnostic   Left Main  No significant disease.  Left Anterior Descending   Luminal irregularities in the LAD. 60% stenosis in the mid D1, moderate vessel.  Left Circumflex  Luminal irregularities.  Right Coronary Artery  30-40% mid RCA stenosis. Stent in  proximal PDA with 30% in-stent restenosis.    Right Heart Pressures RHC Procedural Findings: Hemodynamics (mmHg) RA mean 4 RV 56/2 PA 57/17, mean 34 PCWP mean 19 (prominent v-waves to 35 mmHg)  Oxygen saturations: PA 69% AO 100%  Cardiac Output (Fick) 5.88  Cardiac Index (Fick) 2.56 PVR 2.6 WU     Recent Labs: No results found for requested labs within last 365 days.    Wt Readings from Last 3 Encounters:  01/03/24 101.7 kg  10/29/22 107.9 kg  10/06/21 110.4 kg    Assessment and Plan:   1. Severe Aortic Valve Stenosis: She has severe aortic stenosis. She is not a candidate for TAVR given advanced dementia and poor functional status. She continues to do well with conservative approach to her AS.   2. Chronic diastolic CHF: Weight is stable. No volume load on exam. Continue Lasix  40 mg am and 20 mg as needed at night.   3. HTN: BP is controlled. Continue current therapy  4. Persistent atrial fibrillation: Rate controlled atrial fib today. Continue Toprol  and Xarelto . Would stop Xarelto  if falls increase.   5. CAD without angina: Stable CAD by cath in January 2022. Continue beta blocker and statin.   Labs/ tests ordered today include:   Orders Placed This Encounter  Procedures   EKG 12-Lead   Disposition:   F/U with me one year   Signed, Antoinette Batman, MD 01/03/2024 3:31 PM    [facility] [address] [place], [place], [place]  [address] Phone: [phone]; Fax: [phone]

## 2024-01-03 NOTE — Patient Instructions (Addendum)
 Medication Instructions:  Your physician recommends that you continue on your current medications as directed. Please refer to the Current Medication list given to you today.  *If you need a refill on your cardiac medications before your next appointment, please call your pharmacy*  Lab Work: If you have labs (blood work) drawn today and your tests are completely normal, you will receive your results only by: MyChart Message (if you have MyChart) OR A paper copy in the mail If you have any lab test that is abnormal or we need to change your treatment, we will call you to review the results.  Testing/Procedures: None ordered today.  Follow-Up: At [facility], you and your health needs are our priority.  As part of our continuing mission to provide you with exceptional heart care, our providers are all part of one team.  This team includes your primary Cardiologist (physician) and Advanced Practice Providers or APPs (Physician Assistants and Nurse Practitioners) who all work together to provide you with the care you need, when you need it.  Your next appointment:   6 month  Provider:   Antoinette Batman, MD     We recommend signing up for the patient portal called "MyChart".  Sign up information is provided on this After Visit Summary.  MyChart is used to connect with patients for Virtual Visits (Telemedicine).  Patients are able to view lab/test results, encounter notes, upcoming appointments, etc.  Non-urgent messages can be sent to your provider as well.   To learn more about what you can do with MyChart, go to [email or web address].   Other Instructions      1st Floor: - Lobby - Registration  - Pharmacy  - Lab - Cafe  2nd Floor: - PV Lab - Diagnostic Testing (echo, CT, nuclear med)  3rd Floor: - Vacant  4th Floor: - TCTS (cardiothoracic surgery) - AFib Clinic - Structural Heart Clinic - Vascular Surgery  - Vascular Ultrasound  5th Floor: -  HeartCare Cardiology (general and EP) - Clinical Pharmacy for coumadin, hypertension, lipid, weight-loss medications, and med management appointments    Valet parking services will be available as well.

## 2024-01-17 DIAGNOSIS — F01B11 Vascular dementia, moderate, with agitation: Secondary | ICD-10-CM | POA: Diagnosis not present

## 2024-01-17 DIAGNOSIS — I5032 Chronic diastolic (congestive) heart failure: Secondary | ICD-10-CM | POA: Diagnosis not present

## 2024-01-17 DIAGNOSIS — N1832 Chronic kidney disease, stage 3b: Secondary | ICD-10-CM | POA: Diagnosis not present

## 2024-01-17 DIAGNOSIS — I2510 Atherosclerotic heart disease of native coronary artery without angina pectoris: Secondary | ICD-10-CM | POA: Diagnosis not present

## 2024-01-17 DIAGNOSIS — E1142 Type 2 diabetes mellitus with diabetic polyneuropathy: Secondary | ICD-10-CM | POA: Diagnosis not present

## 2024-01-17 DIAGNOSIS — I480 Paroxysmal atrial fibrillation: Secondary | ICD-10-CM | POA: Diagnosis not present

## 2024-01-17 DIAGNOSIS — I130 Hypertensive heart and chronic kidney disease with heart failure and stage 1 through stage 4 chronic kidney disease, or unspecified chronic kidney disease: Secondary | ICD-10-CM | POA: Diagnosis not present

## 2024-01-17 DIAGNOSIS — M25531 Pain in right wrist: Secondary | ICD-10-CM | POA: Diagnosis not present

## 2024-01-19 ENCOUNTER — Telehealth: Payer: Self-pay

## 2024-01-19 NOTE — Telephone Encounter (Signed)
 Received a call from [facility] at Dr. Abelina Hoes office [facility]), asking about the status of Diabetic shoes for Alix Aquas

## 2024-01-24 NOTE — Telephone Encounter (Signed)
 Please find this out if you can

## 2024-01-26 ENCOUNTER — Telehealth: Payer: Self-pay

## 2024-01-26 NOTE — Telephone Encounter (Signed)
 Re-faxed DM shoe paper work to Dr. Abelina Hoes office attn: Winifred Haven

## 2024-02-02 ENCOUNTER — Telehealth: Payer: Self-pay

## 2024-02-02 DIAGNOSIS — E1151 Type 2 diabetes mellitus with diabetic peripheral angiopathy without gangrene: Secondary | ICD-10-CM

## 2024-02-02 NOTE — Telephone Encounter (Signed)
 New order placed with [facility] today as treating Dr Nobie Batch received and faxed to [facility] as well

## 2024-03-01 ENCOUNTER — Telehealth: Payer: Self-pay

## 2024-03-01 NOTE — Telephone Encounter (Signed)
 Pts spouse LVM- an appt does need to be made for the PU. If they do call back please schedule it.. thank you!

## 2024-03-01 NOTE — Telephone Encounter (Signed)
 LVM to schedule DM shoe pick up

## 2024-03-02 DIAGNOSIS — D225 Melanocytic nevi of trunk: Secondary | ICD-10-CM | POA: Diagnosis not present

## 2024-03-02 DIAGNOSIS — C4441 Basal cell carcinoma of skin of scalp and neck: Secondary | ICD-10-CM | POA: Diagnosis not present

## 2024-03-02 DIAGNOSIS — L821 Other seborrheic keratosis: Secondary | ICD-10-CM | POA: Diagnosis not present

## 2024-03-02 DIAGNOSIS — C44319 Basal cell carcinoma of skin of other parts of face: Secondary | ICD-10-CM | POA: Diagnosis not present

## 2024-03-02 DIAGNOSIS — L820 Inflamed seborrheic keratosis: Secondary | ICD-10-CM | POA: Diagnosis not present

## 2024-03-08 ENCOUNTER — Ambulatory Visit (INDEPENDENT_AMBULATORY_CARE_PROVIDER_SITE_OTHER)

## 2024-03-08 DIAGNOSIS — E1142 Type 2 diabetes mellitus with diabetic polyneuropathy: Secondary | ICD-10-CM | POA: Diagnosis not present

## 2024-03-08 DIAGNOSIS — M2142 Flat foot [pes planus] (acquired), left foot: Secondary | ICD-10-CM

## 2024-03-08 DIAGNOSIS — M2041 Other hammer toe(s) (acquired), right foot: Secondary | ICD-10-CM | POA: Diagnosis not present

## 2024-03-08 DIAGNOSIS — M2141 Flat foot [pes planus] (acquired), right foot: Secondary | ICD-10-CM | POA: Diagnosis not present

## 2024-03-08 DIAGNOSIS — M2042 Other hammer toe(s) (acquired), left foot: Secondary | ICD-10-CM

## 2024-03-08 NOTE — Progress Notes (Signed)

## 2024-04-06 DIAGNOSIS — C44319 Basal cell carcinoma of skin of other parts of face: Secondary | ICD-10-CM | POA: Diagnosis not present

## 2024-04-06 DIAGNOSIS — Z08 Encounter for follow-up examination after completed treatment for malignant neoplasm: Secondary | ICD-10-CM | POA: Diagnosis not present

## 2024-04-06 DIAGNOSIS — Z85828 Personal history of other malignant neoplasm of skin: Secondary | ICD-10-CM | POA: Diagnosis not present

## 2024-05-11 DIAGNOSIS — L820 Inflamed seborrheic keratosis: Secondary | ICD-10-CM | POA: Diagnosis not present

## 2024-05-11 DIAGNOSIS — L821 Other seborrheic keratosis: Secondary | ICD-10-CM | POA: Diagnosis not present

## 2024-05-11 DIAGNOSIS — C4441 Basal cell carcinoma of skin of scalp and neck: Secondary | ICD-10-CM | POA: Diagnosis not present

## 2024-05-11 DIAGNOSIS — Z85828 Personal history of other malignant neoplasm of skin: Secondary | ICD-10-CM | POA: Diagnosis not present

## 2024-05-11 DIAGNOSIS — Z08 Encounter for follow-up examination after completed treatment for malignant neoplasm: Secondary | ICD-10-CM | POA: Diagnosis not present

## 2024-05-23 DIAGNOSIS — Z1212 Encounter for screening for malignant neoplasm of rectum: Secondary | ICD-10-CM | POA: Diagnosis not present

## 2024-05-23 DIAGNOSIS — I130 Hypertensive heart and chronic kidney disease with heart failure and stage 1 through stage 4 chronic kidney disease, or unspecified chronic kidney disease: Secondary | ICD-10-CM | POA: Diagnosis not present

## 2024-05-23 DIAGNOSIS — E559 Vitamin D deficiency, unspecified: Secondary | ICD-10-CM | POA: Diagnosis not present

## 2024-05-23 DIAGNOSIS — D649 Anemia, unspecified: Secondary | ICD-10-CM | POA: Diagnosis not present

## 2024-05-23 DIAGNOSIS — E1142 Type 2 diabetes mellitus with diabetic polyneuropathy: Secondary | ICD-10-CM | POA: Diagnosis not present

## 2024-05-23 DIAGNOSIS — E785 Hyperlipidemia, unspecified: Secondary | ICD-10-CM | POA: Diagnosis not present

## 2024-05-23 DIAGNOSIS — N1832 Chronic kidney disease, stage 3b: Secondary | ICD-10-CM | POA: Diagnosis not present

## 2024-05-23 DIAGNOSIS — Z0189 Encounter for other specified special examinations: Secondary | ICD-10-CM | POA: Diagnosis not present

## 2024-05-23 DIAGNOSIS — I5032 Chronic diastolic (congestive) heart failure: Secondary | ICD-10-CM | POA: Diagnosis not present

## 2024-05-30 DIAGNOSIS — Z794 Long term (current) use of insulin: Secondary | ICD-10-CM | POA: Diagnosis not present

## 2024-05-30 DIAGNOSIS — I700 Atherosclerosis of aorta: Secondary | ICD-10-CM | POA: Diagnosis not present

## 2024-05-30 DIAGNOSIS — I10 Essential (primary) hypertension: Secondary | ICD-10-CM | POA: Diagnosis not present

## 2024-05-30 DIAGNOSIS — E785 Hyperlipidemia, unspecified: Secondary | ICD-10-CM | POA: Diagnosis not present

## 2024-05-30 DIAGNOSIS — Z0000 Encounter for general adult medical examination without abnormal findings: Secondary | ICD-10-CM | POA: Diagnosis not present

## 2024-05-30 DIAGNOSIS — E1142 Type 2 diabetes mellitus with diabetic polyneuropathy: Secondary | ICD-10-CM | POA: Diagnosis not present

## 2024-05-30 DIAGNOSIS — N1832 Chronic kidney disease, stage 3b: Secondary | ICD-10-CM | POA: Diagnosis not present

## 2024-05-30 DIAGNOSIS — R82998 Other abnormal findings in urine: Secondary | ICD-10-CM | POA: Diagnosis not present

## 2024-05-30 DIAGNOSIS — I130 Hypertensive heart and chronic kidney disease with heart failure and stage 1 through stage 4 chronic kidney disease, or unspecified chronic kidney disease: Secondary | ICD-10-CM | POA: Diagnosis not present

## 2024-05-30 DIAGNOSIS — E669 Obesity, unspecified: Secondary | ICD-10-CM | POA: Diagnosis not present

## 2024-05-30 DIAGNOSIS — Z23 Encounter for immunization: Secondary | ICD-10-CM | POA: Diagnosis not present

## 2024-05-30 DIAGNOSIS — I5032 Chronic diastolic (congestive) heart failure: Secondary | ICD-10-CM | POA: Diagnosis not present

## 2024-05-30 DIAGNOSIS — I480 Paroxysmal atrial fibrillation: Secondary | ICD-10-CM | POA: Diagnosis not present

## 2024-05-30 DIAGNOSIS — Z1331 Encounter for screening for depression: Secondary | ICD-10-CM | POA: Diagnosis not present

## 2024-06-15 DIAGNOSIS — Z85828 Personal history of other malignant neoplasm of skin: Secondary | ICD-10-CM | POA: Diagnosis not present

## 2024-06-15 DIAGNOSIS — Z08 Encounter for follow-up examination after completed treatment for malignant neoplasm: Secondary | ICD-10-CM | POA: Diagnosis not present

## 2024-07-29 ENCOUNTER — Emergency Department (HOSPITAL_COMMUNITY)

## 2024-07-29 ENCOUNTER — Encounter (HOSPITAL_COMMUNITY): Payer: Self-pay | Admitting: Emergency Medicine

## 2024-07-29 ENCOUNTER — Other Ambulatory Visit: Payer: Self-pay

## 2024-07-29 ENCOUNTER — Emergency Department (HOSPITAL_COMMUNITY)
Admission: EM | Admit: 2024-07-29 | Discharge: 2024-07-30 | Disposition: A | Discharge status: Home / Self Care | Attending: Emergency Medicine | Admitting: Emergency Medicine

## 2024-07-29 DIAGNOSIS — R0689 Other abnormalities of breathing: Secondary | ICD-10-CM | POA: Diagnosis not present

## 2024-07-29 DIAGNOSIS — M47812 Spondylosis without myelopathy or radiculopathy, cervical region: Secondary | ICD-10-CM | POA: Diagnosis not present

## 2024-07-29 DIAGNOSIS — W19XXXA Unspecified fall, initial encounter: Secondary | ICD-10-CM | POA: Diagnosis not present

## 2024-07-29 DIAGNOSIS — G9389 Other specified disorders of brain: Secondary | ICD-10-CM | POA: Diagnosis not present

## 2024-07-29 DIAGNOSIS — S0101XA Laceration without foreign body of scalp, initial encounter: Secondary | ICD-10-CM | POA: Diagnosis not present

## 2024-07-29 DIAGNOSIS — W1830XA Fall on same level, unspecified, initial encounter: Secondary | ICD-10-CM | POA: Diagnosis not present

## 2024-07-29 DIAGNOSIS — I10 Essential (primary) hypertension: Secondary | ICD-10-CM | POA: Diagnosis not present

## 2024-07-29 DIAGNOSIS — R410 Disorientation, unspecified: Secondary | ICD-10-CM | POA: Diagnosis not present

## 2024-07-29 DIAGNOSIS — R001 Bradycardia, unspecified: Secondary | ICD-10-CM | POA: Diagnosis not present

## 2024-07-29 DIAGNOSIS — M1612 Unilateral primary osteoarthritis, left hip: Secondary | ICD-10-CM | POA: Diagnosis not present

## 2024-07-29 DIAGNOSIS — I4891 Unspecified atrial fibrillation: Secondary | ICD-10-CM | POA: Diagnosis not present

## 2024-07-29 DIAGNOSIS — Z043 Encounter for examination and observation following other accident: Secondary | ICD-10-CM | POA: Diagnosis not present

## 2024-07-29 DIAGNOSIS — Z9889 Other specified postprocedural states: Secondary | ICD-10-CM | POA: Diagnosis not present

## 2024-07-29 DIAGNOSIS — S299XXA Unspecified injury of thorax, initial encounter: Secondary | ICD-10-CM | POA: Diagnosis not present

## 2024-07-29 DIAGNOSIS — I517 Cardiomegaly: Secondary | ICD-10-CM | POA: Diagnosis not present

## 2024-07-29 DIAGNOSIS — M4312 Spondylolisthesis, cervical region: Secondary | ICD-10-CM | POA: Diagnosis not present

## 2024-07-29 DIAGNOSIS — M4802 Spinal stenosis, cervical region: Secondary | ICD-10-CM | POA: Diagnosis not present

## 2024-07-29 DIAGNOSIS — S0990XA Unspecified injury of head, initial encounter: Secondary | ICD-10-CM | POA: Diagnosis not present

## 2024-07-29 LAB — BASIC METABOLIC PANEL WITH GFR
Anion gap: 9 (ref 5–15)
BUN: 51 mg/dL — ABNORMAL HIGH (ref 8–23)
CO2: 23 mmol/L (ref 22–32)
Calcium: 8.9 mg/dL (ref 8.9–10.3)
Chloride: 105 mmol/L (ref 98–111)
Creatinine, Ser: 1.48 mg/dL — ABNORMAL HIGH (ref 0.44–1.00)
GFR, Estimated: 36 mL/min — ABNORMAL LOW (ref >60)
Glucose, Bld: 142 mg/dL — ABNORMAL HIGH (ref 70–99)
Potassium: 4.6 mmol/L (ref 3.5–5.1)
Sodium: 137 mmol/L (ref 135–145)

## 2024-07-29 LAB — CBC WITH DIFFERENTIAL/PLATELET
Abs Immature Granulocytes: 0.04 K/uL (ref 0.00–0.07)
Basophils Absolute: 0.1 K/uL (ref 0.0–0.1)
Basophils Relative: 1 %
Eosinophils Absolute: 0.6 K/uL — ABNORMAL HIGH (ref 0.0–0.5)
Eosinophils Relative: 6 %
HCT: 40.8 % (ref 36.0–46.0)
Hemoglobin: 12.3 g/dL (ref 12.0–15.0)
Immature Granulocytes: 0 %
Lymphocytes Relative: 15 %
Lymphs Abs: 1.6 K/uL (ref 0.7–4.0)
MCH: 28 pg (ref 26.0–34.0)
MCHC: 30.1 g/dL (ref 30.0–36.0)
MCV: 92.7 fL (ref 80.0–100.0)
Monocytes Absolute: 0.9 K/uL (ref 0.1–1.0)
Monocytes Relative: 8 %
Neutro Abs: 7.4 K/uL (ref 1.7–7.7)
Neutrophils Relative %: 70 %
Platelets: 267 K/uL (ref 150–400)
RBC: 4.4 MIL/uL (ref 3.87–5.11)
RDW: 15.3 % (ref 11.5–15.5)
WBC: 10.5 K/uL (ref 4.0–10.5)
nRBC: 0 % (ref 0.0–0.2)

## 2024-07-29 NOTE — ED Triage Notes (Signed)
 Pt BIB EMS for a fall in the bathroom. She fell back and slightly hit her head on a shelf. Pt on anti coags and denies LOC. Pt AAO x3, with dementia @ baseline, otherwise neurologically in tact.

## 2024-07-29 NOTE — ED Provider Notes (Signed)
 [place] EMERGENCY DEPARTMENT AT [facility] Provider Note   CSN: [id number] Arrival date & time: 07/29/24  2317     History Chief Complaint  Patient presents with   Fall    HPI Jenny Glover is a 78 y.o. female presenting for chief complaint of ground-level fall.  Brought in as a level 2 trauma due to fall out of bed with head injury.  Has a small laceration to the back of the scalp.  Underlying cognitive impairment.  Did endorse left hip pain but otherwise patient has no concerns..   Patient's recorded medical, surgical, social, medication list and allergies were reviewed in the Snapshot window as part of the initial history.   Review of Systems   Review of Systems  Constitutional:  Negative for chills and fever.  HENT:  Negative for ear pain and sore throat.   Eyes:  Negative for pain and visual disturbance.  Respiratory:  Negative for cough and shortness of breath.   Cardiovascular:  Negative for chest pain and palpitations.  Gastrointestinal:  Negative for abdominal pain and vomiting.  Genitourinary:  Negative for dysuria and hematuria.  Musculoskeletal:  Negative for arthralgias and back pain.  Skin:  Negative for color change and rash.  Neurological:  Negative for seizures, syncope and headaches.  All other systems reviewed and are negative.   Physical Exam Updated Vital Signs There were no vitals taken for this visit. Physical Exam Constitutional:      General: She is not in acute distress.    Appearance: She is not ill-appearing or toxic-appearing.  HENT:     Head: Normocephalic and atraumatic.  Eyes:     Extraocular Movements: Extraocular movements intact.     Pupils: Pupils are equal, round, and reactive to light.  Cardiovascular:     Rate and Rhythm: Normal rate.  Pulmonary:     Effort: No respiratory distress.  Abdominal:     General: Abdomen is flat.  Musculoskeletal:        General: Signs of injury (1cm lac to posterior scalp)  present. No swelling or deformity.     Cervical back: Normal range of motion. No rigidity.  Skin:    General: Skin is warm and dry.  Neurological:     General: No focal deficit present.     Mental Status: She is alert and oriented to person, place, and time.  Psychiatric:        Mood and Affect: Mood normal.      ED Course/ Medical Decision Making/ A&P    Procedures Procedures   Medications Ordered in ED Medications - No data to display Medical Decision Making:   Jenny Glover is a 78 y.o. female who presented to the ED today with a fall at their home detailed above. They are on a blood thinner. Handoff received from EMS.  Patient placed on continuous vitals and telemetry monitoring while in ED which was reviewed periodically.   Complete initial physical exam performed, notably the patient  was hemodynamically stable  in no acute distress. No obvious deformities or injuries appreciated on extensive physical exam including active range of motion of all joints.     Reviewed and confirmed nursing documentation for past medical history, family history, social history.    Initial Assessment/Plan:   This is a patient presenting with a moderate blunt mechanism trauma.  As such, I have considered intracranial injuries including intracranial hemorrhage, intrathoracic injuries including blunt myocardial or blunt lung injury, blunt abdominal injuries  including aortic dissection, bladder injury, spleen injury, liver injury and I have considered orthopedic injuries including extremity or spinal injury. This is most consistent with an acute life/limb threatening illness complicated by underlying chronic conditions.  With the patient's presentation of moderate mechanism trauma but an otherwise reassuring exam, patient warrants targeted evaluation for potential traumatic injuries. Will proceed with targeted evaluation for potential injuries. Will proceed with CT Head, Cervical Spine CT, and  Chest/Pelvis XR.   Images reviewed and agree with radiology interpretation.  No results found.  Final Reassessment and Plan:   ***   Disposition:  I have considered need for hospitalization, however, considering all of the above, I believe this patient is stable for discharge at this time.  Patient/family educated about specific return precautions for given chief complaint and symptoms.  Patient/family educated about follow-up with PCP.     Patient/family expressed understanding of return precautions and need for follow-up. Patient spoken to regarding all imaging and laboratory results and appropriate follow up for these results. All education provided in verbal form with additional information in written form. Time was allowed for answering of patient questions. Patient discharged.    Emergency Department Medication Summary:   Medications - No data to display         Clinical Impression: No diagnosis found.   Data Unavailable   Final Clinical Impression(s) / ED Diagnoses Final diagnoses:  None    Rx / DC Orders ED Discharge Orders     None

## 2024-07-29 NOTE — ED Notes (Signed)
 Patient transported to CT with RN

## 2024-07-29 NOTE — Progress Notes (Signed)
   07/29/24 2300  Spiritual Encounters  Type of Visit Initial  Care provided to: Patient  Referral source Trauma page  OnCall Visit Yes    Chaplain was paged to trauma level II. Chaplain was present, listened to her and provided care. Chaplain remains available if needed.   M.Kubra Susanna Kerry Resident [phone]
